# Patient Record
Sex: Male | Born: 1980
Health system: Southern US, Community
[De-identification: ages and names within clinical notes are randomized; demographics above are authoritative.]

## PROBLEM LIST (undated history)

## (undated) DIAGNOSIS — J45909 Unspecified asthma, uncomplicated: Secondary | ICD-10-CM

## (undated) DIAGNOSIS — I10 Essential (primary) hypertension: Secondary | ICD-10-CM

## (undated) DIAGNOSIS — M3390 Dermatopolymyositis, unspecified, organ involvement unspecified: Secondary | ICD-10-CM

## (undated) DIAGNOSIS — M3313 Other dermatomyositis without myopathy: Secondary | ICD-10-CM

## (undated) HISTORY — PX: INCISION AND DRAINAGE OF WOUND: SHX1803

## (undated) HISTORY — PX: TYMPANOSTOMY TUBE PLACEMENT: SHX32

## (undated) HISTORY — PX: HAND SURGERY: SHX662

---
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
## 2004-06-08 ENCOUNTER — Inpatient Hospital Stay (HOSPITAL_COMMUNITY): Admission: AD | Admit: 2004-06-08 | Discharge: 2004-06-11 | Payer: Self-pay | Admitting: Orthopedic Surgery

## 2004-07-07 ENCOUNTER — Encounter: Admission: RE | Admit: 2004-07-07 | Discharge: 2004-08-04 | Payer: Self-pay | Admitting: Orthopedic Surgery

## 2005-01-15 ENCOUNTER — Ambulatory Visit: Payer: Self-pay | Admitting: Internal Medicine

## 2005-04-08 ENCOUNTER — Ambulatory Visit (HOSPITAL_COMMUNITY): Admission: RE | Admit: 2005-04-08 | Discharge: 2005-04-11 | Payer: Self-pay | Admitting: Orthopedic Surgery

## 2005-08-05 ENCOUNTER — Ambulatory Visit: Payer: Self-pay | Admitting: Internal Medicine

## 2006-09-20 ENCOUNTER — Ambulatory Visit: Payer: Self-pay | Admitting: Internal Medicine

## 2007-02-22 ENCOUNTER — Ambulatory Visit: Payer: Self-pay | Admitting: Internal Medicine

## 2007-02-22 ENCOUNTER — Encounter: Payer: Self-pay | Admitting: Internal Medicine

## 2007-02-22 DIAGNOSIS — J45909 Unspecified asthma, uncomplicated: Secondary | ICD-10-CM | POA: Insufficient documentation

## 2007-08-08 ENCOUNTER — Ambulatory Visit: Payer: Self-pay | Admitting: Internal Medicine

## 2007-10-17 ENCOUNTER — Emergency Department (HOSPITAL_COMMUNITY): Admission: EM | Admit: 2007-10-17 | Discharge: 2007-10-17 | Payer: Self-pay | Admitting: Emergency Medicine

## 2008-06-17 ENCOUNTER — Ambulatory Visit: Payer: Self-pay | Admitting: Internal Medicine

## 2008-06-17 DIAGNOSIS — M3390 Dermatopolymyositis, unspecified, organ involvement unspecified: Secondary | ICD-10-CM | POA: Insufficient documentation

## 2008-06-17 DIAGNOSIS — L97909 Non-pressure chronic ulcer of unspecified part of unspecified lower leg with unspecified severity: Secondary | ICD-10-CM | POA: Insufficient documentation

## 2008-06-18 LAB — CONVERTED CEMR LAB
Basophils Absolute: 0.1 10*3/uL (ref 0.0–0.1)
Eosinophils Relative: 2.7 % (ref 0.0–5.0)
HCT: 44 % (ref 39.0–52.0)
Hgb A1c MFr Bld: 5.6 % (ref 4.6–6.0)
Lymphocytes Relative: 16.6 % (ref 12.0–46.0)
Monocytes Relative: 6.8 % (ref 3.0–12.0)
Neutrophils Relative %: 72.6 % (ref 43.0–77.0)
RDW: 11.7 % (ref 11.5–14.6)
WBC: 7.5 10*3/uL (ref 4.5–10.5)

## 2008-08-14 ENCOUNTER — Ambulatory Visit: Payer: Self-pay | Admitting: Internal Medicine

## 2008-08-22 ENCOUNTER — Ambulatory Visit: Payer: Self-pay | Admitting: Internal Medicine

## 2008-08-22 LAB — CONVERTED CEMR LAB: Inflenza A Ag: NEGATIVE

## 2008-09-03 ENCOUNTER — Telehealth (INDEPENDENT_AMBULATORY_CARE_PROVIDER_SITE_OTHER): Payer: Self-pay | Admitting: *Deleted

## 2008-09-04 ENCOUNTER — Telehealth (INDEPENDENT_AMBULATORY_CARE_PROVIDER_SITE_OTHER): Payer: Self-pay | Admitting: *Deleted

## 2009-07-23 ENCOUNTER — Ambulatory Visit: Payer: Self-pay | Admitting: Internal Medicine

## 2010-02-01 ENCOUNTER — Emergency Department (HOSPITAL_COMMUNITY): Admission: EM | Admit: 2010-02-01 | Discharge: 2010-02-01 | Payer: Self-pay | Admitting: Family Medicine

## 2010-02-02 ENCOUNTER — Inpatient Hospital Stay (HOSPITAL_COMMUNITY): Admission: EM | Admit: 2010-02-02 | Discharge: 2010-02-02 | Payer: Self-pay | Admitting: Emergency Medicine

## 2010-03-05 ENCOUNTER — Encounter: Payer: Self-pay | Admitting: Internal Medicine

## 2010-03-24 ENCOUNTER — Encounter: Payer: Self-pay | Admitting: Internal Medicine

## 2010-07-08 ENCOUNTER — Emergency Department (HOSPITAL_COMMUNITY): Admission: EM | Admit: 2010-07-08 | Discharge: 2010-07-08 | Payer: Self-pay | Admitting: Family Medicine

## 2010-07-21 ENCOUNTER — Ambulatory Visit: Payer: Self-pay | Admitting: Internal Medicine

## 2010-10-12 ENCOUNTER — Ambulatory Visit: Payer: Self-pay | Admitting: Family Medicine

## 2010-10-12 ENCOUNTER — Encounter: Payer: Self-pay | Admitting: Family Medicine

## 2010-10-12 DIAGNOSIS — I10 Essential (primary) hypertension: Secondary | ICD-10-CM | POA: Insufficient documentation

## 2010-10-15 ENCOUNTER — Telehealth: Payer: Self-pay | Admitting: Family Medicine

## 2010-10-20 LAB — CONVERTED CEMR LAB
Bilirubin, Direct: 0.1 mg/dL (ref 0.0–0.3)
CO2: 28 meq/L (ref 19–32)
Chloride: 106 meq/L (ref 96–112)
Cholesterol: 131 mg/dL (ref 0–200)
Creatinine, Ser: 1.1 mg/dL (ref 0.4–1.5)
Glucose, Bld: 97 mg/dL (ref 70–99)
HDL: 31.2 mg/dL — ABNORMAL LOW (ref >39.00)
LDL Cholesterol: 90 mg/dL (ref 0–99)
Total Bilirubin: 0.8 mg/dL (ref 0.3–1.2)
Total CHOL/HDL Ratio: 4
Total Protein: 6.8 g/dL (ref 6.0–8.3)
VLDL: 9.4 mg/dL (ref 0.0–40.0)

## 2010-10-29 ENCOUNTER — Ambulatory Visit
Admission: RE | Admit: 2010-10-29 | Discharge: 2010-10-29 | Payer: Self-pay | Source: Home / Self Care | Attending: Internal Medicine | Admitting: Internal Medicine

## 2010-10-29 DIAGNOSIS — K219 Gastro-esophageal reflux disease without esophagitis: Secondary | ICD-10-CM | POA: Insufficient documentation

## 2010-11-03 ENCOUNTER — Encounter: Payer: Self-pay | Admitting: Internal Medicine

## 2010-11-04 ENCOUNTER — Ambulatory Visit
Admission: RE | Admit: 2010-11-04 | Discharge: 2010-11-04 | Payer: Self-pay | Source: Home / Self Care | Attending: Internal Medicine | Admitting: Internal Medicine

## 2010-11-04 ENCOUNTER — Telehealth: Payer: Self-pay | Admitting: Internal Medicine

## 2010-11-24 NOTE — Letter (Signed)
Summary: [facility]  [facility]    Imported By: Lanelle Bal 04/01/2010 12:45:06  _____________________________________________________________________  External Attachment:    Type:   Image     Comment:   External Document

## 2010-11-24 NOTE — Assessment & Plan Note (Signed)
Summary: flu shot/kn  Nurse Visit  CC: Flu shot./kb   Allergies: 1)  ! Sulfa  Orders Added: 1)  Admin 1st Vaccine [90471] 2)  Flu Vaccine 62yrs + [16109]                Flu Vaccine Consent Questions     Do you have a history of severe allergic reactions to this vaccine? no    Any prior history of allergic reactions to egg and/or gelatin? no    Do you have a sensitivity to the preservative Thimersol? no    Do you have a past history of Guillan-Barre Syndrome? no    Do you currently have an acute febrile illness? no    Have you ever had a severe reaction to latex? no    Vaccine information given and explained to patient? yes    Are you currently pregnant? no    Lot Number:[id number]   Exp Date:04/24/2011   Site Given  Left Deltoid IMu Lucious Groves CMA  July 21, 2010 10:45 AM

## 2010-11-26 NOTE — Letter (Signed)
Summary: [facility] Read[facility]  BP Readings/[facility]   Imported By: Lanelle Bal 10/22/2010 09:14:49  _____________________________________________________________________  External Attachment:    Type:   Image     Comment:   External Document

## 2010-11-26 NOTE — Assessment & Plan Note (Signed)
Summary: 2 week followup///sph   Vital Signs:  Patient profile:   30 year old male Weight:      249.6 pounds BMI:     41.69 Temp:     98.3 degrees F oral Pulse rate:   84 / minute Resp:     14 per minute BP sitting:   124 / 76  (right arm) Cuff size:   large  Vitals Entered By: Shonna Chock CMA (October 29, 2010 9:24 AM) CC: Follow-up visit: discuss labs ( copy given)  and cold , Cough Comments Patient compared cuffs: 134/93, p83  (left arm)   Primary Care Provider:  Alfonse Flavors  CC:  Follow-up visit: discuss labs ( copy given)  and cold  and Cough.  History of Present Illness: Cough      This is a 30 year old man who presents with Cough.  The patient reportsparoxysmal  non-productive cough, intermittent wheezing, and exertional dyspnea, but denies shortness of breath, fever, and hemoptysis.  Associated symtpoms include acid reflux symptoms.  The patient denies the following symptoms: cold/URI symptoms and nasal congestion.  The cough is worse with exercise.   Partially effective prior treatments have included PPI, albuterol inhaler, and other asthma medication ( Symbicort ) Cough improved off ACE-I.  Risk factors include history of asthma and history of reflux.   Hypertension Follow-Up      The patient also presents for Hypertension follow-up post change to Amlodipine.  The patient denies lightheadedness, urinary frequency, headaches, edema, and fatigue.  The patient denies the following associated symptoms: chest pain, chest pressure, palpitations, and syncope.  Compliance with medications (by patient report) has been near 100%.  Adjunctive measures currently used by the patient include salt restriction.  BP 130s/90-110.  Current Medications (verified): 1)  Albuterol Mdi Q 4 Hours Prn 2)  Symbicort 160-4.5 Mcg/act Aero (Budesonide-Formoterol Fumarate) .... 2 Puffs Two Times A Day 3)  Prednisone (Pak) 10 Mg Tabs (Prednisone) .... As Directed 4)  Norvasc 5 Mg Tabs (Amlodipine Besylate) ....  Take 1 By Mouth Once Daily  Allergies: 1)  ! Sulfa 2)  ! * Lisinopril  Physical Exam  General:  well-nourished,in no acute distress; alert,appropriate and cooperative throughout examination Nose:  External nasal examination shows no deformity or inflammation. Nasal mucosa are  dry  without lesions or exudates. Mouth:  Oral mucosa and oropharynx without lesions or exudates.  Teeth in good repair. Minimal pharyngeal erythema.  Hoarse Lungs:  Normal respiratory effort, chest expands symmetrically. Lungs are clear to auscultation, no crackles or wheezes but intermittent dry cough Heart:  Normal rate and regular rhythm. S1 and S2 normal without gallop, murmur, click, rub.S4 Abdomen:  Bowel sounds positive,abdomen soft and non-tender without masses, organomegaly or hernias noted. No AAA or bruits Pulses:  R and L carotid,radial,dorsalis pedis and posterior tibial pulses are full and equal bilaterally Extremities:  No clubbing, cyanosis. R ankle edema post strain Neurologic:  alert & oriented X3.     Impression & Recommendations:  Problem # 1:  COUGH (ICD-786.2) probably  due to # 3 & 4   Problem # 2:  HYPERTENSION (ICD-401.9)  His updated medication list for this problem includes:    Norvasc 5 Mg Tabs (Amlodipine besylate) .Marland Kitchen... Take 1 by mouth once daily    Losartan Potassium 100 Mg Tabs (Losartan potassium) .Marland Kitchen... 1/2 once daily if bop averages > 135/85  Problem # 3:  ASTHMA (ICD-493.90)  His updated medication list for this problem includes:  Symbicort 160-4.5 Mcg/act Aero (Budesonide-formoterol fumarate) .Marland Kitchen... 2 puffs two times a day    Prednisone (pak) 10 Mg Tabs (Prednisone) .Marland Kitchen... As directed    Singulair 10 Mg Tabs (Montelukast sodium) .Marland Kitchen... 1 once daily  Problem # 4:  GERD (ICD-530.81)  Complete Medication List: 1)  Albuterol Mdi Q 4 Hours Prn  2)  Symbicort 160-4.5 Mcg/act Aero (Budesonide-formoterol fumarate) .... 2 puffs two times a day 3)  Prednisone (pak) 10 Mg Tabs  (Prednisone) .... As directed 4)  Norvasc 5 Mg Tabs (Amlodipine besylate) .... Take 1 by mouth once daily 5)  Losartan Potassium 100 Mg Tabs (Losartan potassium) .... 1/2 once daily if bop averages > 135/85 6)  Singulair 10 Mg Tabs (Montelukast sodium) .Marland Kitchen.. 1 once daily  Patient Instructions: 1)  Avoid foods high in acid (tomatoes, citrus juices, spicy foods). Avoid eating within two hours of lying down or before exercising. Do not over eat; try smaller more frequent meals. Elevate head of bed twelve inches when sleeping. 2)  Check your Blood Pressure regularly. Your GOAL = AVERAGE of < 135/85. Add Losartan if BP > than goal Prescriptions: SINGULAIR 10 MG TABS (MONTELUKAST SODIUM) 1 once daily  #30 x 11   Entered and Authorized by:   Marga Melnick MD   Signed by:   Marga Melnick MD on 10/29/2010   Method used:   Print then Give to Patient   RxID:   [id number] LOSARTAN POTASSIUM 100 MG TABS (LOSARTAN POTASSIUM) 1/2 once daily IF BOP AVERAGES > 135/85  #30 x 2   Entered and Authorized by:   Marga Melnick MD   Signed by:   Marga Melnick MD on 10/29/2010   Method used:   Print then Give to Patient   RxID:   [id number]    Orders Added: 1)  Est. Patient Level IV [53664]

## 2010-11-26 NOTE — Progress Notes (Signed)
Summary: Reaction to med  Phone Note Refill Request Call back at [phone] Message from:  Patient  Refills Requested: Medication #1:  LISINOPRIL 10 MG TABS 1 by mouth once daily. Pt c/o cough since starting med on Monday. Pt uses [facility] [address]. Pls advise.........Marland KitchenFelecia Deloach CMA  October 15, 2010 9:08 AM    Follow-up for Phone Call        change to norvasc 5 mg #30  1 by mouth once daily 2 refills Follow-up by: Loreen Freud DO,  October 15, 2010 9:29 AM  Additional Follow-up for Phone Call Additional follow up Details #1::        Discuss with patient, Rx sent to pharmacy..........Marland KitchenFelecia Deloach CMA  October 15, 2010 10:05 AM     New Allergies: ! * LISINOPRIL New/Updated Medications: NORVASC 5 MG TABS (AMLODIPINE BESYLATE) Take 1 by mouth once daily New Allergies: ! * LISINOPRILPrescriptions: NORVASC 5 MG TABS (AMLODIPINE BESYLATE) Take 1 by mouth once daily  #30 x 2   Entered by:   Jeremy Johann CMA   Authorized by:   Loreen Freud DO   Signed by:   Jeremy Johann CMA on 10/15/2010   Method used:   Faxed to ...       [facility]  [address] [phone]* (retail)       [address]       [place], [place]  [address]       Ph: [phone]       Fax: [phone]   RxID:   [id number]

## 2010-11-26 NOTE — Assessment & Plan Note (Signed)
Summary: [facility] PT,ELEVATED BP,REFERRED BY DR. R HICKS/RH......   Vital Signs:  Patient profile:   30 year old male Height:      65 inches Weight:      252.8 pounds BMI:     42.22 Pulse rate:   100 / minute Pulse rhythm:   regular BP sitting:   140 / 110  (left arm) Cuff size:   large  Vitals Entered By: Almeta Monas CMA Duncan Dull) (October 12, 2010 1:50 PM) CC: c/o elevated BP   History of Present Illness: Pt here c/o elevated BP when he was at his allergy dr.  Rock Nephew states he has had a slight headache but no CP orSob.     Current Medications (verified): 1)  Albuterol Mdi Q 4 Hours Prn 2)  Symbicort 160-4.5 Mcg/act Aero (Budesonide-Formoterol Fumarate) .... 2 Puffs Two Times A Day 3)  Prednisone (Pak) 10 Mg Tabs (Prednisone) .... As Directed 4)  Lisinopril 10 Mg Tabs (Lisinopril) .Marland Kitchen.. 1 By Mouth Once Daily  Allergies (verified): 1)  ! Sulfa  Past History:  Risk Factors: Exercise: yes (06/17/2008)  Risk Factors: Smoking Status: never (06/17/2008)  Past Medical History: Asthma Hypertension  Family History: Family History Diabetes 1st degree relative Family History Hypertension  Social History: pt lives at home with parents  Review of Systems      See HPI  Physical Exam  General:  Well-developed,well-nourished,in no acute distress; alert,appropriate and cooperative throughout examination Neck:  No deformities, masses, or tenderness noted. Lungs:  Normal respiratory effort, chest expands symmetrically. Lungs are clear to auscultation, no crackles or wheezes. Heart:  normal rate and no murmur.   Extremities:  No clubbing, cyanosis, edema, or deformity noted with normal full range of motion of all joints.   Psych:  Oriented X3 and normally interactive.     Impression & Recommendations:  Problem # 1:  HYPERTENSION (ICD-401.9)  His updated medication list for this problem includes:    Lisinopril 10 Mg Tabs (Lisinopril) .Marland Kitchen... 1 by mouth once  daily  Orders: Venipuncture (16109) TLB-Lipid Panel (80061-LIPID) TLB-BMP (Basic Metabolic Panel-BMET) (80048-METABOL) TLB-Hepatic/Liver Function Pnl (80076-HEPATIC) TLB-A1C / Hgb A1C (Glycohemoglobin) (83036-A1C) EKG w/ Interpretation (93000) Specimen Handling (60454)  BP today: 140/110 Prior BP: 120/90 (08/22/2008)  Problem # 2:  FAMILY HISTORY OF DIABETES MELLITUS (ICD-V18.0)  Orders: Venipuncture (09811) TLB-Lipid Panel (80061-LIPID) TLB-BMP (Basic Metabolic Panel-BMET) (80048-METABOL) TLB-Hepatic/Liver Function Pnl (80076-HEPATIC) TLB-A1C / Hgb A1C (Glycohemoglobin) (83036-A1C) EKG w/ Interpretation (93000) Specimen Handling (91478)  Complete Medication List: 1)  Albuterol Mdi Q 4 Hours Prn  2)  Symbicort 160-4.5 Mcg/act Aero (Budesonide-formoterol fumarate) .... 2 puffs two times a day 3)  Prednisone (pak) 10 Mg Tabs (Prednisone) .... As directed 4)  Lisinopril 10 Mg Tabs (Lisinopril) .Marland Kitchen.. 1 by mouth once daily  Patient Instructions: 1)  Please schedule a follow-up appointment in 2 weeks.  Prescriptions: LISINOPRIL 10 MG TABS (LISINOPRIL) 1 by mouth once daily  #30 x 0   Entered and Authorized by:   Loreen Freud DO   Signed by:   Loreen Freud DO on 10/12/2010   Method used:   Electronically to        [facility] [phone]* (retail)       [address]       [place], [place]  [address]       Ph: [phone]       Fax: [phone]   RxID:   [id number]    Orders Added: 1)  Venipuncture [59563] 2)  TLB-Lipid Panel [80061-LIPID] 3)  TLB-BMP (Basic Metabolic Panel-BMET) [80048-METABOL] 4)  TLB-Hepatic/Liver Function Pnl [80076-HEPATIC] 5)  TLB-A1C / Hgb A1C (Glycohemoglobin) [83036-A1C] 6)  Est. Patient Level IV [16109] 7)  EKG w/ Interpretation [93000] 8)  Specimen Handling [99000]

## 2010-11-26 NOTE — Progress Notes (Signed)
Summary: ABX and cough med alternative  Phone Note Call from Patient   Summary of Call: Patient mom called the office to notify that the patient is allergic to Cephalexin and Tessalon Pearles does not do anything for his cough. They need alternative ABX and cough med sent to the patient pharmacy. Please advise. Initial call taken by: Lucious Groves CMA,  November 04, 2010 3:19 PM  Follow-up for Phone Call        see Rx for Biaxin generic; all the codeine cough syrups  can not be taken if allergic to Sulfa ; Robitussin DM is only option  Follow-up by: Marga Melnick MD,  November 04, 2010 4:11 PM  Additional Follow-up for Phone Call Additional follow up Details #1::        left message on machine to call back to office. Lucious Groves CMA  November 04, 2010 4:47 PM   Patient mother notified. Lucious Groves CMA  November 04, 2010 4:52 PM    New Allergies: ! CEPHALEXIN New/Updated Medications: CLARITHROMYCIN 500 MG XR24H-TAB (CLARITHROMYCIN) 2 once daily with a meal HYDROCOD POLST-CHLORPHEN POLST 10-8 MG/5ML LQCR (HYDROCOD POLST-CHLORPHEN POLST)  New Allergies: ! CEPHALEXINPrescriptions: CLARITHROMYCIN 500 MG XR24H-TAB (CLARITHROMYCIN) 2 once daily with a meal  #20 x 0   Entered by:   Lucious Groves CMA   Authorized by:   Marga Melnick MD   Signed by:   Lucious Groves CMA on 11/04/2010   Method used:   Electronically to        [facility] [phone]* (retail)       [address]       [place], [place]  [address]       Ph: [phone]       Fax: [phone]   RxID:   [id number]

## 2010-11-26 NOTE — Assessment & Plan Note (Signed)
Summary: still coughing and sore throat, hoarse///sph   Vital Signs:  Patient profile:   30 year old male Height:      65 inches (165.10 cm) Weight:      244.13 pounds (110.97 kg) BMI:     40.77 Temp:     98.3 degrees F (36.83 degrees C) oral Resp:     15 per minute BP sitting:   130 / 90  (left arm) Cuff size:   regular  Vitals Entered By: Lucious Groves CMA (November 04, 2010 11:59 AM) CC: C/O still coughing, sore throat, and hoarse./kb, Cough, URI symptoms Is Patient Diabetic? No Pain Assessment Patient in pain? no      Comments Patient notes that he has been somewhat out of breath, but denies fever, HA, and chest pain.   Primary Care Provider:  Alfonse Flavors  CC:  C/O still coughing, sore throat, and hoarse./kb, Cough, and URI symptoms.  History of Present Illness:      This is a 30 year old man who presents with persistent cough & ST.  The patient reports non-productive cough and exertional dyspnea, but denies wheezing and fever. GERD symptoms have improved  significantly.  Associated symtpoms include sore throat as of 01/07.The patient denies nasal congestion, purulent nasal discharge, productive cough, and earache.  The patient denies fever.  The patient denies headache.  The patient denies the following risk factors for Strep sinusitis: unilateral facial pain, tooth pain, and tender adenopathy.    Current Medications (verified): 1)  Albuterol Mdi Q 4 Hours Prn 2)  Symbicort 160-4.5 Mcg/act Aero (Budesonide-Formoterol Fumarate) .... 2 Puffs Two Times A Day 3)  Norvasc 5 Mg Tabs (Amlodipine Besylate) .... Take 1 By Mouth Once Daily 4)  Losartan Potassium 100 Mg Tabs (Losartan Potassium) .... 1/2 Once Daily If Bop Averages > 135/85 5)  Singulair 10 Mg Tabs (Montelukast Sodium) .Marland Kitchen.. 1 Once Daily  Allergies (verified): 1)  ! Sulfa 2)  ! * Lisinopril  Physical Exam  General:  in no acute distress; alert,appropriate and cooperative throughout examination Ears:  External ear exam  shows no significant lesions or deformities.  Otoscopic examination reveals clear canals, tympanic membranes are intact bilaterally without bulging, retraction, inflammation or discharge. Hearing is grossly normal bilaterally. Nose:  External nasal examination shows no deformity or inflammation. Nasal mucosa are pink and moist without lesions or exudates. Mouth:  Oral mucosa and oropharynx without lesions or exudates.  Hoarse.Marked  pharyngeal erythema, tonsil hypertropied, and pharyngeal exudate; L > R.   Lungs:  Normal respiratory effort, chest expands symmetrically. Lungs are clear to auscultation, no crackles or wheezes. Cervical Nodes:  Shotty on L Axillary Nodes:  No palpable lymphadenopathy   Impression & Recommendations:  Problem # 1:  PHARYNGITIS-ACUTE (ICD-462)  Orders: Rapid Strep ([address]): negative Monospot (47829): positive   His updated medication list for this problem includes:    Cephalexin 500 Mg Caps (Cephalexin) .Marland Kitchen... 1 two times a day  Problem # 2:  COUGH (ICD-786.2) multifactorial  Complete Medication List: 1)  Albuterol Mdi Q 4 Hours Prn  2)  Symbicort 160-4.5 Mcg/act Aero (Budesonide-formoterol fumarate) .... 2 puffs two times a day 3)  Norvasc 5 Mg Tabs (Amlodipine besylate) .... Take 1 by mouth once daily 4)  Losartan Potassium 100 Mg Tabs (Losartan potassium) .... 1/2 once daily if bop averages > 135/85 5)  Singulair 10 Mg Tabs (Montelukast sodium) .Marland Kitchen.. 1 once daily 6)  Benzonatate 200 Mg Caps (Benzonatate) .Marland Kitchen.. 1 every 6-8 hrs as needed  for cough 7)  Cephalexin 500 Mg Caps (Cephalexin) .Marland Kitchen.. 1 two times a day  Patient Instructions: 1)  Zicam Lozenges as needed for the throat. 2)  Drink as much fluid as you can tolerate for the next few days. Prescriptions: CEPHALEXIN 500 MG CAPS (CEPHALEXIN) 1 two times a day  #20 x 0   Entered and Authorized by:   Marga Melnick MD   Signed by:   Marga Melnick MD on 11/04/2010   Method used:   Electronically to         [facility] [address] [phone]* (retail)       [address]       [place], [place]  [address]       Ph: [phone]       Fax: [phone]   RxID:   [id number]    Orders Added: 1)  Est. Patient Level III [66440] 2)  Rapid Strep [34742] 3)  Monospot [59563]

## 2010-12-02 NOTE — Letter (Signed)
Summary: [facility]  [facility]   Imported By: Lanelle Bal 11/23/2010 09:47:20  _____________________________________________________________________  External Attachment:    Type:   Image     Comment:   External Document

## 2011-01-13 LAB — BASIC METABOLIC PANEL
Chloride: 109 mEq/L (ref 96–112)
Sodium: 140 mEq/L (ref 135–145)

## 2011-01-13 LAB — CBC
Hemoglobin: 14.7 g/dL (ref 13.0–17.0)
MCHC: 32.7 g/dL (ref 30.0–36.0)
RDW: 13.5 % (ref 11.5–15.5)
WBC: 8.9 10*3/uL (ref 4.0–10.5)

## 2011-01-13 LAB — POCT I-STAT, CHEM 8
Glucose, Bld: 114 mg/dL — ABNORMAL HIGH (ref 70–99)
Hemoglobin: 16.7 g/dL (ref 13.0–17.0)
Sodium: 143 mEq/L (ref 135–145)
TCO2: 25 mmol/L (ref 0–100)

## 2011-01-13 LAB — DIFFERENTIAL
Basophils Absolute: 0.1 10*3/uL (ref 0.0–0.1)
Eosinophils Absolute: 0.4 10*3/uL (ref 0.0–0.7)
Lymphocytes Relative: 17 % (ref 12–46)
Lymphs Abs: 1.5 10*3/uL (ref 0.7–4.0)

## 2011-01-21 ENCOUNTER — Encounter: Payer: Self-pay | Admitting: Internal Medicine

## 2011-01-22 ENCOUNTER — Encounter: Payer: Self-pay | Admitting: Internal Medicine

## 2011-01-22 ENCOUNTER — Ambulatory Visit (INDEPENDENT_AMBULATORY_CARE_PROVIDER_SITE_OTHER): Payer: PRIVATE HEALTH INSURANCE | Admitting: Internal Medicine

## 2011-01-22 DIAGNOSIS — R079 Chest pain, unspecified: Secondary | ICD-10-CM

## 2011-01-22 DIAGNOSIS — K219 Gastro-esophageal reflux disease without esophagitis: Secondary | ICD-10-CM

## 2011-01-22 DIAGNOSIS — J45909 Unspecified asthma, uncomplicated: Secondary | ICD-10-CM

## 2011-01-22 NOTE — Patient Instructions (Signed)
Please use the mild shampoo to cleanse  your nares while showering each day. Use the Eucerin twice a day as needed for drying. Do not use  Eucerin in nose  at bedtime.  Please go to [facility] for mitral valve prolapse. This condition can be associated with atypical chest pain. It's not associated with any significant heart disease and has good long-term prognosis.

## 2011-01-22 NOTE — Progress Notes (Signed)
  Subjective:    Patient ID: Evan Burns, male    DOB: 01-27-1981, 30 y.o.   MRN: [id number]  Epistaxis  The bleeding has been from the left nare. This is a recurrent problem. Episode onset: 08/2010. The problem has been unchanged. The bleeding is associated with nothing. He has tried nothing for the symptoms. His past medical history is significant for allergies. There is no history of a bleeding disorder, colds or sinus problems. asthma ; GERD  Chest Pain  This is a recurrent problem. The current episode started more than 1 month ago (2nd week in 11/2010). The onset quality is sudden. The problem occurs intermittently (10-15X in past 6 weeks). The problem has been unchanged. The pain is present in the lateral region. Pain scale: 4.5 / 10. The pain is mild. The quality of the pain is described as pressure. The pain does not radiate. Associated symptoms include lower extremity edema. Pertinent negatives include no abdominal pain, back pain, claudication, cough, diaphoresis, dizziness, exertional chest pressure, hemoptysis, irregular heartbeat, leg pain, nausea, near-syncope, palpitations, PND or shortness of breath. Associated symptoms comments: He has right ankle edema following a basketball injury in September 2011. Past medical history comments: asthma ; GERD Prior workup: no evaluation to date.      Review of Systems  Constitutional: Negative for diaphoresis.  HENT: Positive for nosebleeds.   Respiratory: Negative for cough, hemoptysis and shortness of breath.   Cardiovascular: Positive for chest pain. Negative for palpitations, claudication, leg swelling, PND and near-syncope.  Gastrointestinal: Negative for nausea and abdominal pain.  Musculoskeletal: Negative for back pain.  Neurological: Negative for dizziness.       Objective:   Physical Exam  Constitutional: He appears well-nourished.  HENT:  Nose: Nose normal.  Mouth/Throat: Oropharynx is clear and moist. No oropharyngeal  exudate.       No erythema of oropharynx  Cardiovascular: Normal rate and regular rhythm.  Exam reveals no gallop and no friction rub.   No murmur heard.      Mitral click @ apex.  Pulses are intact; no bruits are noted. No edema is noted at the right ankle. Homans sign is negative bilaterally.    Abdominal: Soft. Bowel sounds are normal. He exhibits no distension and no mass. There is no tenderness. There is no guarding.  Lymphadenopathy:    He has no cervical adenopathy.  Neurological: He is alert.  Skin: Skin is warm and dry. He is not diaphoretic.  Psychiatric: He has a normal mood and affect.          Assessment & Plan:  #1 epistaxes recurrent. No obvious nasal mucosal  lesions are present at this time. This is most likely related to bleeding from drying of the nasal mucosa particularly based on the time component. Specifically it started in the winter.  #2 chest pain, atypical clinically and mitral valve prolapse is suggested. EKG was within normal limits.  #3 asthma, controlled  #4 GERD, controlled.  Plan: #1 chest x-ray  If this is negative then I stress echo would be considered to assess the possibility of mitral valve prolapse which can cause atypical chest pain.  Eucerin is recommended topically twice a day as needed for nasal drying. The should not be instilled into the nares at night.

## 2011-01-28 ENCOUNTER — Ambulatory Visit (INDEPENDENT_AMBULATORY_CARE_PROVIDER_SITE_OTHER)
Admission: RE | Admit: 2011-01-28 | Discharge: 2011-01-28 | Disposition: A | Discharge status: Home / Self Care | Payer: PRIVATE HEALTH INSURANCE | Source: Ambulatory Visit | Attending: Internal Medicine | Admitting: Internal Medicine

## 2011-01-28 DIAGNOSIS — J45909 Unspecified asthma, uncomplicated: Secondary | ICD-10-CM

## 2011-01-28 DIAGNOSIS — R079 Chest pain, unspecified: Secondary | ICD-10-CM

## 2011-01-31 ENCOUNTER — Other Ambulatory Visit: Payer: Self-pay | Admitting: Family Medicine

## 2011-02-01 NOTE — Telephone Encounter (Signed)
Renew x 6 months

## 2011-02-01 NOTE — Telephone Encounter (Signed)
[facility] patient please advise if this is ok to fill     KP

## 2011-02-02 NOTE — Telephone Encounter (Signed)
This is a duplicate.

## 2011-03-12 NOTE — Op Note (Signed)
Evan Burns, Evan Burns              ACCOUNT NO.:  [id number]   MEDICAL RECORD NO.:  [id number]          PATIENT TYPE:  OIB   LOCATION:  2899                         FACILITY:  [facility]   PHYSICIAN:  Nadara Mustard, MD     DATE OF BIRTH:  1981-02-14   DATE OF PROCEDURE:  04/08/2005  DATE OF DISCHARGE:                                 OPERATIVE REPORT   PREOP DIAGNOSIS:  Abscess of left long finger, dorsal PIP joint.   POSTOP DIAGNOSIS:  Abscess of left long finger, dorsal PIP joint.   PROCEDURE:  Excisional irrigation and debridement, left long finger.   SURGEON.:  Aldean Baker, M.D.   ANESTHESIA:  LMA.   ESTIMATED BLOOD LOSS:  Minimal.   DRAINS:  One Penrose drain.   ANTIBIOTICS:  Kefzol 1 gram after cultures were obtained.   TOURNIQUET TIME:  None.   DISPOSITION:  To PACU in stable condition.   INDICATION FOR PROCEDURE:  The patient is a 30 year old gentleman with  dermatomyositis with multiple subcutaneous calcific masses. The patient has  had recurrent episodes of infections in his fingers and presents at this  time with recurrent infection in his left long finger. The patient has  failed conservative care with p.o. antibiotics with Omnicef 300 mg p.o.  b.i.d. He also has undergone a sterile aspiration of the ulcer. After  failure of conservative care, the patient wished to proceed with surgical  debridement at this time. The risks and benefits were discussed including  infection, neurovascular injury, nonhealing of the wound, and need for  additional surgery. The patient states he understands and wishes to proceed  at this time.   DESCRIPTION OF PROCEDURE:  The patient was brought to OR room five and  underwent general LMA anesthetic. After adequate level of anesthesia was  obtained, the patient's left upper extremity was prepped using DuraPrep and  draped into a sterile field. A Z-incision was made and centered over the PIP  dorsally of the left long finger. The abscess  was drained. Cultures were  obtained. The wound was irrigated with normal saline. This did not extend to  the joint. The extensor tendons were intact. After cleansing and  debridement, the wound was closed over a Penrose drain with 4-0 nylon. The  wound was covered with Adaptic orthopedic sponges, Kerlix and Coban. The  patient was extubated and taken to the PACU in stable condition. We will  place him on vancomycin pending culture results.   PLAN:  Follow up in the office in 2 weeks.       MVD/MEDQ  D:  04/08/2005  T:  04/08/2005  Job:  [id number]

## 2011-03-12 NOTE — Op Note (Signed)
NAMECARR, Evan Burns                        ACCOUNT NO.:  [id number]   MEDICAL RECORD NO.:  [id number]                   PATIENT TYPE:  OIB   LOCATION:  2899                                 FACILITY:  [facility]   PHYSICIAN:  Nadara Mustard, M.D.                DATE OF BIRTH:  1981-02-19   DATE OF PROCEDURE:  06/08/2004  DATE OF DISCHARGE:                                 OPERATIVE REPORT   PREOPERATIVE DIAGNOSIS:  Abscess right index finger.   POSTOPERATIVE DIAGNOSIS:  Abscess right index finger.   PROCEDURE:  Irrigation and debridement right index finger.   SURGEON:  Nadara Mustard, M.D.   ANESTHESIA:  LMA.   ESTIMATED BLOOD LOSS:  Minimal.   ANTIBIOTICS:  1 gram Kefzol.   TOURNIQUET TIME:  Esmarch at the wrist for approximately 20 minutes.   DISPOSITION:  To the PACU in stable condition, cultures obtained x 2.   INDICATIONS FOR PROCEDURE:  The patient is a 30 year old gentleman with  dermatomyositis.  The patient developed swelling and cellulitis over the  index finger this weekend.  He was started on p.o. antibiotics on Sunday,  was seen in the office 24 hours after starting his p.o. antibiotics with  worsening cellulitis and increasing swelling and was brought to the  operating room for irrigation and debridement.  The risks and benefits of  surgery were discussed including infection, neurovascular injury, stiffness,  loss of the finger, and need for additional surgery.  The patient and his  mother state they understands and wish to proceed at this time.   DESCRIPTION OF PROCEDURE:  The patient was brought to OR room 1 and  underwent a general LMA anesthetic.  After an adequate level of anesthesia  was obtained, the patient's right upper extremity was prepped using alcohol  and Hibiclens and draped into a sterile field.  A Z-type incision was made  on the dorsum of the hand after an Esmarch was wrapped around the wrist for  tourniquet control.  This was made over the PIP  joint with the apex ulnarly.  After incising the skin, a large purulent abscess was identified.  This was  debrided, fibrinous calcific tissue was also debrided.  The wound was  irrigated with normal saline.  After debridement and cleansing of the wound,  the Esmarch was released and hemostasis was obtained.  The wound was then  closed loosely over a TLS drain with 4-0 nylon.  The wound was covered with  Adaptic, orthopedic sponges, sterile Kerlix and a Coban dressing.  The  patient was extubated and taken to the PACU in stable condition.  The plan  is for IV antibiotics, discharge once sensitivities are obtained.  Plan for  repeat irrigation and debridement as needed.  Nadara Mustard, M.D.   MVD/MEDQ  D:  06/08/2004  T:  06/08/2004  Job:  [id number]

## 2011-06-13 ENCOUNTER — Inpatient Hospital Stay (INDEPENDENT_AMBULATORY_CARE_PROVIDER_SITE_OTHER)
Admission: RE | Admit: 2011-06-13 | Discharge: 2011-06-13 | Disposition: A | Discharge status: Home / Self Care | Payer: PRIVATE HEALTH INSURANCE | Source: Ambulatory Visit | Attending: Family Medicine | Admitting: Family Medicine

## 2011-06-13 ENCOUNTER — Ambulatory Visit (INDEPENDENT_AMBULATORY_CARE_PROVIDER_SITE_OTHER): Payer: PRIVATE HEALTH INSURANCE

## 2011-06-13 DIAGNOSIS — M79609 Pain in unspecified limb: Secondary | ICD-10-CM

## 2011-08-25 ENCOUNTER — Ambulatory Visit (INDEPENDENT_AMBULATORY_CARE_PROVIDER_SITE_OTHER): Payer: Self-pay | Admitting: *Deleted

## 2011-08-25 DIAGNOSIS — Z23 Encounter for immunization: Secondary | ICD-10-CM

## 2011-12-06 ENCOUNTER — Encounter: Payer: Self-pay | Admitting: Internal Medicine

## 2011-12-06 ENCOUNTER — Ambulatory Visit (INDEPENDENT_AMBULATORY_CARE_PROVIDER_SITE_OTHER): Payer: Managed Care, Other (non HMO) | Admitting: Internal Medicine

## 2011-12-06 ENCOUNTER — Telehealth: Payer: Self-pay | Admitting: Internal Medicine

## 2011-12-06 DIAGNOSIS — J209 Acute bronchitis, unspecified: Secondary | ICD-10-CM

## 2011-12-06 DIAGNOSIS — J019 Acute sinusitis, unspecified: Secondary | ICD-10-CM

## 2011-12-06 MED ORDER — CLARITHROMYCIN ER 500 MG PO TB24: 1000.0000 mg | ORAL_TABLET | Freq: Every day | ORAL | Status: AC

## 2011-12-06 NOTE — Patient Instructions (Signed)
Nasal cleansing in the shower as discussed. Make sure that all residual soap is removed to prevent irritation.Plain Mucinex for thick secretions ;force NON dairy fluids . Use a Neti pot daily as needed for sinus congestion

## 2011-12-06 NOTE — Telephone Encounter (Signed)
To: [facility] [place] [place] (Daytime Triage) Fax: [phone] From: [facility] Date/ Time: 12/06/2011 10:03 AM Taken By: Thomasena Edis, CSR Caller: Karren Burly Facility: not collected Patient: Evan Burns DOB: October 07, 1981 Phone: [phone] Reason for Call: [name] says the medication that was sent in is too expensive and needs the doctor to call in something else. Regarding Appointment: Appt Date: Appt Time: Unknown Provider: Reason: Details: Outcome

## 2011-12-06 NOTE — Telephone Encounter (Signed)
Dr.Hopper please advise 

## 2011-12-06 NOTE — Telephone Encounter (Signed)
Doxycycline 100 mg twice a day dispense 20

## 2011-12-06 NOTE — Progress Notes (Signed)
  Subjective:    Patient ID: Evan Burns, male    DOB: [birth year], 31 y.o.   MRN: [id number]  HPI Respiratory tract infection Onset/symptoms:last week as ST Exposures (illness/environmental/extrinsic):no Progression of symptoms:to head congestion & dry cough Treatments/response:fluids/minimal benefit Present symptoms: Fever/chills/sweats:2/8 Frontal headache:no Facial pain:no Nasal purulence:some but improved Sore throat:not now Dental pain:no Lymphadenopathyno Wheezing/shortness of breath:no Cough/sputum/hemoptysissome yellow; > vol from head Pleuritic pain:no Past medical history: Seasonal allergies:occasionally/asthma:yes , no exacerbation. PMH cough with ACE-I Smoking history:never           Review of Systems his reflux is quiescent; he specifically denies significant dysphagia,melena , & rectal bleeding     Objective:   Physical Exam General appearance:good health ;well nourished; no acute distress or increased work of breathing is present.  No  lymphadenopathy about the head, neck, or axilla noted.   Eyes: No conjunctival inflammation or lid edema is present.   Ears:  External ear exam shows mild scarringdeformities.  Otoscopic examination reveals clear canals, tympanic membranes are intact bilaterally without bulging, retraction, inflammation or discharge. Nose:  External nasal examination shows no deformity or inflammation. Nasal mucosa are dry without lesions or exudates. No septal dislocation or deviation.No obstruction to airflow.   Oral exam: Dental hygiene is good; lips and gums are healthy appearing.There is no oropharyngeal erythema or exudate noted.     Heart:  Normal rate and regular rhythm. S1 and S2 normal without gallop, murmur, click, rub or other extra sounds.   Lungs:Chest clear to auscultation; no wheezes, rhonchi,rales ,or rubs present.No increased work of breathing.    Extremities:  No cyanosis, edema, or clubbing  noted    Skin: Warm &  dry ; Dermatomyositis scarring           Assessment & Plan:    #1 cough due to bronchitis  #2 residual rhinosinusitis  Plan: See orders and recommendations

## 2011-12-07 MED ORDER — DOXYCYCLINE HYCLATE 100 MG PO TABS: 100.0000 mg | ORAL_TABLET | Freq: Two times a day (BID) | ORAL | Status: AC

## 2011-12-07 NOTE — Telephone Encounter (Signed)
Discuss with patient mom and dad Rx sent.

## 2012-02-09 ENCOUNTER — Other Ambulatory Visit: Payer: Self-pay | Admitting: Internal Medicine

## 2012-02-22 ENCOUNTER — Ambulatory Visit: Payer: Managed Care, Other (non HMO) | Admitting: Internal Medicine

## 2012-07-25 ENCOUNTER — Ambulatory Visit (INDEPENDENT_AMBULATORY_CARE_PROVIDER_SITE_OTHER): Payer: Managed Care, Other (non HMO)

## 2012-07-25 DIAGNOSIS — Z23 Encounter for immunization: Secondary | ICD-10-CM

## 2012-08-24 ENCOUNTER — Encounter (HOSPITAL_COMMUNITY): Payer: Self-pay | Admitting: Emergency Medicine

## 2012-08-24 ENCOUNTER — Emergency Department (INDEPENDENT_AMBULATORY_CARE_PROVIDER_SITE_OTHER)
Admission: EM | Admit: 2012-08-24 | Discharge: 2012-08-24 | Disposition: A | Discharge status: Home / Self Care | Payer: Self-pay | Source: Home / Self Care | Attending: Emergency Medicine | Admitting: Emergency Medicine

## 2012-08-24 ENCOUNTER — Emergency Department (INDEPENDENT_AMBULATORY_CARE_PROVIDER_SITE_OTHER): Payer: Self-pay

## 2012-08-24 DIAGNOSIS — S9030XA Contusion of unspecified foot, initial encounter: Secondary | ICD-10-CM

## 2012-08-24 DIAGNOSIS — IMO0002 Reserved for concepts with insufficient information to code with codable children: Secondary | ICD-10-CM

## 2012-08-24 HISTORY — DX: Other dermatomyositis without myopathy: M33.13

## 2012-08-24 HISTORY — DX: Unspecified asthma, uncomplicated: J45.909

## 2012-08-24 HISTORY — DX: Dermatopolymyositis, unspecified, organ involvement unspecified: M33.90

## 2012-08-24 HISTORY — DX: Essential (primary) hypertension: I10

## 2012-08-24 MED ORDER — ACETAMINOPHEN-CODEINE #3 300-30 MG PO TABS: 1.0000 | ORAL_TABLET | Freq: Four times a day (QID) | ORAL | Status: DC | PRN

## 2012-08-24 NOTE — ED Provider Notes (Signed)
History     CSN: [id number]  Arrival date & time 08/24/12  1652   First MD Initiated Contact with Patient 08/24/12 1658      Chief Complaint  Patient presents with  . Ankle Pain    (Consider location/radiation/quality/duration/timing/severity/associated sxs/prior treatment) HPI Comments: Left ankle pain, onset this am.  Reports hitting left lateral ankle on refrigerator this am.  Visible swelling and pain.  Patient using crutches he has from home, previous incident. No numbeness, no weakness.   "Have problems my left ankle before and a half strained his tear some of the ligaments of my right ankle as well", it hurts when I walk on my ankle in a swollen"       Patient is a 31 y.o. male presenting with ankle pain. The history is provided by the patient.  Ankle Pain  The incident occurred 12 to 24 hours ago. The incident occurred at home. The injury mechanism was a direct blow. The pain is present in the left ankle. The quality of the pain is described as sharp. The pain is at a severity of 5/10. The pain is moderate. Associated symptoms include inability to bear weight. Pertinent negatives include no numbness, no loss of motion, no muscle weakness, no loss of sensation and no tingling.    Past Medical History  Diagnosis Date  . Asthma   . Dermatomyositis   . Hypertension     Past Surgical History  Procedure Date  . Hand surgery     Family History  Problem Relation Age of Onset  . Diabetes    . Hypertension      History  Substance Use Topics  . Smoking status: Never Smoker   . Smokeless tobacco: Not on file  . Alcohol Use: No      Review of Systems  Constitutional: Positive for activity change.  Musculoskeletal: Positive for joint swelling and gait problem. Negative for myalgias.  Skin: Negative for pallor and rash.  Neurological: Negative for tingling, weakness and numbness.    Allergies  Sulfonamide derivatives; Cephalexin; and Lisinopril  Home Medications     Current Outpatient Rx  Name Route Sig Dispense Refill  . ACETAMINOPHEN-CODEINE #3 300-30 MG PO TABS Oral Take 1-2 tablets by mouth every 6 (six) hours as needed for pain. 20 tablet 0  . ALBUTEROL SULFATE IN Inhalation Inhale into the lungs every 4 (four) hours as needed.      Marland Kitchen AMLODIPINE BESYLATE 5 MG PO TABS  take 1 tablet by mouth once daily 30 tablet 5  . BUDESONIDE-FORMOTEROL FUMARATE 160-4.5 MCG/ACT IN AERO Inhalation Inhale 2 puffs into the lungs 2 (two) times daily.      Marland Kitchen LOSARTAN POTASSIUM 100 MG PO TABS  take 1 tablet by mouth once daily 30 tablet 5  . MONTELUKAST SODIUM 10 MG PO TABS Oral Take 10 mg by mouth daily.      Marland Kitchen FISH OIL 1000 MG PO CAPS Oral Take 1,000 mg by mouth. 1-2 by mouth daily       BP 166/96  Pulse 67  Temp 98.3 F (36.8 C) (Oral)  Resp 20  SpO2 98%  Physical Exam  Nursing note and vitals reviewed. Constitutional: Vital signs are normal. He appears well-developed and well-nourished. He does not have a sickly appearance. He does not appear ill. No distress.  Musculoskeletal: He exhibits tenderness.       Left foot: He exhibits decreased range of motion, tenderness, bony tenderness, swelling and deformity. He exhibits no crepitus  and no laceration.       Feet:  Neurological: He is alert.  Skin: No rash noted. No erythema.    ED Course  Procedures (including critical care time)  Labs Reviewed - No data to display Dg Ankle Complete Left  08/24/2012  *RADIOLOGY REPORT*  Clinical Data: Trauma.  Pain.  LEFT ANKLE COMPLETE - 3+ VIEW  Comparison: 06/13/2011.  Findings: Degenerative changes with prominent well corticated bony structure inferior to the lateral malleolus unchanged from prior exam.  No acute fracture or dislocation.  IMPRESSION: No acute fracture  or dislocation.  Please see above.   Original Report Authenticated By: Lacy Duverney, M.D.      1. Contusion of ankle or foot, left       MDM  Contusion to lateral aspect of left ankle. Ankle  joint feels stable no increased laxity. Have encouraged patient to keep affected ankle elevated, mild compression with a Ace wrap given Tylenol No. 3 for pain and discomfort to use crutches for 3 days and to followup with his primary care Dr. to consider physical therapy if pain was to persist beyond 7 days.        Jimmie Molly, MD 08/24/12 Zollie Pee

## 2012-08-24 NOTE — ED Notes (Signed)
4  Inch  Ace  To  l  ankle

## 2012-08-24 NOTE — ED Notes (Signed)
Left ankle pain, onset this am.  Reports hitting left lateral ankle on refrigerator this am.  Visible swelling and pain.  Patient using crutches he has from home, previous incident

## 2012-09-08 ENCOUNTER — Ambulatory Visit (INDEPENDENT_AMBULATORY_CARE_PROVIDER_SITE_OTHER): Payer: 59 | Admitting: Family Medicine

## 2012-09-08 ENCOUNTER — Encounter: Payer: Self-pay | Admitting: Family Medicine

## 2012-09-08 VITALS — BP 130/82 | HR 95 | Temp 98.6°F | Wt 205.0 lb

## 2012-09-08 DIAGNOSIS — J209 Acute bronchitis, unspecified: Secondary | ICD-10-CM

## 2012-09-08 DIAGNOSIS — J45901 Unspecified asthma with (acute) exacerbation: Secondary | ICD-10-CM

## 2012-09-08 DIAGNOSIS — J45909 Unspecified asthma, uncomplicated: Secondary | ICD-10-CM

## 2012-09-08 MED ORDER — AZITHROMYCIN 250 MG PO TABS: ORAL_TABLET | ORAL | Status: DC

## 2012-09-08 MED ORDER — GUAIFENESIN-CODEINE 100-10 MG/5ML PO SYRP: 10.0000 mL | ORAL_SOLUTION | Freq: Three times a day (TID) | ORAL | Status: DC | PRN

## 2012-09-08 NOTE — Assessment & Plan Note (Signed)
New.  Given pt's underlying lung dz will start abx.  Cough syrup prn.  Instructed on need for increased albuterol use.  Reviewed supportive care and red flags that should prompt return.  Pt expressed understanding and is in agreement w/ plan.

## 2012-09-08 NOTE — Progress Notes (Signed)
  Subjective:    Patient ID: Evan Burns, male    DOB: 07-Jan-1981, 31 y.o.   MRN: [id number]  HPI URI- sxs started Tuesday w/ sore throat.  Now w/ cough, nasal congestion, chest congestion.  Cough is dry.  Nasal drainage is yellow, thick.  No ear pain.  + HA last night.  No fevers.  + sick contacts.  Hx of asthma.  Using albuterol PRN but has not had to increase use.   Review of Systems For ROS see HPI     Objective:   Physical Exam  Vitals reviewed. Constitutional: He appears well-developed and well-nourished. No distress.  HENT:  Head: Normocephalic and atraumatic.  Right Ear: Tympanic membrane normal.  Left Ear: Tympanic membrane normal.  Nose: No mucosal edema or rhinorrhea. Right sinus exhibits no maxillary sinus tenderness and no frontal sinus tenderness. Left sinus exhibits no maxillary sinus tenderness and no frontal sinus tenderness.  Mouth/Throat: Mucous membranes are normal. No oropharyngeal exudate, posterior oropharyngeal edema or posterior oropharyngeal erythema.  Eyes: Conjunctivae normal and EOM are normal. Pupils are equal, round, and reactive to light.  Neck: Normal range of motion. Neck supple.  Cardiovascular: Normal rate, regular rhythm and normal heart sounds.   Pulmonary/Chest: Effort normal and breath sounds normal. No respiratory distress. He has no wheezes.       + hacking cough  Lymphadenopathy:    He has no cervical adenopathy.  Skin: Skin is warm and dry.          Assessment & Plan:

## 2012-09-08 NOTE — Patient Instructions (Addendum)
This is a bronchitis Start the Zpack as directed Codeine cough syrup as needed for cough Add Mucinex to thin your congestion Drink plenty of fluids REST! Hang in there!!!

## 2012-09-15 ENCOUNTER — Ambulatory Visit (INDEPENDENT_AMBULATORY_CARE_PROVIDER_SITE_OTHER): Payer: 59 | Admitting: Internal Medicine

## 2012-09-15 ENCOUNTER — Encounter: Payer: Self-pay | Admitting: Internal Medicine

## 2012-09-15 VITALS — BP 140/82 | HR 81 | Temp 98.2°F | Wt 208.4 lb

## 2012-09-15 DIAGNOSIS — J45909 Unspecified asthma, uncomplicated: Secondary | ICD-10-CM

## 2012-09-15 MED ORDER — PREDNISONE 20 MG PO TABS: 20.0000 mg | ORAL_TABLET | Freq: Two times a day (BID) | ORAL | Status: DC

## 2012-09-15 MED ORDER — BUDESONIDE-FORMOTEROL FUMARATE 160-4.5 MCG/ACT IN AERO: 2.0000 | INHALATION_SPRAY | Freq: Two times a day (BID) | RESPIRATORY_TRACT | Status: DC

## 2012-09-15 NOTE — Progress Notes (Signed)
  Subjective:    Patient ID: Evan Burns, male    DOB: May 20, 1981, 31 y.o.   MRN: [id number]  HPI  Symptoms began 09/05/12 for dry cough which progressed to include head congestion with yellow discharge. This was followed by a sore throat. He took the Zithromax and the codeine-containing cough syrup with some benefit.  He continues to have a cough without significant sputum production. The cough was aggravated by exercise last night. He has some scant nasal drainage without frank purulence      Review of Systems He is not having fever, chills, or sweats.     Objective:   Physical Exam General appearance:good health ;well nourished; no acute distress or increased work of breathing is present.  No  lymphadenopathy about the head, neck, or axilla noted.   Eyes: No conjunctival inflammation or lid edema is present. There is no scleral icterus.  Ears:  External ear exam shows no significant lesions or deformities.  Otoscopic examination reveals clear canals, tympanic membranes are intact bilaterally without bulging, retraction, inflammation or discharge.  Nose:  External nasal examination shows no deformity or inflammation. Nasal mucosa are pink and moist without lesions or exudates. No septal dislocation or deviation.No obstruction to airflow.   Oral exam: Dental hygiene is good; lips and gums are healthy appearing.There is no oropharyngeal erythema or exudate noted.   Neck:  No deformities,  masses, or tenderness noted.     Heart:  Normal rate and regular rhythm. S1 and S2 normal without gallop, murmur, click, rub or other extra sounds.   Lungs:Chest clear to auscultation; no wheezes, rhonchi,rales ,or rubs present.No increased work of breathing.   Dry cough  Extremities:  No cyanosis, edema, or clubbing  noted    Skin: Warm & dry           Assessment & Plan:  #1asthmaexacerbation Plan: See orders and recommendations

## 2012-09-15 NOTE — Patient Instructions (Addendum)
Symbicort one-2  inhalations every 12 hours; gargle and spit after use  

## 2012-09-19 ENCOUNTER — Encounter: Payer: Self-pay | Admitting: Family Medicine

## 2012-09-19 ENCOUNTER — Ambulatory Visit (INDEPENDENT_AMBULATORY_CARE_PROVIDER_SITE_OTHER): Payer: Self-pay | Admitting: Family Medicine

## 2012-09-19 VITALS — BP 132/80 | HR 75 | Temp 98.2°F | Wt 208.8 lb

## 2012-09-19 DIAGNOSIS — R059 Cough, unspecified: Secondary | ICD-10-CM

## 2012-09-19 DIAGNOSIS — J45909 Unspecified asthma, uncomplicated: Secondary | ICD-10-CM

## 2012-09-19 DIAGNOSIS — J329 Chronic sinusitis, unspecified: Secondary | ICD-10-CM

## 2012-09-19 DIAGNOSIS — R05 Cough: Secondary | ICD-10-CM

## 2012-09-19 MED ORDER — MOXIFLOXACIN HCL 400 MG PO TABS: 400.0000 mg | ORAL_TABLET | Freq: Every day | ORAL | Status: DC

## 2012-09-19 MED ORDER — ALBUTEROL SULFATE (2.5 MG/3ML) 0.083% IN NEBU: 2.5000 mg | INHALATION_SOLUTION | Freq: Four times a day (QID) | RESPIRATORY_TRACT | Status: DC | PRN

## 2012-09-19 NOTE — Progress Notes (Signed)
  Subjective:     Evan Burns is a 31 y.o. male here for evaluation of a cough. Onset of symptoms was 2 weeks ago. Symptoms have been gradually worsening since that time. The cough is dry and is aggravated by nothing. Associated symptoms include: postnasal drip. Patient does not have a history of asthma. Patient does not have a hist    Subjective:     Evan Burns is a 31 y.o. male who presents for evaluation of possible sinusitis. Symptoms include congestion, nasal congestion, productive cough with  green colored sputum, purulent nasal discharge, shortness of breath, sinus pressure, sore throat and wheezing. Onset of symptoms was 2 weeks ago, and has been gradually worsening since that time. Treatment to date: antibiotics and steroids.  The following portions of the patient's history were reviewed and updated as appropriate: allergies, current medications, past family history, past medical history, past social history, past surgical history and problem list.  Review of Systems Pertinent items are noted in HPI.   Objective:    BP 132/80  Pulse 75  Temp 98.2 F (36.8 C) (Oral)  Wt 208 lb 12.8 oz (94.711 kg)  SpO2 97% General appearance: alert, cooperative, appears stated age and no distress Ears: normal TM's and external ear canals both ears Nose: green discharge, mild congestion, turbinates red, swollen, sinus tenderness bilateral Throat: abnormal findings: mild oropharyngeal erythema Neck: mild anterior cervical adenopathy, supple, symmetrical, trachea midline and thyroid not enlarged, symmetric, no tenderness/mass/nodules Lungs: diminished breath sounds bibasilar Heart: S1, S2 normal Lymph nodes: Cervical adenopathy: b/l, milds   Assessment:    asthma and sinusitis   Plan:    Discussed the diagnosis and treatment of sinusitis. Suggested symptomatic OTC remedies. Nasal saline spray for congestion. avelox per orders. Nasal steroids per orders. Follow up as  needed. Follow up with PCP in 2 weeks or as needed.  Finish steroids depomedrol cxr

## 2012-09-19 NOTE — Patient Instructions (Signed)
Asthma Attack Prevention  HOW CAN ASTHMA BE PREVENTED?  Currently, there is no way to prevent asthma from starting. However, you can take steps to control the disease and prevent its symptoms after you have been diagnosed. Learn about your asthma and how to control it. Take an active role to control your asthma by working with your caregiver to create and follow an asthma action plan. An asthma action plan guides you in taking your medicines properly, avoiding factors that make your asthma worse, tracking your level of asthma control, responding to worsening asthma, and seeking emergency care when needed. To track your asthma, keep records of your symptoms, check your peak flow number using a peak flow meter (handheld device that shows how well air moves out of your lungs), and get regular asthma checkups.   Other ways to prevent asthma attacks include:   Use medicines as your caregiver directs.   Identify and avoid things that make your asthma worse (as much as you can).   Keep track of your asthma symptoms and level of control.   Get regular checkups for your asthma.   With your caregiver, write a detailed plan for taking medicines and managing an asthma attack. Then be sure to follow your action plan. Asthma is an ongoing condition that needs regular monitoring and treatment.   Identify and avoid asthma triggers. A number of outdoor allergens and irritants (pollen, mold, cold air, air pollution) can trigger asthma attacks. Find out what causes or makes your asthma worse, and take steps to avoid those triggers (see below).   Monitor your breathing. Learn to recognize warning signs of an attack, such as slight coughing, wheezing or shortness of breath. However, your lung function may already decrease before you notice any signs or symptoms, so regularly measure and record your peak airflow with a home peak flow meter.   Identify and treat attacks early. If you act quickly, you're less likely to have a  severe attack. You will also need less medicine to control your symptoms. When your peak flow measurements decrease and alert you to an upcoming attack, take your medicine as instructed, and immediately stop any activity that may have triggered the attack. If your symptoms do not improve, get medical help.   Pay attention to increasing quick-relief inhaler use. If you find yourself relying on your quick-relief inhaler (such as albuterol), your asthma is not under control. See your caregiver about adjusting your treatment.  IDENTIFY AND CONTROL FACTORS THAT MAKE YOUR ASTHMA WORSE  A number of common things can set off or make your asthma symptoms worse (asthma triggers). Keep track of your asthma symptoms for several weeks, detailing all the environmental and emotional factors that are linked with your asthma. When you have an asthma attack, go back to your asthma diary to see which factor, or combination of factors, might have contributed to it. Once you know what these factors are, you can take steps to control many of them.   Allergies: If you have allergies and asthma, it is important to take asthma prevention steps at home. Asthma attacks (worsening of asthma symptoms) can be triggered by allergies, which can cause temporary increased inflammation of your airways. Minimizing contact with the substance to which you are allergic will help prevent an asthma attack.  Animal Dander:    Some people are allergic to the flakes of skin or dried saliva from animals with fur or feathers. Keep these pets out of your home.   If   you can't keep a pet outdoors, keep the pet out of your bedroom and other sleeping areas at all times, and keep the door closed.   Remove carpets and furniture covered with cloth from your home. If that is not possible, keep the pet away from fabric-covered furniture and carpets.  Dust Mites:   Many people with asthma are allergic to dust mites. Dust mites are tiny bugs that are found in every  home, in mattresses, pillows, carpets, fabric-covered furniture, bedcovers, clothes, stuffed toys, fabric, and other fabric-covered items.   Cover your mattress in a special dust-proof cover.   Cover your pillow in a special dust-proof cover, or wash the pillow each week in hot water. Water must be hotter than 130 F to kill dust mites. Cold or warm water used with detergent and bleach can also be effective.   Wash the sheets and blankets on your bed each week in hot water.   Try not to sleep or lie on cloth-covered cushions.   Call ahead when traveling and ask for a smoke-free hotel room. Bring your own bedding and pillows, in case the hotel only supplies feather pillows and down comforters, which may contain dust mites and cause asthma symptoms.   Remove carpets from your bedroom and those laid on concrete, if you can.   Keep stuffed toys out of the bed, or wash the toys weekly in hot water or cooler water with detergent and bleach.  Cockroaches:   Many people with asthma are allergic to the droppings and remains of cockroaches.   Keep food and garbage in closed containers. Never leave food out.   Use poison baits, traps, powders, gels, or paste (for example, boric acid).   If a spray is used to kill cockroaches, stay out of the room until the odor goes away.  Indoor Mold:   Fix leaky faucets, pipes, or other sources of water that have mold around them.   Clean moldy surfaces with a cleaner that has bleach in it.  Pollen and Outdoor Mold:   When pollen or mold spore counts are high, try to keep your windows closed.   Stay indoors with windows closed from late morning to afternoon, if you can. Pollen and some mold spore counts are highest at that time.   Ask your caregiver whether you need to take or increase anti-inflammatory medicine before your allergy season starts.  Irritants:    Tobacco smoke is an irritant. If you smoke, ask your caregiver how you can quit. Ask family members to quit  smoking, too. Do not allow smoking in your home or car.   If possible, do not use a wood-burning stove, kerosene heater, or fireplace. Minimize exposure to all sources of smoke, including incense, candles, fires, and fireworks.   Try to stay away from strong odors and sprays, such as perfume, talcum powder, hair spray, and paints.   Decrease humidity in your home and use an indoor air cleaning device. Reduce indoor humidity to below 60 percent. Dehumidifiers or central air conditioners can do this.   Try to have someone else vacuum for you once or twice a week, if you can. Stay out of rooms while they are being vacuumed and for a short while afterward.   If you vacuum, use a dust mask from a hardware store, a double-layered or microfilter vacuum cleaner bag, or a vacuum cleaner with a HEPA filter.   Sulfites in foods and beverages can be irritants. Do not drink beer or   wine, or eat dried fruit, processed potatoes, or shrimp if they cause asthma symptoms.   Cold air can trigger an asthma attack. Cover your nose and mouth with a scarf on cold or windy days.   Several health conditions can make asthma more difficult to manage, including runny nose, sinus infections, reflux disease, psychological stress, and sleep apnea. Your caregiver will treat these conditions, as well.   Avoid close contact with people who have a cold or the flu, since your asthma symptoms may get worse if you catch the infection from them. Wash your hands thoroughly after touching items that may have been handled by people with a respiratory infection.   Get a flu shot every year to protect against the flu virus, which often makes asthma worse for days or weeks. Also get a pneumonia shot once every five to 10 years.  Drugs:   Aspirin and other painkillers can cause asthma attacks. 10% to 20% of people with asthma have sensitivity to aspirin or a group of painkillers called non-steroidal anti-inflammatory drugs (NSAIDS), such as ibuprofen  and naproxen. These drugs are used to treat pain and reduce fevers. Asthma attacks caused by any of these medicines can be severe and even fatal. These drugs must be avoided in people who have known aspirin sensitive asthma. Products with acetaminophen are considered safe for people who have asthma. It is important that people with aspirin sensitivity read labels of all over-the-counter drugs used to treat pain, colds, coughs, and fever.   Beta blockers and ACE inhibitors are other drugs which you should discuss with your caregiver, in relation to your asthma.  ALLERGY SKIN TESTING   Ask your asthma caregiver about allergy skin testing or blood testing (RAST test) to identify the allergens to which you are sensitive. If you are found to have allergies, allergy shots (immunotherapy) for asthma may help prevent future allergies and asthma. With allergy shots, small doses of allergens (substances to which you are allergic) are injected under your skin on a regular schedule. Over a period of time, your body may become used to the allergen and less responsive with asthma symptoms. You can also take measures to minimize your exposure to those allergens.  EXERCISE   If you have exercise-induced asthma, or are planning vigorous exercise, or exercise in cold, humid, or dry environments, prevent exercise-induced asthma by following your caregiver's advice regarding asthma treatment before exercising.  Document Released: 09/29/2009 Document Revised: 01/03/2012 Document Reviewed: 09/29/2009  [employer] Patient Information 2013 [employer].

## 2012-09-20 MED ORDER — METHYLPREDNISOLONE ACETATE 80 MG/ML IJ SUSP
80.0000 mg | Freq: Once | INTRAMUSCULAR | Status: AC
  Administered 2012-09-19: 80 mg via INTRAMUSCULAR

## 2012-09-20 MED ORDER — ALBUTEROL SULFATE (2.5 MG/3ML) 0.083% IN NEBU
2.5000 mg | INHALATION_SOLUTION | Freq: Once | RESPIRATORY_TRACT | Status: AC
  Administered 2012-09-20: 2.5 mg via RESPIRATORY_TRACT

## 2012-09-20 NOTE — Addendum Note (Signed)
Addended by: Arnette Norris on: 09/20/2012 08:13 AM   Modules accepted: Orders

## 2013-02-13 ENCOUNTER — Encounter: Payer: Self-pay | Admitting: Internal Medicine

## 2013-02-13 ENCOUNTER — Ambulatory Visit (INDEPENDENT_AMBULATORY_CARE_PROVIDER_SITE_OTHER): Payer: 59 | Admitting: Internal Medicine

## 2013-02-13 VITALS — BP 118/76 | HR 75 | Temp 98.3°F | Wt 219.0 lb

## 2013-02-13 DIAGNOSIS — J45901 Unspecified asthma with (acute) exacerbation: Secondary | ICD-10-CM

## 2013-02-13 DIAGNOSIS — J45909 Unspecified asthma, uncomplicated: Secondary | ICD-10-CM

## 2013-02-13 MED ORDER — PREDNISONE 20 MG PO TABS: 20.0000 mg | ORAL_TABLET | Freq: Two times a day (BID) | ORAL | Status: DC

## 2013-02-13 MED ORDER — BENZONATATE 200 MG PO CAPS: 200.0000 mg | ORAL_CAPSULE | Freq: Three times a day (TID) | ORAL | Status: DC | PRN

## 2013-02-13 NOTE — Patient Instructions (Addendum)
Symbicort one - two inhalations every nnnn12 hours; gargle and spit after use

## 2013-02-13 NOTE — Progress Notes (Signed)
  Subjective:    Patient ID: Evan Burns, male    DOB: 06-30-1981, 32 y.o.   MRN: [id number]  HPI  02/06/13 he developed a dry cough which has persisted. This is been associated with some shortness of breath and wheezing. Albuterol as nebulizer every 4-6 hours initially was helpful but has been less so recently. He's been using the Symbicort 1 spray in repeating it 4 hours later with some benefit  He had some sweats; these had scant yellow nasal discharge.    Review of Systems  He denies significant frontal headache, facial pain, dental pain, sore throat, otic pain, or otic discharge.  He has not had significant extrinsic symptoms of itchy, watery eyes or sneezing.  There's been no associated chills or fever.  There's been no significant reflux as a trigger.    Objective:   Physical Exam General appearance:well nourished; no acute distress or increased work of breathing is present.  No  lymphadenopathy about the head, neck, or axilla noted.   Eyes: No conjunctival inflammation or lid edema is present.   Ears:  External ear exam shows no significant lesions or deformities.  Otoscopic examination reveals clear canals, tympanic membranes are intact bilaterally without bulging, retraction, inflammation or discharge.  Nose:  External nasal examination shows no deformity or inflammation. Nasal mucosa are pink and moist without lesions or exudates. No septal dislocation or deviation.No obstruction to airflow.   Oral exam: Dental hygiene is good; lips and gums are healthy appearing.There is no oropharyngeal erythema or exudate noted.   Neck:  No deformities,  masses, or tenderness noted.     Heart:  Normal rate and regular rhythm. S1 and S2 normal without gallop, murmur, click, rub or other extra sounds.   Lungs:Chest clear to auscultation; no wheezes, rhonchi,rales ,or rubs present.No increased work of breathing.  Decreased BS. Brassy cough  Extremities:  No cyanosis or clubbing   Noted. Trace sock line edema   Skin: Warm & dry . Chronic dermatomyositis skin lesions         Assessment & Plan:  #1 asthma flare without definitive trigger as he has no significant fever or purulent secretions and extrinsic symptoms are essentially absent. Concern is the albuterol use. Emphasis will be placed on employed the Symbicort 2 puffs every 12 hours until cough is resolved.  Plan: See orders and recommendations

## 2013-05-14 ENCOUNTER — Encounter (HOSPITAL_COMMUNITY): Payer: Self-pay | Admitting: Emergency Medicine

## 2013-05-14 ENCOUNTER — Emergency Department (HOSPITAL_COMMUNITY)
Admission: EM | Admit: 2013-05-14 | Discharge: 2013-05-14 | Disposition: A | Discharge status: Home / Self Care | Payer: 59 | Attending: Emergency Medicine | Admitting: Emergency Medicine

## 2013-05-14 ENCOUNTER — Emergency Department (HOSPITAL_COMMUNITY): Payer: 59

## 2013-05-14 DIAGNOSIS — I10 Essential (primary) hypertension: Secondary | ICD-10-CM | POA: Insufficient documentation

## 2013-05-14 DIAGNOSIS — Y929 Unspecified place or not applicable: Secondary | ICD-10-CM | POA: Insufficient documentation

## 2013-05-14 DIAGNOSIS — Z888 Allergy status to other drugs, medicaments and biological substances status: Secondary | ICD-10-CM | POA: Insufficient documentation

## 2013-05-14 DIAGNOSIS — Z882 Allergy status to sulfonamides status: Secondary | ICD-10-CM | POA: Insufficient documentation

## 2013-05-14 DIAGNOSIS — Y939 Activity, unspecified: Secondary | ICD-10-CM | POA: Insufficient documentation

## 2013-05-14 DIAGNOSIS — W010XXA Fall on same level from slipping, tripping and stumbling without subsequent striking against object, initial encounter: Secondary | ICD-10-CM | POA: Insufficient documentation

## 2013-05-14 DIAGNOSIS — Z79899 Other long term (current) drug therapy: Secondary | ICD-10-CM | POA: Insufficient documentation

## 2013-05-14 DIAGNOSIS — M25531 Pain in right wrist: Secondary | ICD-10-CM

## 2013-05-14 DIAGNOSIS — J45909 Unspecified asthma, uncomplicated: Secondary | ICD-10-CM | POA: Insufficient documentation

## 2013-05-14 DIAGNOSIS — M25539 Pain in unspecified wrist: Secondary | ICD-10-CM | POA: Insufficient documentation

## 2013-05-14 MED ORDER — TRAMADOL HCL 50 MG PO TABS: 50.0000 mg | ORAL_TABLET | Freq: Four times a day (QID) | ORAL | Status: DC | PRN

## 2013-05-14 NOTE — ED Notes (Signed)
Patient states he fell backwards and broke his fall with his right hand/wrist.   Patient states pain in his right thumb and right wrist when trying to move.

## 2013-05-14 NOTE — ED Provider Notes (Signed)
History    CSN: [id number] Arrival date & time 05/14/13  1004  First MD Initiated Contact with Patient 05/14/13 1038     Chief Complaint  Patient presents with  . Wrist Pain   (Consider location/radiation/quality/duration/timing/severity/associated sxs/prior Treatment) HPI Comments: Patient reports he tripped and fell backwards onto his outstretched right hand.  Reports pain in his radial wrist and thumb.  Pain is constant, worse with movement and palpation, better with rest.  Has taken nothing for pain. Denies other injury.  Denies hitting head or LOC.  Denies pain in his elbow or anywhere else.  Denies weakness or numbness of the hand.    Patient is a 32 y.o. male presenting with wrist pain. The history is provided by the patient.  Wrist Pain Pertinent negatives include no headaches, joint swelling, numbness or weakness.   Past Medical History  Diagnosis Date  . Asthma   . Dermatomyositis   . Hypertension    Past Surgical History  Procedure Laterality Date  . Hand surgery     Family History  Problem Relation Age of Onset  . Diabetes    . Hypertension     History  Substance Use Topics  . Smoking status: Never Smoker   . Smokeless tobacco: Not on file  . Alcohol Use: No    Review of Systems  Musculoskeletal: Negative for joint swelling.  Skin: Negative for color change and wound.  Neurological: Negative for syncope, weakness, numbness and headaches.    Allergies  Sulfonamide derivatives; Cephalexin; and Lisinopril  Home Medications   Current Outpatient Rx  Name  Route  Sig  Dispense  Refill  . albuterol (PROVENTIL HFA;VENTOLIN HFA) 108 (90 BASE) MCG/ACT inhaler   Inhalation   Inhale 2 puffs into the lungs every 6 (six) hours as needed for wheezing or shortness of breath.         Marland Kitchen amLODipine (NORVASC) 5 MG tablet      take 1 tablet by mouth once daily   30 tablet   5   . budesonide-formoterol (SYMBICORT) 160-4.5 MCG/ACT inhaler   Inhalation  Inhale 2 puffs into the lungs 2 (two) times daily.   1 Inhaler   11   . COD LIVER OIL PO   Oral   Take 1 capsule by mouth daily.         Marland Kitchen losartan (COZAAR) 100 MG tablet      take 1 tablet by mouth once daily   30 tablet   5   . Omega-3 Fatty Acids (FISH OIL) 1000 MG CAPS   Oral   Take 1,000 mg by mouth daily.          Marland Kitchen albuterol (PROVENTIL) (2.5 MG/3ML) 0.083% nebulizer solution   Nebulization   Take 3 mLs (2.5 mg total) by nebulization every 6 (six) hours as needed for wheezing.   150 mL   11   . traMADol (ULTRAM) 50 MG tablet   Oral   Take 1 tablet (50 mg total) by mouth every 6 (six) hours as needed for pain.   10 tablet   0    BP 135/77  Pulse 80  Temp(Src) 97.7 F (36.5 C) (Oral)  Resp 20  Ht 5\' 7"  (1.702 m)  Wt 210 lb (95.255 kg)  BMI 32.88 kg/m2  SpO2 96% Physical Exam  Nursing note and vitals reviewed. Constitutional: He appears well-developed and well-nourished. No distress.  HENT:  Head: Normocephalic and atraumatic.  Neck: Neck supple.  Pulmonary/Chest: Effort normal.  Musculoskeletal:       Right wrist: He exhibits decreased range of motion and tenderness. He exhibits no swelling, no effusion, no crepitus, no deformity and no laceration.       Right hand: He exhibits tenderness. He exhibits normal range of motion, no bony tenderness, normal capillary refill, no deformity, no laceration and no swelling. Normal sensation noted. Normal strength noted.       Hands: Neurological: He is alert.  Skin: He is not diaphoretic.    ED Course  Procedures (including critical care time) Labs Reviewed - No data to display Dg Wrist Complete Right  05/14/2013   *RADIOLOGY REPORT*  Clinical Data: Injured right wrist.  RIGHT WRIST - COMPLETE 3+ VIEW  Comparison: None  Findings: The joint spaces are maintained.  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  Minimal degenerative changes.  No acute fracture.  Soft tissue calcifications  are noted around the thumb.  IMPRESSION: No acute bony findings.   Original Report Authenticated By: Rudie Meyer, M.D.   Dg Finger Thumb Right  05/14/2013   *RADIOLOGY REPORT*  Clinical Data: Injury.  Fell.  Pain, swelling.  RIGHT THUMB 2+V  Comparison: None  Findings: There is no evidence for acute fracture or subluxation. Calcific densities overlie the soft tissues of the hand, at the second metacarpal phalangeal joint and adjacent to the first metacarpal.  Findings could be related to soft tissue calcification or less likely, foreign bodies.  IMPRESSION:  1. No evidence for acute  abnormality. 2. Soft tissue calcification.   Original Report Authenticated By: Norva Pavlov, M.D.   1. Right wrist pain     MDM  Pt with FOOSH, backwards onto right hand.  Pain in radial wrist and base of thumb.  Neurovascularly intact.  Likely sprained.  Placed in velcro splint.  RICE and ultram for home.  PCP follow up.  Discussed all results with patient.  Pt given return precautions.  Pt verbalizes understanding and agrees with plan.       Trixie Dredge, PA-C 05/14/13 1353

## 2013-05-14 NOTE — ED Provider Notes (Signed)
Medical screening examination/treatment/procedure(s) were performed by non-physician practitioner and as supervising physician I was immediately available for consultation/collaboration.  Analeah Brame, MD 05/14/13 1445 

## 2013-07-24 ENCOUNTER — Ambulatory Visit (INDEPENDENT_AMBULATORY_CARE_PROVIDER_SITE_OTHER): Payer: 59

## 2013-07-24 DIAGNOSIS — Z23 Encounter for immunization: Secondary | ICD-10-CM

## 2013-07-26 ENCOUNTER — Ambulatory Visit (INDEPENDENT_AMBULATORY_CARE_PROVIDER_SITE_OTHER): Payer: 59 | Admitting: Family Medicine

## 2013-07-26 ENCOUNTER — Encounter: Payer: Self-pay | Admitting: Family Medicine

## 2013-07-26 VITALS — BP 140/88 | HR 89 | Temp 98.5°F | Wt 220.0 lb

## 2013-07-26 DIAGNOSIS — R05 Cough: Secondary | ICD-10-CM

## 2013-07-26 DIAGNOSIS — R059 Cough, unspecified: Secondary | ICD-10-CM

## 2013-07-26 DIAGNOSIS — J019 Acute sinusitis, unspecified: Secondary | ICD-10-CM

## 2013-07-26 MED ORDER — GUAIFENESIN-CODEINE 100-10 MG/5ML PO SYRP: ORAL_SOLUTION | ORAL | Status: DC

## 2013-07-26 MED ORDER — AZITHROMYCIN 250 MG PO TABS: ORAL_TABLET | ORAL | Status: DC

## 2013-07-26 NOTE — Progress Notes (Signed)
  Subjective:     Evan Burns is a 32 y.o. male who presents for evaluation of symptoms of a URI. Symptoms include congestion, nasal congestion, post nasal drip, productive cough with  yellow colored sputum, purulent nasal discharge and sinus pressure. Onset of symptoms was 6 days ago, and has been gradually worsening since that time. Treatment to date: none.  The following portions of the patient's history were reviewed and updated as appropriate: allergies, current medications, past family history, past medical history, past social history, past surgical history and problem list.  Review of Systems Pertinent items are noted in HPI.   Objective:    BP 140/88  Pulse 89  Temp(Src) 98.5 F (36.9 C) (Oral)  Wt 220 lb (99.791 kg)  BMI 34.45 kg/m2  SpO2 98% General appearance: alert, cooperative, appears stated age and no distress Ears: normal TM's and external ear canals both ears Nose: yellow discharge, moderate congestion, turbinates red, swollen, sinus tenderness bilateral Throat: abnormal findings: moderate oropharyngeal erythema and pnd Neck: mild anterior cervical adenopathy, no JVD, supple, symmetrical, trachea midline and thyroid not enlarged, symmetric, no tenderness/mass/nodules Lungs: clear to auscultation bilaterally Heart: S1, S2 normal   Assessment:    sinusitis   Plan:    Discussed the diagnosis and treatment of sinusitis. Suggested symptomatic OTC remedies. Nasal saline spray for congestion. Zithromax per orders. Nasal steroids per orders. Follow up as needed.

## 2013-07-26 NOTE — Patient Instructions (Signed)

## 2013-09-13 ENCOUNTER — Ambulatory Visit (INDEPENDENT_AMBULATORY_CARE_PROVIDER_SITE_OTHER): Payer: 59 | Admitting: Internal Medicine

## 2013-09-13 ENCOUNTER — Encounter: Payer: Self-pay | Admitting: Internal Medicine

## 2013-09-13 VITALS — BP 127/78 | HR 76 | Temp 98.0°F | Ht 67.5 in | Wt 221.6 lb

## 2013-09-13 DIAGNOSIS — T148XXA Other injury of unspecified body region, initial encounter: Secondary | ICD-10-CM

## 2013-09-13 DIAGNOSIS — Z23 Encounter for immunization: Secondary | ICD-10-CM

## 2013-09-13 NOTE — Patient Instructions (Signed)
Please report warning signs as we discussed. Worrisome would be change in color or size, increased pain, fever, or pus production. 

## 2013-09-13 NOTE — Progress Notes (Signed)
  Subjective:    Patient ID: Evan Burns, male    DOB: 25-May-1981, 33 y.o.   MRN: [id number]  HPI   He slammed his right palm in a car door in mid  September. Initially he had pain, swelling, and black pigment of the fingernail. He treated with cleansing and hydrogen peroxide.  He has been stable until Monday 11/17 when he noticed some green or yellow discoloration of the fingernail.    Review of Systems  He denies fever, chills, sweats, or purulence from the nail. There's been no red streaks going up the finger.     Objective:   Physical Exam  He appears healthy and well-nourished in no distress  There is a 15 x 9 mm ecchymotic lesion under the right thumbnail approximately. At the nailbed aches there is dehiscence or nail deficit.  There is no tenderness to palpation over the nail itself or the DIP joint. There is full range of motion of the DIP joint.  There is no evidence of cellulitis over the dorsal thumb.  He has no epitrochlear or axillary lymphadenopathy.        Assessment & Plan:  #1  post traumatic hematoma right thumbnail; no evidence of cellulitis or abscess.  Plan: Tetanus shot updated.  Most likely scenario discussed with him. Nail removal would simply be cosmetic and is not clinically indicated at this time.

## 2013-09-13 NOTE — Progress Notes (Signed)
Pre visit review using our clinic review tool, if applicable. No additional management support is needed unless otherwise documented below in the visit note. 

## 2013-09-14 ENCOUNTER — Ambulatory Visit: Payer: 59 | Admitting: Internal Medicine

## 2013-11-23 ENCOUNTER — Telehealth: Payer: Self-pay

## 2013-11-23 NOTE — Telephone Encounter (Signed)
Left message for call back Non identifiable  Flu--06/2013 Tdap--08/2013

## 2013-11-27 ENCOUNTER — Encounter: Payer: 59 | Admitting: Internal Medicine

## 2013-11-28 ENCOUNTER — Ambulatory Visit (INDEPENDENT_AMBULATORY_CARE_PROVIDER_SITE_OTHER): Payer: 59 | Admitting: Internal Medicine

## 2013-11-28 ENCOUNTER — Encounter: Payer: Self-pay | Admitting: Internal Medicine

## 2013-11-28 VITALS — BP 140/60 | HR 89 | Temp 98.3°F | Resp 12 | Wt 225.2 lb

## 2013-11-28 DIAGNOSIS — J0110 Acute frontal sinusitis, unspecified: Secondary | ICD-10-CM

## 2013-11-28 DIAGNOSIS — I10 Essential (primary) hypertension: Secondary | ICD-10-CM

## 2013-11-28 MED ORDER — AMLODIPINE BESYLATE 5 MG PO TABS: ORAL_TABLET | ORAL | Status: DC

## 2013-11-28 MED ORDER — LOSARTAN POTASSIUM 100 MG PO TABS: ORAL_TABLET | ORAL | Status: DC

## 2013-11-28 MED ORDER — AMOXICILLIN 500 MG PO CAPS: 500.0000 mg | ORAL_CAPSULE | Freq: Three times a day (TID) | ORAL | Status: DC

## 2013-11-28 NOTE — Telephone Encounter (Signed)
Cancelled.  

## 2013-11-28 NOTE — Patient Instructions (Signed)
Plain Mucinex (NOT D) for thick secretions ;force NON dairy fluids .   Nasal cleansing in the shower as discussed with lather of mild shampoo.After 10 seconds wash off lather while  exhaling through nostrils. Make sure that all residual soap is removed to prevent irritation.  Flonase OR Nasacort AQ 1 spray in each nostril twice a day as needed. Use the "crossover" technique into opposite nostril spraying toward opposite ear @ 45 degree angle, not straight up into nostril.  Use a Neti pot daily only  as needed for significant sinus congestion; going from open side to congested side . Plain Allegra (NOT D )  160 daily , Loratidine 10 mg , OR Zyrtec 10 mg @ bedtime  as needed for itchy eyes & sneezing.  My retirement date is 10/25/2015. Whatever you need from me over the  next six - 18 months; I will strive my utmost to provide. I am sorry if the letter you received led you to believe I would not pursue  an orderly plan to transition your care to another primary care physician over that eighteen month period;but the letter you received was not my original letter.Our focus is your needs, not mine.  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 I believe these students will become primary care practitioners of the future. I feel I can make an impact by helping them develop  diagnostic and treatment skills to treat you and me!. I would certainly recommend considering transferring to the [facility] facility closest to your home. You could research the physicians at that site on the [facility] Website.  If you can,I would also recommend reviewing your data in My Chart to make sure that information is as  correct as possible. These two recommendations will help guarantee optimal continuity of care. I cannot recommend a specific MD for you as there will be new physicians joining the practice and I have not had a chance to work with  many physicians at the [facility] site to which I am transferring. [facility] will be moving to a modern facility  with  full service Xray & lab services on [address]. My fellow physicians at [facility] are skilled practitioners and compassionate individuals.I shall miss working directly with them. I recommend them to you with pride if that site is an option for you.I have not elected to move to that site as it would add 20 - 30 minute  to my commute on [address] or [address] each day. Again, please let me know what I and [facility] can do for you. [employer]

## 2013-11-28 NOTE — Progress Notes (Signed)
   Subjective:    Patient ID: Evan Burns, male    DOB: 12/23/1980, 33 y.o.   MRN: [id number]  HPI  Symptoms began 11/25/13 as a nonproductive cough and nasal congestion. He's also had chills and fever up to 100.5.  This morning he was diaphoretic and felt presyncopal  He describes a frontal sinus pain as well as bitemporal discomfort. He is a yellow-green nasal discharge  Cough has been productive of scant sputum. Has had mild shortness of breath.  Other less severe symptoms including watery eyes and sneezing.   He has used Mucinex and Afrin with temporary benefit.   Review of Systems He is not having facial pain, dental pain, otic pain, or otic discharge.  The shortness of breath is not associated with wheezing      Objective:   Physical Exam General appearance:well nourished; no acute distress or increased work of breathing is present.  No  lymphadenopathy about the head, neck, or axilla noted.   Eyes: No conjunctival inflammation or lid edema is present.  Ears:  External ear exam shows no significant lesions or deformities.  Otoscopic examination reveals clear canals, tympanic membranes are intact bilaterally without bulging, retraction, inflammation or discharge.  Nose:  External nasal examination shows no deformity or inflammation. Nasal mucosa are dry without lesions or exudates. No septal dislocation or deviation.No obstruction to airflow.   Oral exam: Dental hygiene is good; lips and gums are healthy appearing.There is no oropharyngeal erythema or exudate noted.   Neck:  No deformities,  masses, or tenderness noted.   Supple with full range of motion without pain.   Heart:  Normal rate and regular rhythm. S1 and S2 normal without gallop, murmur, click, rub or other extra sounds.   Lungs:Chest clear to auscultation; no wheezes, rhonchi,rales ,or rubs present.No increased work of breathing.  Decreased BS  Extremities:  No cyanosis, edema, or clubbing  noted     Skin: Warm & damp         Assessment & Plan:  #1 rhinosinusitis without significant bronchitis  Plan: Nasal hygiene interventions discussed. See prescription medications

## 2013-11-28 NOTE — Progress Notes (Signed)
Pre visit review using our clinic review tool, if applicable. No additional management support is needed unless otherwise documented below in the visit note. 

## 2013-11-30 ENCOUNTER — Telehealth: Payer: Self-pay | Admitting: Internal Medicine

## 2013-11-30 NOTE — Telephone Encounter (Signed)
Relevant patient education mailed to patient.  

## 2013-12-04 ENCOUNTER — Telehealth: Payer: Self-pay | Admitting: Internal Medicine

## 2013-12-04 NOTE — Telephone Encounter (Signed)
Pt request sample for Symbicort. Please call pt if there is any sample

## 2013-12-04 NOTE — Telephone Encounter (Signed)
Patient notified

## 2013-12-07 ENCOUNTER — Encounter: Payer: Self-pay | Admitting: Internal Medicine

## 2013-12-07 ENCOUNTER — Ambulatory Visit (INDEPENDENT_AMBULATORY_CARE_PROVIDER_SITE_OTHER): Payer: 59 | Admitting: Internal Medicine

## 2013-12-07 VITALS — BP 120/80 | HR 74 | Temp 98.1°F | Resp 13 | Wt 226.0 lb

## 2013-12-07 DIAGNOSIS — R059 Cough, unspecified: Secondary | ICD-10-CM

## 2013-12-07 DIAGNOSIS — J45909 Unspecified asthma, uncomplicated: Secondary | ICD-10-CM

## 2013-12-07 DIAGNOSIS — R05 Cough: Secondary | ICD-10-CM

## 2013-12-07 DIAGNOSIS — R058 Other specified cough: Secondary | ICD-10-CM

## 2013-12-07 MED ORDER — BUDESONIDE-FORMOTEROL FUMARATE 160-4.5 MCG/ACT IN AERO: 2.0000 | INHALATION_SPRAY | Freq: Two times a day (BID) | RESPIRATORY_TRACT | Status: DC

## 2013-12-07 NOTE — Patient Instructions (Signed)
Albuterol is a rescue inhaler which should be used as infrequently as possible; it should never be used more than 1-2 puffs every 4 hours. It may  be used 15-30 minutes before exercise if that is also  a trigger for your asthma.   If it is required more than 2-3 times per week; the asthma is not well controlled. Symbicort , Dulera , Advair or other maintenance agent should be considered to control smooth muscle spasm and airway inflammation  and to prevent adverse effects from excess albuterol use.Those adverse effects can include health or life threatening heart rhythm irregularities. .Marland Kitchen

## 2013-12-07 NOTE — Progress Notes (Signed)
Pre visit review using our clinic review tool, if applicable. No additional management support is needed unless otherwise documented below in the visit note. 

## 2013-12-07 NOTE — Progress Notes (Signed)
   Subjective:    Patient ID: Evan Burns, male    DOB: 12/29/1980, 33 y.o.   MRN: [id number]  HPI  He completes his amoxicillin in the morning. All symptoms have improved except for paroxysmal coughing. He is unsure of any specific trigger for the cough; it is nonproductive. It is not associated with wheezing or shortness of breath.  In the past triggers for cough include pollen, dust, and smoke  He has been using his Symbicort 2 puffs every 12 hours. He's been implementing the aggressive nasal hygiene program.  He is not on ACE inhibitor    Review of Systems  He specifically denies frontal headache, facial pain, nasal purulence, dental pain, sore throat, otic pain, otic discharge  He also denies fever, chills, or sweats  No GERD     Objective:   Physical Exam General appearance:good health ;well nourished; no acute distress or increased work of breathing is present.  No  lymphadenopathy about the head, neck, or axilla noted.   Eyes: No conjunctival inflammation or lid edema is present. There is no scleral icterus.  Ears:  External ear exam shows no significant lesions or deformities.  Otoscopic examination reveals clear canals, tympanic membranes are intact bilaterally without bulging, retraction, inflammation or discharge.  Nose:  External nasal examination shows no deformity or inflammation. Nasal mucosa are pink and moist without lesions or exudates. No septal dislocation or deviation.No obstruction to airflow.   Oral exam: Dental hygiene is good; lips and gums are healthy appearing.There is no oropharyngeal erythema or exudate noted.   Neck:  No deformities,  masses, or tenderness noted.     Heart:  Normal rate and regular rhythm. S1 and S2 normal without gallop, murmur, click, rub or other extra sounds.   Lungs:Chest clear to auscultation; no wheezes, rhonchi,rales ,or rubs present.No increased work of breathing. Dry cough  Extremities:  No cyanosis, edema, or  clubbing  noted    Skin: Warm & dry        Assessment & Plan:  #1 cough, paroxysmal See AVS

## 2013-12-21 ENCOUNTER — Telehealth: Payer: Self-pay | Admitting: Internal Medicine

## 2013-12-21 DIAGNOSIS — J0110 Acute frontal sinusitis, unspecified: Secondary | ICD-10-CM

## 2013-12-21 MED ORDER — AMOXICILLIN 500 MG PO CAPS: 500.0000 mg | ORAL_CAPSULE | Freq: Three times a day (TID) | ORAL | Status: DC

## 2013-12-21 NOTE — Telephone Encounter (Signed)
Rx sent to the pharmacy by e-script per Dr. Alwyn RenHopper.  Pt's mother aware.//AB/CMA

## 2013-12-21 NOTE — Telephone Encounter (Signed)
Patient is calling to request a refill on his Amoxicillin. Please advise.

## 2014-04-21 IMAGING — CR DG ANKLE COMPLETE 3+V*L*
3 series · 3 of 3 positions shown · non-contrast
Comparison: 06/13/2011.

CLINICAL DATA: Trauma.  Pain.

LEFT ANKLE COMPLETE - 3+ VIEW

[view not recorded (1 of 3)]
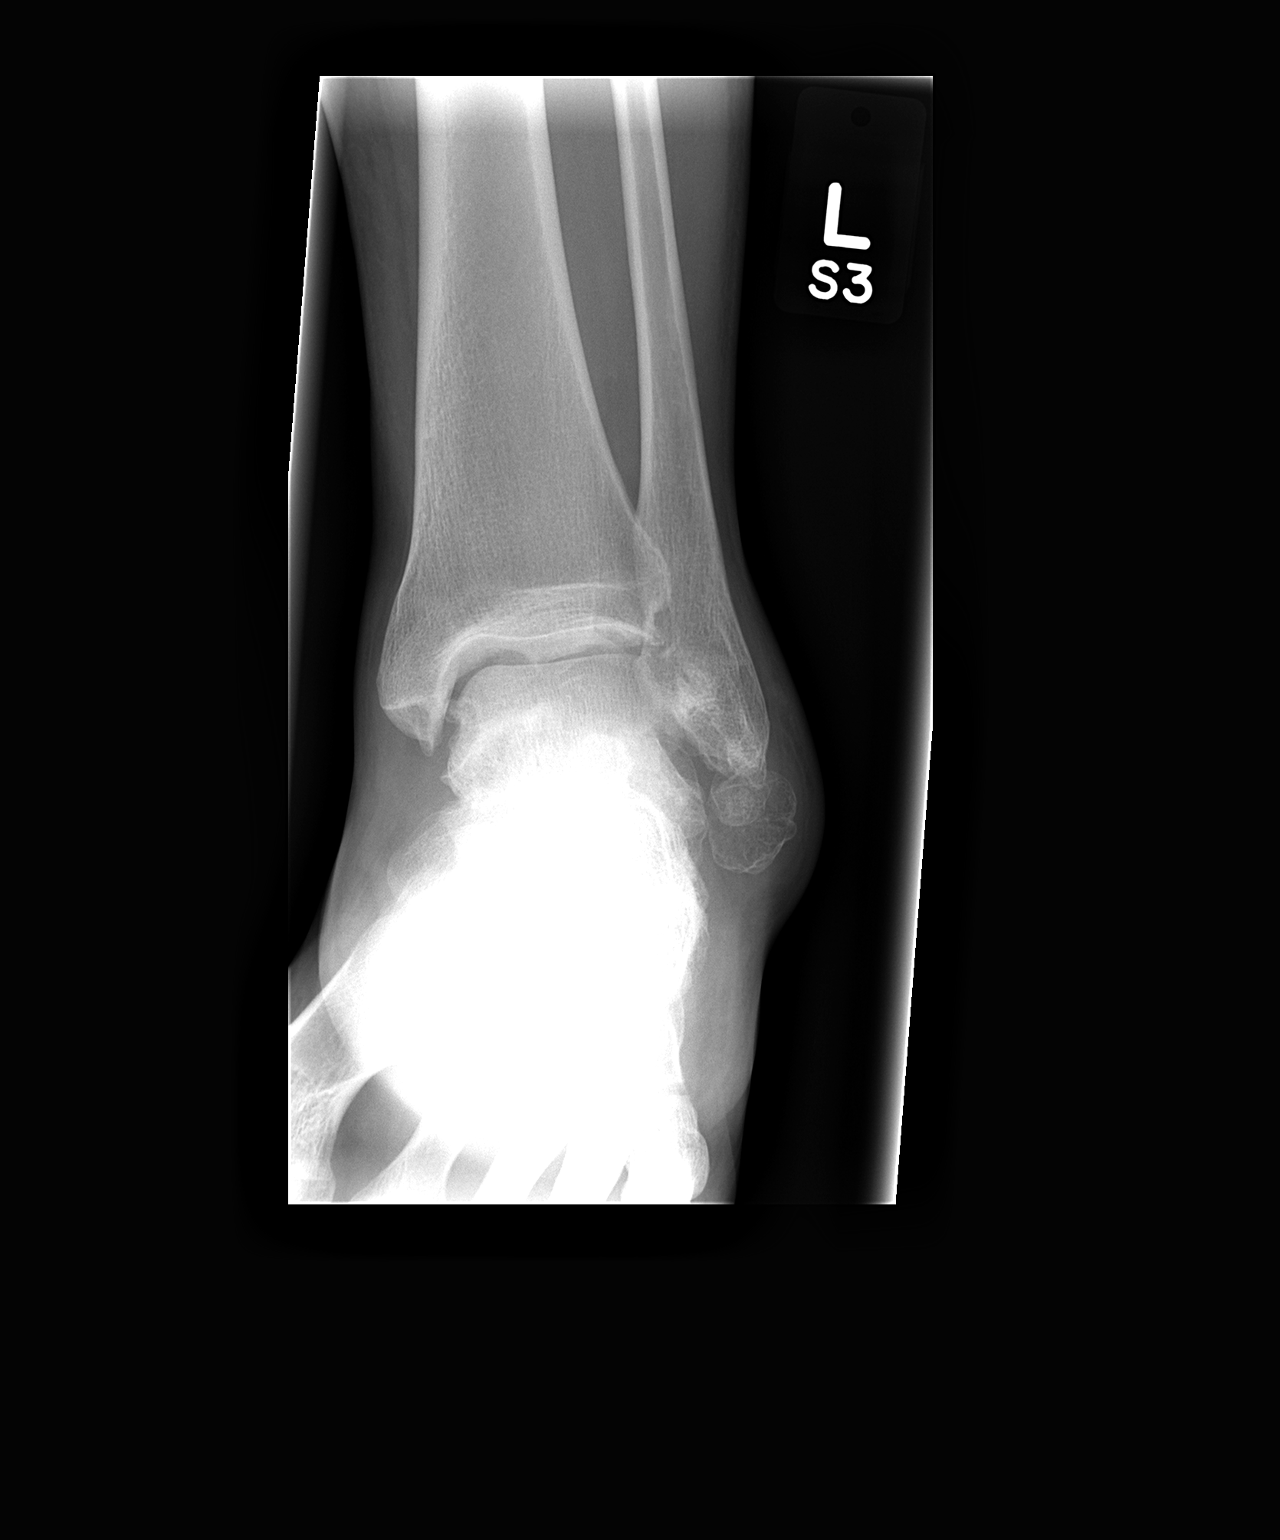

[view not recorded (2 of 3)]
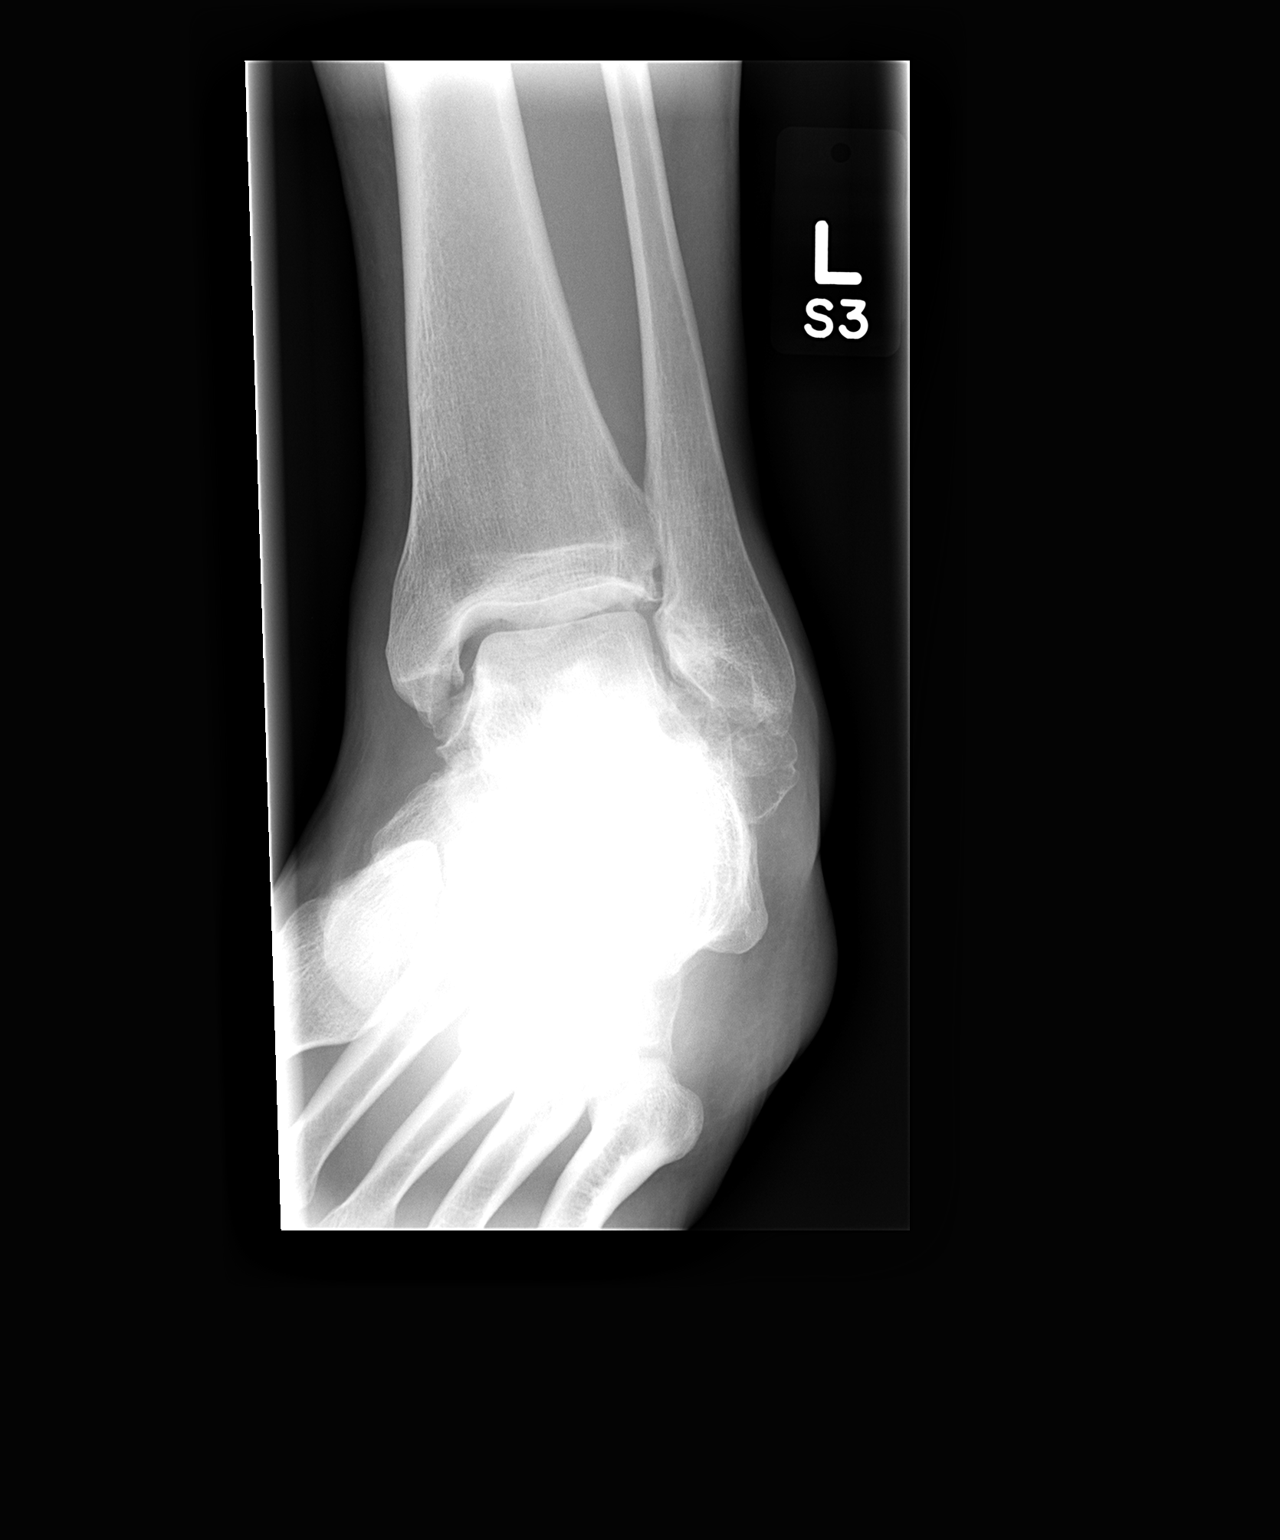

[view not recorded (3 of 3)]
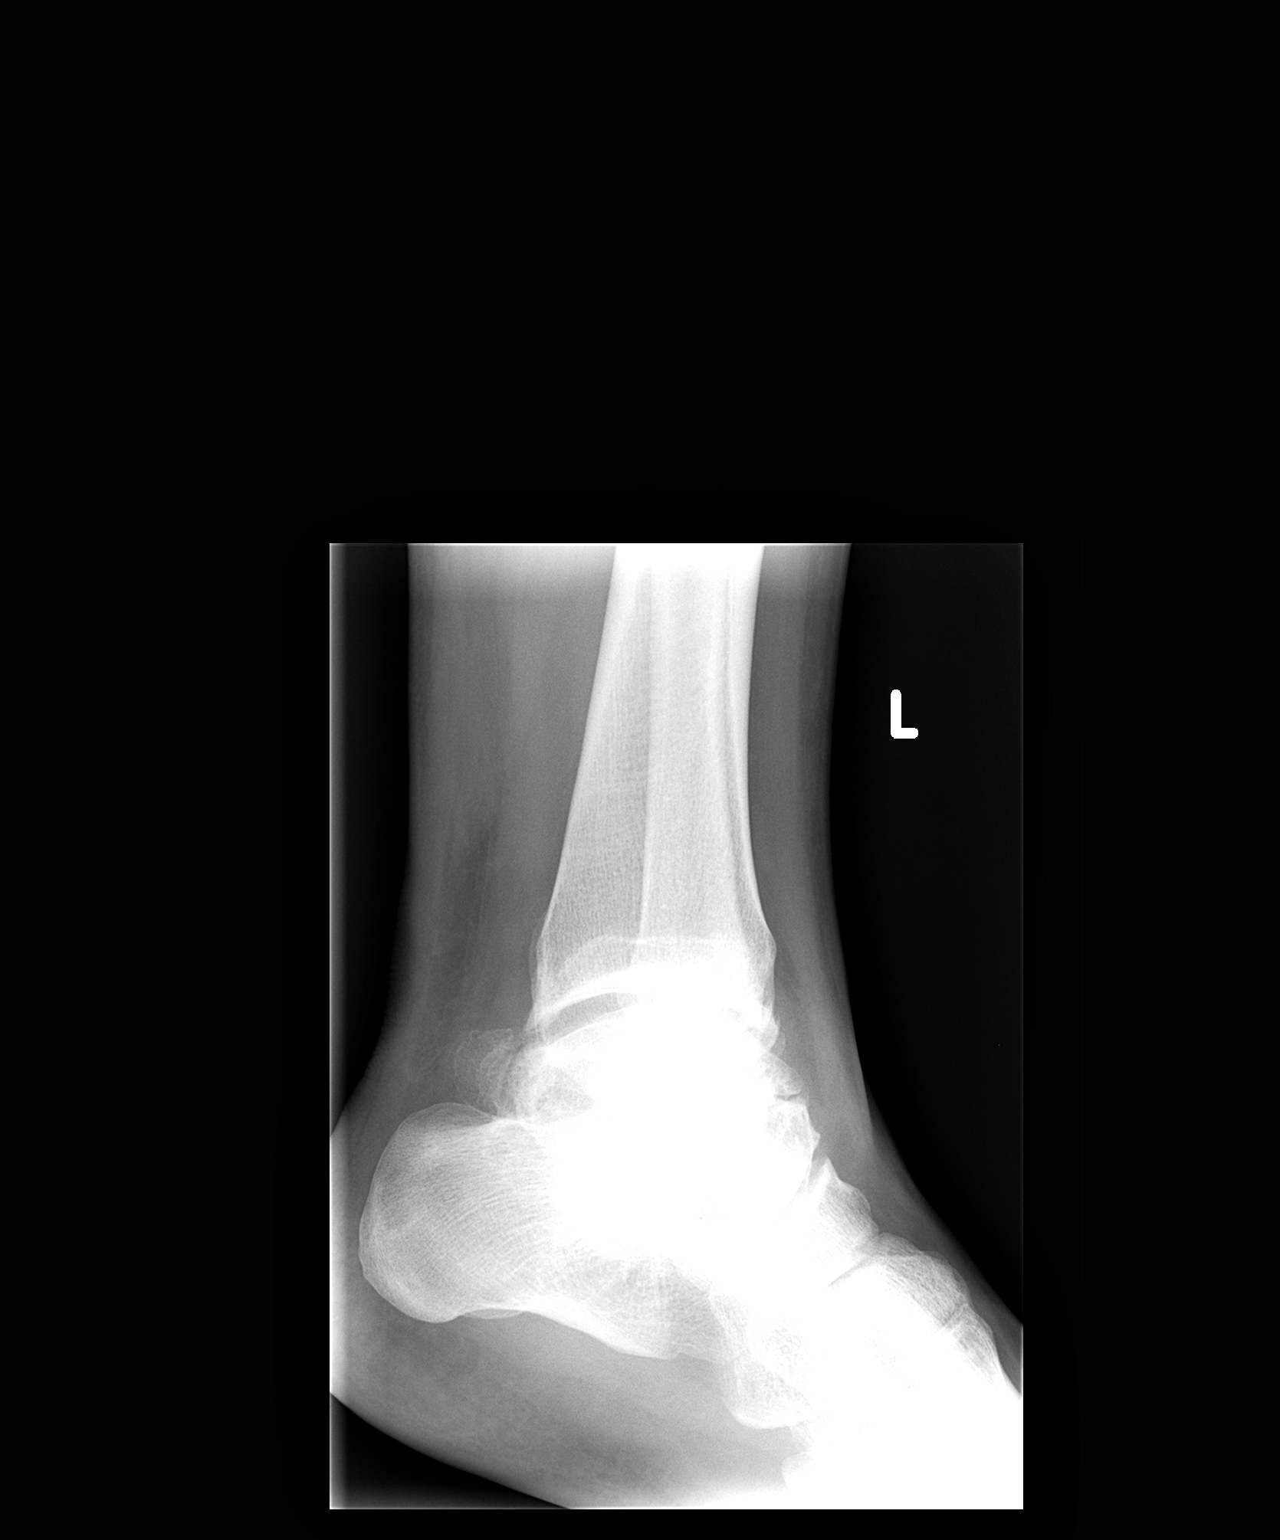

[3 of 3 positions shown; findings below may reference images not displayed]

FINDINGS: Degenerative changes with prominent well corticated bony
structure inferior to the lateral malleolus unchanged from prior
exam.  No acute fracture or dislocation.
IMPRESSION: No acute fracture  or dislocation.  Please see above.

## 2014-07-23 ENCOUNTER — Ambulatory Visit (INDEPENDENT_AMBULATORY_CARE_PROVIDER_SITE_OTHER): Payer: 59 | Admitting: *Deleted

## 2014-07-23 DIAGNOSIS — Z23 Encounter for immunization: Secondary | ICD-10-CM

## 2014-09-25 ENCOUNTER — Telehealth: Payer: Self-pay | Admitting: Internal Medicine

## 2014-09-25 NOTE — Telephone Encounter (Signed)
OK as long as no active rash present

## 2014-09-25 NOTE — Telephone Encounter (Signed)
Patient is requesting a pneumonia vac.  Please advise.  °

## 2014-09-26 NOTE — Telephone Encounter (Signed)
Patient can be scheduled for a nurse visit  

## 2014-10-01 ENCOUNTER — Ambulatory Visit (INDEPENDENT_AMBULATORY_CARE_PROVIDER_SITE_OTHER): Payer: 59 | Admitting: *Deleted

## 2014-10-01 DIAGNOSIS — Z23 Encounter for immunization: Secondary | ICD-10-CM

## 2015-01-23 ENCOUNTER — Other Ambulatory Visit (INDEPENDENT_AMBULATORY_CARE_PROVIDER_SITE_OTHER): Payer: 59

## 2015-01-23 ENCOUNTER — Encounter: Payer: Self-pay | Admitting: Internal Medicine

## 2015-01-23 ENCOUNTER — Ambulatory Visit (INDEPENDENT_AMBULATORY_CARE_PROVIDER_SITE_OTHER): Payer: 59 | Admitting: Internal Medicine

## 2015-01-23 VITALS — BP 118/84 | HR 68 | Temp 98.3°F | Resp 15 | Ht 67.0 in | Wt 226.0 lb

## 2015-01-23 DIAGNOSIS — I10 Essential (primary) hypertension: Secondary | ICD-10-CM

## 2015-01-23 DIAGNOSIS — K219 Gastro-esophageal reflux disease without esophagitis: Secondary | ICD-10-CM | POA: Diagnosis not present

## 2015-01-23 DIAGNOSIS — M25551 Pain in right hip: Secondary | ICD-10-CM

## 2015-01-23 DIAGNOSIS — J4520 Mild intermittent asthma, uncomplicated: Secondary | ICD-10-CM

## 2015-01-23 LAB — BASIC METABOLIC PANEL
BUN: 25 mg/dL — ABNORMAL HIGH (ref 6–23)
CHLORIDE: 104 meq/L (ref 96–112)
CO2: 27 meq/L (ref 19–32)
Calcium: 9.4 mg/dL (ref 8.4–10.5)
Creatinine, Ser: 0.99 mg/dL (ref 0.40–1.50)
GFR: 111.97 mL/min (ref >60.00)
Glucose, Bld: 91 mg/dL (ref 70–99)
Potassium: 4.3 mEq/L (ref 3.5–5.1)
SODIUM: 136 meq/L (ref 135–145)

## 2015-01-23 LAB — LIPID PANEL
CHOL/HDL RATIO: 3
Cholesterol: 131 mg/dL (ref 0–200)
HDL: 50.3 mg/dL (ref >39.00)
LDL CALC: 63 mg/dL (ref 0–99)
NonHDL: 80.7
TRIGLYCERIDES: 87 mg/dL (ref 0.0–149.0)
VLDL: 17.4 mg/dL (ref 0.0–40.0)

## 2015-01-23 MED ORDER — LOSARTAN POTASSIUM 100 MG PO TABS: ORAL_TABLET | ORAL | Status: DC

## 2015-01-23 MED ORDER — ALBUTEROL SULFATE HFA 108 (90 BASE) MCG/ACT IN AERS: 2.0000 | INHALATION_SPRAY | Freq: Four times a day (QID) | RESPIRATORY_TRACT | Status: DC | PRN

## 2015-01-23 MED ORDER — AMLODIPINE BESYLATE 5 MG PO TABS: ORAL_TABLET | ORAL | Status: DC

## 2015-01-23 MED ORDER — BUDESONIDE-FORMOTEROL FUMARATE 160-4.5 MCG/ACT IN AERO: 2.0000 | INHALATION_SPRAY | Freq: Two times a day (BID) | RESPIRATORY_TRACT | Status: DC

## 2015-01-23 NOTE — Progress Notes (Signed)
Pre visit review using our clinic review tool, if applicable. No additional management support is needed unless otherwise documented below in the visit note. 

## 2015-01-23 NOTE — Progress Notes (Signed)
   Subjective:    Patient ID: Evan Burns, male    DOB: 01/04/1981, 34 y.o.   MRN: [id number]  HPI  He is here form completion as part of a wellness evaluation for his job. Labs are needed for lipids and glucose. He states that today is the deadline for this form; unfortunately he drank a "smoothie" approximately 4 hours ago.  He is not on a heart healthy diet as he ingests burgers regularly. He does restrict sodium. He has started to reduce carbs.  He does exercise 3 times a week at the gym as weights and cardio without cardio pulmonary symptoms.  Last lipids on record were 2011; lipids were excellent at that time. A1c was 5.6.  He does not monitor his blood pressure at home.  His asthma has been controlled for the most part unless he is exposed to dust at work. He'll use albuterol as needed.  He is a history of reflux; he denies any active GI symptoms.  Review of Systems Intermittently he has pain in his right hip when walking; this is not a constant phenomena. He was concerned this might represent an exacerbation of his dermatomyositis.  Chest pain, palpitations, tachycardia, exertional dyspnea, paroxysmal nocturnal dyspnea, claudication or edema are absent.  Unexplained weight loss, abdominal pain, significant dyspepsia, dysphagia, melena, rectal bleeding, or persistently small caliber stools are denied.       Objective:   Physical Exam  Pertinent positive findings include: He has a mustache and goatee. He has diffuse subcutaneous granulomatous changes over the knuckles and elbows bilaterally.  There is a flexion contraction of the DIP joint of the fourth left finger.  He has scattered irregular hyperpigmentation of the skin. He has full range of motion of extremities. There is no pain in the right hip with ROM.  Straight leg raising is negative.  Strength, tone, deep tendon reflexes are normal.  General appearance :adequately nourished; in no distress. Eyes: No  conjunctival inflammation or scleral icterus is present. Oral exam:  Lips and gums are healthy appearing.There is no oropharyngeal erythema or exudate noted. Dental hygiene is good. Heart:  Normal rate and regular rhythm. S1 and S2 normal without gallop, murmur, click, rub or other extra sounds   Lungs:Chest clear to auscultation; no wheezes, rhonchi,rales ,or rubs present.No increased work of breathing.  Abdomen: bowel sounds normal, soft and non-tender without masses, organomegaly or hernias noted.  No guarding or rebound. Vascular : all pulses equal ; no bruits present. Skin:Warm & dry.  Intact without suspicious lesions or rashes ; no tenting  Lymphatic: No lymphadenopathy is noted about the head, neck, axilla      Assessment & Plan:  #1 wellness exam for work  #2 hip pain; bursitis is suggested. If symptoms persist sports medicine consultation recommended  #3 hypertension,well controlled  #4 asthma, well controlled  #5 reflux,, well controlled  Plan: Labs will be checked even though he has only fasted for 4 hours.

## 2015-01-23 NOTE — Patient Instructions (Signed)
If your symptoms persist or progress; I would recommend referral to Dr Ayesha MohairZack Smith, Sports Medicine.   Minimal Blood Pressure Goal= AVERAGE < 140/90;  Ideal is an AVERAGE < 135/85. This AVERAGE should be calculated from @ least 5-7 BP readings taken @ different times of day on different days of week. You should not respond to isolated BP readings , but rather the AVERAGE for that week .Please bring your  blood pressure cuff to office visits to verify that it is reliable.It  can also be checked against the blood pressure device at the pharmacy. Finger or wrist cuffs are not dependable; an arm cuff is.   Your next office appointment will be determined based upon review of your pending labs 7/or xrays  Those instructions will be transmitted to you by  mail for your records.  Critical results will be called.

## 2015-02-03 ENCOUNTER — Encounter: Payer: Self-pay | Admitting: Internal Medicine

## 2015-02-03 NOTE — Progress Notes (Signed)
Patient's [employer] biometric form has been faxed to [phone]. Original copy has been sent to medical records for scan

## 2015-05-15 ENCOUNTER — Telehealth: Payer: Self-pay | Admitting: Internal Medicine

## 2015-05-15 NOTE — Telephone Encounter (Signed)
A statement is needed from the specialist who is treating this condition as to status, complications & limitations ; then he would need to be seen

## 2015-05-15 NOTE — Telephone Encounter (Signed)
Patient states he is going on FMLA for dermatomyositis and is needing forms completed.  Patient would like to know if Dr. Alwyn Ren needs him to come in for further evaluation or if he could drop the forms off?

## 2015-05-15 NOTE — Telephone Encounter (Signed)
Spoke with pt he is going to call the specialist in [place] and have the notes faxed over to our office with the condition

## 2015-05-15 NOTE — Telephone Encounter (Signed)
Please advise 

## 2015-05-26 NOTE — Telephone Encounter (Signed)
Patient states [facility] wants a medical release form to be sent to them.  Patient to come in today to complete.

## 2015-05-28 NOTE — Telephone Encounter (Signed)
Advised patient that we have not received notes/records from [facility] yet, we will call patient when we get them to schedule appt with hopper

## 2015-05-28 NOTE — Telephone Encounter (Signed)
Patient would like to know if Dr. Alwyn Ren has received these notes yet.

## 2015-06-19 ENCOUNTER — Telehealth: Payer: Self-pay | Admitting: Internal Medicine

## 2015-06-19 NOTE — Telephone Encounter (Signed)
Received records from [facility] forwarded 44 pages to Dr. Marga Melnick 06/19/15 fbg.

## 2015-06-23 ENCOUNTER — Encounter: Payer: Self-pay | Admitting: Internal Medicine

## 2015-07-01 ENCOUNTER — Ambulatory Visit (INDEPENDENT_AMBULATORY_CARE_PROVIDER_SITE_OTHER): Payer: 59

## 2015-07-01 DIAGNOSIS — Z23 Encounter for immunization: Secondary | ICD-10-CM | POA: Diagnosis not present

## 2015-07-16 ENCOUNTER — Encounter: Payer: Self-pay | Admitting: Internal Medicine

## 2015-07-16 ENCOUNTER — Ambulatory Visit (INDEPENDENT_AMBULATORY_CARE_PROVIDER_SITE_OTHER): Payer: 59 | Admitting: Internal Medicine

## 2015-07-16 VITALS — BP 132/84 | HR 74 | Temp 98.2°F | Resp 16 | Wt 222.0 lb

## 2015-07-16 DIAGNOSIS — M3390 Dermatopolymyositis, unspecified, organ involvement unspecified: Secondary | ICD-10-CM

## 2015-07-16 DIAGNOSIS — J019 Acute sinusitis, unspecified: Secondary | ICD-10-CM

## 2015-07-16 MED ORDER — AMOXICILLIN 500 MG PO CAPS: 500.0000 mg | ORAL_CAPSULE | Freq: Three times a day (TID) | ORAL | Status: DC

## 2015-07-16 NOTE — Patient Instructions (Signed)

## 2015-07-16 NOTE — Progress Notes (Signed)
   Subjective:    Patient ID: Evan Burns, male    DOB: [birth year], 34 y.o.   MRN: [id number]  HPI  His symptoms began 07/06/15 as sore throat. As of 9/14 he developed nonproductive cough which was intermittent and worse at night. He's had associated nasal congestion with yellow nasal discharge. Also as of 9/20 he's had itchy, watery eyes. He's had some self-limited temporal headache.  He's taking Claritin & DayQuil with some response . He does have asthma but has not used his rescue inhaler  Review of Systems  He has not had postnasal drainage, otic pain, otic discharge, frontal headache, facial pain, shortness of breath, or wheezing.     Objective:   Physical Exam  Extremely well muscled . [name] is present. He has marked erythema of the nasal mucosa. Breath sounds are decreased. He has large irregular tophi over the elbows and over the dorsum of the hands.  General appearance:Adequately nourished; no acute distress or increased work of breathing is present.    Lymphatic: No  lymphadenopathy about the head, neck, or axilla .  Eyes: No conjunctival inflammation or lid edema is present. There is no scleral icterus.  Ears:  External ear exam shows no significant lesions or deformities.  Otoscopic examination reveals clear canals, tympanic membranes are intact bilaterally without bulging, retraction, inflammation or discharge.  Nose:  External nasal examination shows no deformity or inflammation. Some obstruction to airflow.   Oral exam: Dental hygiene is good; lips and gums are healthy appearing.There is no oropharyngeal erythema or exudate .  Neck:  No deformities, thyromegaly, masses, or tenderness noted.   Supple with full range of motion without pain.   Heart:  Normal rate and regular rhythm. S1 and S2 normal without gallop, murmur, click, rub or other extra sounds.   Lungs:Chest clear to auscultation; no wheezes, rhonchi,rales ,or rubs present.  Extremities:  No cyanosis,  edema, or clubbing  noted    Skin: Warm & dry w/o tenting.No significant lesions or rash.     Assessment & Plan:  #1 rhinosinusitis #2 dermatomyositis; he is requesting an updated rheumatologic evaluation Plan: See orders and recommendations

## 2015-07-16 NOTE — Progress Notes (Signed)
Pre visit review using our clinic review tool, if applicable. No additional management support is needed unless otherwise documented below in the visit note. 

## 2015-08-19 ENCOUNTER — Encounter: Payer: Self-pay | Admitting: Internal Medicine

## 2015-10-13 ENCOUNTER — Encounter: Payer: Self-pay | Admitting: Internal Medicine

## 2015-10-13 ENCOUNTER — Ambulatory Visit (INDEPENDENT_AMBULATORY_CARE_PROVIDER_SITE_OTHER): Payer: 59 | Admitting: Internal Medicine

## 2015-10-13 VITALS — BP 118/72 | HR 72 | Temp 97.9°F | Resp 20 | Ht 67.0 in | Wt 225.2 lb

## 2015-10-13 DIAGNOSIS — J209 Acute bronchitis, unspecified: Secondary | ICD-10-CM | POA: Diagnosis not present

## 2015-10-13 MED ORDER — AZITHROMYCIN 250 MG PO TABS
ORAL_TABLET | ORAL | Status: DC
Start: 2015-10-13 — End: 2015-11-06

## 2015-10-13 NOTE — Progress Notes (Signed)
Pre visit review using our clinic review tool, if applicable. No additional management support is needed unless otherwise documented below in the visit note. 

## 2015-10-13 NOTE — Progress Notes (Signed)
   Subjective:    Patient ID: Evan Burns, male    DOB: 02/19/1981, 34 y.o.   MRN: [id number]  HPI He has had a cough since 10/05/15. In the last 3 days he has had a sore throat. He's had minor sneezing morning. The cough remains nonproductive. It is not disturbing sleep. He has no associated upper respiratory tract or lower respiratory tract infection symptoms.  He has treated this with increased fluids.   Review of Systems Frontal headache, facial pain , nasal purulence, dental pain, otic pain or otic discharge denied. No fever , chills or sweats. Extrinsic symptoms of itchy, watery eyes, or angioedema are denied. There is no sputum production, wheezing,or  paroxysmal nocturnal dyspnea.    Objective:   Physical Exam General appearance:Adequately nourished; no acute distress or increased work of breathing is present.    Lymphatic: No  lymphadenopathy about the head, neck, or axilla .  Eyes: No conjunctival inflammation or lid edema is present. There is no scleral icterus.  Ears:  External ear exam shows no significant lesions or deformities.  Otoscopic examination reveals clear canals, tympanic membranes are intact bilaterally without bulging, retraction, inflammation or discharge.  Nose:  External nasal examination shows no deformity or inflammation. Nasal mucosa are pink and moist without lesions or exudates No septal dislocation or deviation.No obstruction to airflow.   Oral exam: Dental hygiene is good; lips and gums are healthy appearing.There is no oropharyngeal erythema or exudate .  Neck:  No deformities, thyromegaly, masses, or tenderness noted.   Supple with full range of motion without pain.   Heart:  Normal rate and regular rhythm. S1 and S2 normal without gallop, murmur, click, rub or other extra sounds.   Lungs:Chest clear to auscultation; no wheezes, rhonchi,rales ,or rubs present.  Extremities:  No cyanosis, edema, or clubbing  noted    Skin: Warm & dry w/o  tenting or jaundice. No significant lesions or rash.        Assessment & Plan:  #1 acute bronchitis w/o bronchospasm #2 sore throat Plan: See orders and recommendations

## 2015-10-13 NOTE — Patient Instructions (Signed)
Carry room temperature water and sip liberally after coughing. Plain Mucinex (NOT Mucinex D) for thick secretions.Report persistent wheezing or shortness of breath. Zicam Melts or Zinc lozenges as per package label for sore throat .  Complementary options to boost immunity include  vitamin C 2000 mg daily; & Echinacea for 4-7 days.

## 2015-11-04 ENCOUNTER — Ambulatory Visit (INDEPENDENT_AMBULATORY_CARE_PROVIDER_SITE_OTHER): Payer: 59 | Admitting: Internal Medicine

## 2015-11-04 ENCOUNTER — Encounter: Payer: Self-pay | Admitting: Internal Medicine

## 2015-11-04 VITALS — BP 130/88 | HR 91 | Temp 98.1°F | Ht 67.0 in | Wt 225.0 lb

## 2015-11-04 DIAGNOSIS — J019 Acute sinusitis, unspecified: Secondary | ICD-10-CM

## 2015-11-04 DIAGNOSIS — I10 Essential (primary) hypertension: Secondary | ICD-10-CM

## 2015-11-04 DIAGNOSIS — J4520 Mild intermittent asthma, uncomplicated: Secondary | ICD-10-CM

## 2015-11-04 MED ORDER — LEVOFLOXACIN 500 MG PO TABS: 500.0000 mg | ORAL_TABLET | Freq: Every day | ORAL | Status: DC

## 2015-11-04 MED ORDER — HYDROCODONE-HOMATROPINE 5-1.5 MG/5ML PO SYRP: 5.0000 mL | ORAL_SOLUTION | Freq: Four times a day (QID) | ORAL | Status: DC | PRN

## 2015-11-04 NOTE — Progress Notes (Signed)
Subjective:    Patient ID: Evan Burns, male    DOB: 05/03/1981, 35 y.o.   MRN: [id number]  HPI   Here with 2-3 days acute onset fever, facial pain, pressure, headache, general weakness and malaise, and greenish d/c, with mild ST and cough, but pt denies chest pain, wheezing, increased sob or doe, orthopnea, PND, increased LE swelling, palpitations, dizziness or syncope.   Pt denies polydipsia, polyuria Past Medical History  Diagnosis Date  . Asthma   . Dermatomyositis (HCC)   . Hypertension    Past Surgical History  Procedure Laterality Date  . Hand surgery      reports that he has never smoked. He does not have any smokeless tobacco history on file. He reports that he does not drink alcohol or use illicit drugs. family history is not on file. Allergies  Allergen Reactions  . Sulfonamide Derivatives     REACTION: SWELLING (in childhood)  . Cephalexin     REACTION: vomiting  No rash or fever  . Lisinopril     REACTION: cough   Current Outpatient Prescriptions on File Prior to Visit  Medication Sig Dispense Refill  . albuterol (PROVENTIL HFA;VENTOLIN HFA) 108 (90 BASE) MCG/ACT inhaler Inhale 2 puffs into the lungs every 6 (six) hours as needed for wheezing or shortness of breath. 18 g 11  . albuterol (PROVENTIL) (2.5 MG/3ML) 0.083% nebulizer solution Take 3 mLs (2.5 mg total) by nebulization every 6 (six) hours as needed for wheezing. 150 mL 11  . amLODipine (NORVASC) 5 MG tablet take 1 tablet by mouth once daily 90 tablet 1  . budesonide-formoterol (SYMBICORT) 160-4.5 MCG/ACT inhaler Inhale 2 puffs into the lungs 2 (two) times daily. 1 Inhaler 5  . COD LIVER OIL PO Take 1 capsule by mouth daily.    Marland Kitchen. losartan (COZAAR) 100 MG tablet take 1 tablet by mouth once daily 90 tablet 1  . Omega-3 Fatty Acids (FISH OIL) 1000 MG CAPS Take 1,000 mg by mouth daily.     Marland Kitchen. azithromycin (ZITHROMAX Z-PAK) 250 MG tablet 2 day 1, then 1 qd (Patient not taking: Reported on 11/04/2015) 6 tablet  0   No current facility-administered medications on file prior to visit.   Review of Systems  Constitutional: Negative for unusual diaphoresis or night sweats HENT: Negative for ringing in ear or discharge Eyes: Negative for double vision or worsening visual disturbance.  Respiratory: Negative for choking and stridor.   Gastrointestinal: Negative for vomiting or other signifcant bowel change Genitourinary: Negative for hematuria or change in urine volume.  Musculoskeletal: Negative for other MSK pain or swelling Skin: Negative for color change and worsening wound.  Neurological: Negative for tremors and numbness other than noted  Psychiatric/Behavioral: Negative for decreased concentration or agitation other than above       Objective:   Physical Exam BP 130/88 mmHg  Pulse 91  Temp(Src) 98.1 F (36.7 C) (Oral)  Ht 5\' 7"  (1.702 m)  Wt 225 lb (102.059 kg)  BMI 35.23 kg/m2  SpO2 97% VS noted, mild ill Constitutional: Pt appears in no significant distress HENT: Head: NCAT.  Right Ear: External ear normal.  Left Ear: External ear normal.  Bilat tm's with mild erythema.  Max sinus areas mild tender.  Pharynx with mild erythema, no exudate Eyes: . Pupils are equal, round, and reactive to light. Conjunctivae and EOM are normal Neck: Normal range of motion. Neck supple.  Cardiovascular: Normal rate and regular rhythm.   Pulmonary/Chest: Effort normal and  breath sounds without rales or wheezing.  Neurological: Pt is alert. Not confused , motor grossly intact Skin: Skin is warm. No rash, no LE edema Psychiatric: Pt behavior is normal. No agitation.         Assessment & Plan:

## 2015-11-04 NOTE — Patient Instructions (Signed)
Please take all new medication as prescribed - the antibiotic, and cough med if needed ° °Please continue all other medications as before, and refills have been done if requested. ° °Please have the pharmacy call with any other refills you may need. ° °Please keep your appointments with your specialists as you may have planned ° ° ° ° °

## 2015-11-04 NOTE — Assessment & Plan Note (Signed)
Mild to mod, for antibx course,  to f/u any worsening symptoms or concerns, also cough med prn 

## 2015-11-04 NOTE — Assessment & Plan Note (Signed)
stable overall by history and exam, recent data reviewed with pt, and pt to continue medical treatment as before,  to f/u any worsening symptoms or concerns SpO2 Readings from Last 3 Encounters:  11/04/15 97%  10/13/15 97%  07/16/15 98%

## 2015-11-04 NOTE — Progress Notes (Signed)
Pre visit review using our clinic review tool, if applicable. No additional management support is needed unless otherwise documented below in the visit note. 

## 2015-11-04 NOTE — Assessment & Plan Note (Signed)
stable overall by history and exam, recent data reviewed with pt, and pt to continue medical treatment as before,  to f/u any worsening symptoms or concerns BP Readings from Last 3 Encounters:  11/04/15 130/88  10/13/15 118/72  07/16/15 132/84

## 2015-11-06 ENCOUNTER — Ambulatory Visit (INDEPENDENT_AMBULATORY_CARE_PROVIDER_SITE_OTHER): Payer: 59 | Admitting: Internal Medicine

## 2015-11-06 ENCOUNTER — Encounter: Payer: Self-pay | Admitting: Internal Medicine

## 2015-11-06 ENCOUNTER — Ambulatory Visit (INDEPENDENT_AMBULATORY_CARE_PROVIDER_SITE_OTHER)
Admission: RE | Admit: 2015-11-06 | Discharge: 2015-11-06 | Disposition: A | Discharge status: Home / Self Care | Payer: 59 | Source: Ambulatory Visit | Attending: Internal Medicine | Admitting: Internal Medicine

## 2015-11-06 VITALS — BP 144/90 | HR 102 | Temp 98.4°F | Ht 67.0 in | Wt 225.0 lb

## 2015-11-06 DIAGNOSIS — J4520 Mild intermittent asthma, uncomplicated: Secondary | ICD-10-CM | POA: Diagnosis not present

## 2015-11-06 DIAGNOSIS — I10 Essential (primary) hypertension: Secondary | ICD-10-CM | POA: Diagnosis not present

## 2015-11-06 DIAGNOSIS — R059 Cough, unspecified: Secondary | ICD-10-CM

## 2015-11-06 DIAGNOSIS — R05 Cough: Secondary | ICD-10-CM

## 2015-11-06 MED ORDER — AMOXICILLIN-POT CLAVULANATE 875-125 MG PO TABS: 1.0000 | ORAL_TABLET | Freq: Two times a day (BID) | ORAL | Status: DC

## 2015-11-06 NOTE — Patient Instructions (Signed)
OK to stop the levaquin  Please take all new medication as prescribed - the generic for augmentin   Please continue all other medications as before, and refills have been done if requested.  Please have the pharmacy call with any other refills you may need.  Please keep your appointments with your specialists as you may have planned  Please go to the XRAY Department in the Basement (go straight as you get off the elevator) for the x-ray testing  You will be contacted by phone if any changes need to be made immediately.  Otherwise, you will receive a letter about your results with an explanation, but please check with MyChart first.  Please remember to sign up for MyChart if you have not done so, as this will be important to you in the future with finding out test results, communicating by private email, and scheduling acute appointments online when needed.

## 2015-11-07 ENCOUNTER — Encounter: Payer: Self-pay | Admitting: Internal Medicine

## 2015-11-07 ENCOUNTER — Telehealth: Payer: Self-pay | Admitting: Internal Medicine

## 2015-11-07 NOTE — Telephone Encounter (Signed)
Mother advised via personal VM

## 2015-11-07 NOTE — Telephone Encounter (Signed)
Would like to know what her son was diagnosised with this week when he came in.  Mother is on [employer] release.

## 2015-11-08 DIAGNOSIS — R05 Cough: Secondary | ICD-10-CM | POA: Insufficient documentation

## 2015-11-08 DIAGNOSIS — R059 Cough, unspecified: Secondary | ICD-10-CM | POA: Insufficient documentation

## 2015-11-08 NOTE — Progress Notes (Signed)
Subjective:    Patient ID: Evan Burns, male    DOB: 04/20/1981, 35 y.o.   MRN: [id number]  HPI   Here with acute onset mild to mod 2-3 days ST, HA, general weakness and malaise, with prod cough greenish sputum, but Pt denies chest pain, increased sob or doe, wheezing, orthopnea, PND, increased LE swelling, palpitations, dizziness or syncope.  Pt denies new neurological symptoms such as new headache, or facial or extremity weakness or numbness except for the above.   Pt denies polydipsia, polyuria, Past Medical History  Diagnosis Date  . Asthma   . Dermatomyositis (HCC)   . Hypertension    Past Surgical History  Procedure Laterality Date  . Hand surgery      reports that he has never smoked. He does not have any smokeless tobacco history on file. He reports that he does not drink alcohol or use illicit drugs. family history is not on file. Allergies  Allergen Reactions  . Sulfonamide Derivatives     REACTION: SWELLING (in childhood)  . Cephalexin     REACTION: vomiting  No rash or fever  . Lisinopril     REACTION: cough   Current Outpatient Prescriptions on File Prior to Visit  Medication Sig Dispense Refill  . albuterol (PROVENTIL HFA;VENTOLIN HFA) 108 (90 BASE) MCG/ACT inhaler Inhale 2 puffs into the lungs every 6 (six) hours as needed for wheezing or shortness of breath. 18 g 11  . albuterol (PROVENTIL) (2.5 MG/3ML) 0.083% nebulizer solution Take 3 mLs (2.5 mg total) by nebulization every 6 (six) hours as needed for wheezing. 150 mL 11  . amLODipine (NORVASC) 5 MG tablet take 1 tablet by mouth once daily 90 tablet 1  . budesonide-formoterol (SYMBICORT) 160-4.5 MCG/ACT inhaler Inhale 2 puffs into the lungs 2 (two) times daily. 1 Inhaler 5  . COD LIVER OIL PO Take 1 capsule by mouth daily.    Marland Kitchen. HYDROcodone-homatropine (HYCODAN) 5-1.5 MG/5ML syrup Take 5 mLs by mouth every 6 (six) hours as needed for cough. 180 mL 0  . losartan (COZAAR) 100 MG tablet take 1 tablet by mouth  once daily 90 tablet 1  . Omega-3 Fatty Acids (FISH OIL) 1000 MG CAPS Take 1,000 mg by mouth daily.      No current facility-administered medications on file prior to visit.   Review of Systems  Constitutional: Negative for unusual diaphoresis or night sweats HENT: Negative for ringing in ear or discharge Eyes: Negative for double vision or worsening visual disturbance.  Respiratory: Negative for choking and stridor.   Gastrointestinal: Negative for vomiting or other signifcant bowel change Genitourinary: Negative for hematuria or change in urine volume.  Musculoskeletal: Negative for other MSK pain or swelling Skin: Negative for color change and worsening wound.  Neurological: Negative for tremors and numbness other than noted  Psychiatric/Behavioral: Negative for decreased concentration or agitation other than above       Objective:   Physical Exam BP 144/90 mmHg  Pulse 102  Temp(Src) 98.4 F (36.9 C) (Oral)  Ht 5\' 7"  (1.702 m)  Wt 225 lb (102.059 kg)  BMI 35.23 kg/m2  SpO2 96% VS noted, mild ill Constitutional: Pt appears in no significant distress HENT: Head: NCAT.  Right Ear: External ear normal.  Left Ear: External ear normal.  Bilat tm's with mild erythema.  Max sinus areas mild tender.  Pharynx with mild erythema, no exudate Eyes: . Pupils are equal, round, and reactive to light. Conjunctivae and EOM are normal Neck: Normal  range of motion. Neck supple.  Cardiovascular: Normal rate and regular rhythm.   Pulmonary/Chest: Effort normal and breath sounds without rales or wheezing.  Neurological: Pt is alert. Not confused , motor grossly intact Skin: Skin is warm. No rash, no LE edema Psychiatric: Pt behavior is normal. No agitation.     Assessment & Plan:

## 2015-11-08 NOTE — Assessment & Plan Note (Signed)
Mild elev today , likely situational, o/w stable overall by history and exam, recent data reviewed with pt, and pt to continue medical treatment as before,  to f/u any worsening symptoms or concerns BP Readings from Last 3 Encounters:  11/06/15 144/90  11/04/15 130/88  10/13/15 118/72

## 2015-11-08 NOTE — Assessment & Plan Note (Signed)
stable overall by history and exam, recent data reviewed with pt, and pt to continue medical treatment as before,  to f/u any worsening symptoms or concerns SpO2 Readings from Last 3 Encounters:  11/06/15 96%  11/04/15 97%  10/13/15 97%

## 2015-11-08 NOTE — Assessment & Plan Note (Signed)
Mild to mod, c/w bronchitis vs pna, for cxr, also for antibx course,  Cough med prn, to f/u any worsening symptoms or concerns 

## 2015-12-30 ENCOUNTER — Ambulatory Visit (INDEPENDENT_AMBULATORY_CARE_PROVIDER_SITE_OTHER): Payer: Managed Care, Other (non HMO) | Admitting: Internal Medicine

## 2015-12-30 ENCOUNTER — Encounter: Payer: Self-pay | Admitting: Internal Medicine

## 2015-12-30 VITALS — BP 118/80 | HR 75 | Temp 98.5°F | Resp 16 | Wt 222.0 lb

## 2015-12-30 DIAGNOSIS — Z0000 Encounter for general adult medical examination without abnormal findings: Secondary | ICD-10-CM

## 2015-12-30 DIAGNOSIS — J4530 Mild persistent asthma, uncomplicated: Secondary | ICD-10-CM | POA: Diagnosis not present

## 2015-12-30 DIAGNOSIS — I10 Essential (primary) hypertension: Secondary | ICD-10-CM

## 2015-12-30 DIAGNOSIS — K219 Gastro-esophageal reflux disease without esophagitis: Secondary | ICD-10-CM

## 2015-12-30 DIAGNOSIS — M3390 Dermatopolymyositis, unspecified, organ involvement unspecified: Secondary | ICD-10-CM

## 2015-12-30 MED ORDER — ALBUTEROL SULFATE (2.5 MG/3ML) 0.083% IN NEBU: 2.5000 mg | INHALATION_SOLUTION | Freq: Four times a day (QID) | RESPIRATORY_TRACT | Status: DC | PRN

## 2015-12-30 MED ORDER — ALBUTEROL SULFATE HFA 108 (90 BASE) MCG/ACT IN AERS: 2.0000 | INHALATION_SPRAY | Freq: Four times a day (QID) | RESPIRATORY_TRACT | Status: DC | PRN

## 2015-12-30 NOTE — Assessment & Plan Note (Signed)
Only occasional symptoms Daily medication not needed Use otc med as needed

## 2015-12-30 NOTE — Assessment & Plan Note (Signed)
Mild persistent Uses symbicort daily and rarely uses albuterol No current symptoms Continue current inhalers

## 2015-12-30 NOTE — Assessment & Plan Note (Signed)
Not seeing anyone at this time Symptoms stable

## 2015-12-30 NOTE — Progress Notes (Signed)
Subjective:    Patient ID: Evan Burns, male    DOB: [birth year], 35 y.o.   MRN: [id number]  HPI He is here to establish with a new pcp.   He is here for a phsyical.   He denies any changes in his health or family health.  He has no concerns.     Medications and allergies reviewed with patient and updated if appropriate.  Patient Active Problem List   Diagnosis Date Noted  . Cough 11/08/2015  . Acute sinus infection 11/04/2015  . GERD 10/29/2010  . Essential hypertension 10/12/2010  . Dermatomyositis (HCC) 06/17/2008  . Asthma 02/22/2007    Current Outpatient Prescriptions on File Prior to Visit  Medication Sig Dispense Refill  . albuterol (PROVENTIL HFA;VENTOLIN HFA) 108 (90 BASE) MCG/ACT inhaler Inhale 2 puffs into the lungs every 6 (six) hours as needed for wheezing or shortness of breath. 18 g 11  . albuterol (PROVENTIL) (2.5 MG/3ML) 0.083% nebulizer solution Take 3 mLs (2.5 mg total) by nebulization every 6 (six) hours as needed for wheezing. 150 mL 11  . amLODipine (NORVASC) 5 MG tablet take 1 tablet by mouth once daily 90 tablet 1  . budesonide-formoterol (SYMBICORT) 160-4.5 MCG/ACT inhaler Inhale 2 puffs into the lungs 2 (two) times daily. 1 Inhaler 5  . COD LIVER OIL PO Take 1 capsule by mouth daily.    Marland Kitchen losartan (COZAAR) 100 MG tablet take 1 tablet by mouth once daily 90 tablet 1  . Omega-3 Fatty Acids (FISH OIL) 1000 MG CAPS Take 1,000 mg by mouth daily.      No current facility-administered medications on file prior to visit.    Past Medical History  Diagnosis Date  . Asthma   . Dermatomyositis (HCC)   . Hypertension     Past Surgical History  Procedure Laterality Date  . Hand surgery      Social History   Social History  . Marital Status: Married    Spouse Name: N/A  . Number of Children: N/A  . Years of Education: N/A   Social History Main Topics  . Smoking status: Never Smoker   . Smokeless tobacco: Not on file  . Alcohol Use: No  .  Drug Use: No  . Sexual Activity: Not on file   Other Topics Concern  . Not on file   Social History Narrative   Pt lives at home w/ parents     Family History  Problem Relation Age of Onset  . Diabetes    . Hypertension      Review of Systems  Constitutional: Negative for fever, chills, appetite change, fatigue and unexpected weight change.  HENT: Negative for hearing loss.   Eyes: Negative for visual disturbance.  Respiratory: Negative for cough, shortness of breath and wheezing.   Cardiovascular: Negative for chest pain, palpitations and leg swelling.  Gastrointestinal: Negative for nausea, abdominal pain, diarrhea, constipation and blood in stool.       GERD occasionally  Endocrine: Negative for polydipsia and polyuria.  Genitourinary: Negative for dysuria, hematuria and difficulty urinating.  Musculoskeletal: Positive for back pain and arthralgias.  Neurological: Negative for dizziness, weakness, light-headedness, numbness and headaches.  Psychiatric/Behavioral: Negative for dysphoric mood. The patient is not nervous/anxious.        Objective:   Filed Vitals:   12/30/15 1551  BP: 118/80  Pulse: 75  Temp: 98.5 F (36.9 C)  Resp: 16   Filed Weights   12/30/15 1551  Weight: 222 lb (  100.699 kg)   Body mass index is 34.76 kg/(m^2).   Physical Exam Constitutional: He appears well-developed and well-nourished. No distress.  HENT:  Head: Normocephalic and atraumatic.  Right Ear: External ear normal.  Left Ear: External ear normal.  Mouth/Throat: Oropharynx is clear and moist.  Normal ear canals and TM b/l  Eyes: Conjunctivae and EOM are normal.  Neck: Neck supple. No tracheal deviation present. No thyromegaly present.  No carotid bruit  Cardiovascular: Normal rate, regular rhythm, normal heart sounds and intact distal pulses.   No murmur heard. Pulmonary/Chest: Effort normal and breath sounds normal. No respiratory distress. He has no wheezes. He has no rales.   Abdominal: Soft. Bowel sounds are normal. He exhibits no distension. There is no tenderness.  Genitourinary: deferred  Musculoskeletal: He exhibits no edema.  Lymphadenopathy:    He has no cervical adenopathy.  Skin: chronic changes from dermatomyositis, Skin is warm and dry. He is not diaphoretic.  Psychiatric: He has a normal mood and affect. His behavior is normal.       Assessment & Plan:   Physical exam: Screening blood work ordered Immunizations up to date Very active with work - working two jobs No evidence of substance abuse Chronic medical problems reviewed  Skin concerns - has dermatomyositis and is not seeing a specialist.  He denies changes and feels he is stable  See Problem List for Assessment and Plan of chronic medical problems.  Follow up annually, sooner if needed

## 2015-12-30 NOTE — Assessment & Plan Note (Signed)
BP controlled Continue current medication cmp

## 2015-12-30 NOTE — Progress Notes (Signed)
Pre visit review using our clinic review tool, if applicable. No additional management support is needed unless otherwise documented below in the visit note. 

## 2015-12-30 NOTE — Patient Instructions (Signed)
Test(s) ordered today. Your results will be released to MyChart (or called to you) after review, usually within 72hours after test completion. If any changes need to be made, you will be notified at that same time.  All other Health Maintenance issues reviewed.   All recommended immunizations and age-appropriate screenings are up-to-date.  No immunizations administered today.   Medications reviewed and updated.  No changes recommended at this time.  Your prescription(s) have been submitted to your pharmacy. Please take as directed and contact our office if you believe you are having problem(s) with the medication(s).   Please followu p annually for a physical    Health Maintenance, Male A healthy lifestyle and preventative care can promote health and wellness.  Maintain regular health, dental, and eye exams.  Eat a healthy diet. Foods like vegetables, fruits, whole grains, low-fat dairy products, and lean protein foods contain the nutrients you need and are low in calories. Decrease your intake of foods high in solid fats, added sugars, and salt. Get information about a proper diet from your health care provider, if necessary.  Regular physical exercise is one of the most important things you can do for your health. Most adults should get at least 150 minutes of moderate-intensity exercise (any activity that increases your heart rate and causes you to sweat) each week. In addition, most adults need muscle-strengthening exercises on 2 or more days a week.   Maintain a healthy weight. The body mass index (BMI) is a screening tool to identify possible weight problems. It provides an estimate of body fat based on height and weight. Your health care provider can find your BMI and can help you achieve or maintain a healthy weight. For males 20 years and older:  A BMI below 18.5 is considered underweight.  A BMI of 18.5 to 24.9 is normal.  A BMI of 25 to 29.9 is considered overweight.  A  BMI of 30 and above is considered obese.  Maintain normal blood lipids and cholesterol by exercising and minimizing your intake of saturated fat. Eat a balanced diet with plenty of fruits and vegetables. Blood tests for lipids and cholesterol should begin at age 4 and be repeated every 5 years. If your lipid or cholesterol levels are high, you are over age 59, or you are at high risk for heart disease, you may need your cholesterol levels checked more frequently.Ongoing high lipid and cholesterol levels should be treated with medicines if diet and exercise are not working.  If you smoke, find out from your health care provider how to quit. If you do not use tobacco, do not start.  Lung cancer screening is recommended for adults aged 55-80 years who are at high risk for developing lung cancer because of a history of smoking. A yearly low-dose CT scan of the lungs is recommended for people who have at least a 30-pack-year history of smoking and are current smokers or have quit within the past 15 years. A pack year of smoking is smoking an average of 1 pack of cigarettes a day for 1 year (for example, a 30-pack-year history of smoking could mean smoking 1 pack a day for 30 years or 2 packs a day for 15 years). Yearly screening should continue until the smoker has stopped smoking for at least 15 years. Yearly screening should be stopped for people who develop a health problem that would prevent them from having lung cancer treatment.  If you choose to drink alcohol, do not  have more than 2 drinks per day. One drink is considered to be 12 oz (360 mL) of beer, 5 oz (150 mL) of wine, or 1.5 oz (45 mL) of liquor.  Avoid the use of street drugs. Do not share needles with anyone. Ask for help if you need support or instructions about stopping the use of drugs.  High blood pressure causes heart disease and increases the risk of stroke. High blood pressure is more likely to develop in:  People who have blood  pressure in the end of the normal range (100-139/85-89 mm Hg).  People who are overweight or obese.  People who are African American.  If you are 71-59 years of age, have your blood pressure checked every 3-5 years. If you are 52 years of age or older, have your blood pressure checked every year. You should have your blood pressure measured twice--once when you are at a hospital or clinic, and once when you are not at a hospital or clinic. Record the average of the two measurements. To check your blood pressure when you are not at a hospital or clinic, you can use:  An automated blood pressure machine at a pharmacy.  A home blood pressure monitor.  If you are 90-72 years old, ask your health care provider if you should take aspirin to prevent heart disease.  Diabetes screening involves taking a blood sample to check your fasting blood sugar level. This should be done once every 3 years after age 26 if you are at a normal weight and without risk factors for diabetes. Testing should be considered at a younger age or be carried out more frequently if you are overweight and have at least 1 risk factor for diabetes.  Colorectal cancer can be detected and often prevented. Most routine colorectal cancer screening begins at the age of 29 and continues through age 72. However, your health care provider may recommend screening at an earlier age if you have risk factors for colon cancer. On a yearly basis, your health care provider may provide home test kits to check for hidden blood in the stool. A small camera at the end of a tube may be used to directly examine the colon (sigmoidoscopy or colonoscopy) to detect the earliest forms of colorectal cancer. Talk to your health care provider about this at age 69 when routine screening begins. A direct exam of the colon should be repeated every 5-10 years through age 22, unless early forms of precancerous polyps or small growths are found.  People who are at an  increased risk for hepatitis B should be screened for this virus. You are considered at high risk for hepatitis B if:  You were born in a country where hepatitis B occurs often. Talk with your health care provider about which countries are considered high risk.  Your parents were born in a high-risk country and you have not received a shot to protect against hepatitis B (hepatitis B vaccine).  You have HIV or AIDS.  You use needles to inject street drugs.  You live with, or have sex with, someone who has hepatitis B.  You are a man who has sex with other men (MSM).  You get hemodialysis treatment.  You take certain medicines for conditions like cancer, organ transplantation, and autoimmune conditions.  Hepatitis C blood testing is recommended for all people born from [birth year] through 1964 and any individual with known risk factors for hepatitis C.  Healthy men should no longer receive prostate-specific  antigen (PSA) blood tests as part of routine cancer screening. Talk to your health care provider about prostate cancer screening.  Testicular cancer screening is not recommended for adolescents or adult males who have no symptoms. Screening includes self-exam, a health care provider exam, and other screening tests. Consult with your health care provider about any symptoms you have or any concerns you have about testicular cancer.  Practice safe sex. Use condoms and avoid high-risk sexual practices to reduce the spread of sexually transmitted infections (STIs).  You should be screened for STIs, including gonorrhea and chlamydia if:  You are sexually active and are younger than 24 years.  You are older than 24 years, and your health care provider tells you that you are at risk for this type of infection.  Your sexual activity has changed since you were last screened, and you are at an increased risk for chlamydia or gonorrhea. Ask your health care provider if you are at risk.  If you are at  risk of being infected with HIV, it is recommended that you take a prescription medicine daily to prevent HIV infection. This is called pre-exposure prophylaxis (PrEP). You are considered at risk if:  You are a man who has sex with other men (MSM).  You are a heterosexual man who is sexually active with multiple partners.  You take drugs by injection.  You are sexually active with a partner who has HIV.  Talk with your health care provider about whether you are at high risk of being infected with HIV. If you choose to begin PrEP, you should first be tested for HIV. You should then be tested every 3 months for as long as you are taking PrEP.  Use sunscreen. Apply sunscreen liberally and repeatedly throughout the day. You should seek shade when your shadow is shorter than you. Protect yourself by wearing long sleeves, pants, a wide-brimmed hat, and sunglasses year round whenever you are outdoors.  Tell your health care provider of new moles or changes in moles, especially if there is a change in shape or color. Also, tell your health care provider if a mole is larger than the size of a pencil eraser.  A one-time screening for abdominal aortic aneurysm (AAA) and surgical repair of large AAAs by ultrasound is recommended for men aged 65-75 years who are current or former smokers.  Stay current with your vaccines (immunizations).   This information is not intended to replace advice given to you by your health care provider. Make sure you discuss any questions you have with your health care provider.   Document Released: 04/08/2008 Document Revised: 11/01/2014 Document Reviewed: 03/08/2011 [employer] Interactive Patient Education [employer]2016 [employer]

## 2016-01-01 ENCOUNTER — Other Ambulatory Visit (INDEPENDENT_AMBULATORY_CARE_PROVIDER_SITE_OTHER): Payer: Managed Care, Other (non HMO)

## 2016-01-01 DIAGNOSIS — Z0000 Encounter for general adult medical examination without abnormal findings: Secondary | ICD-10-CM

## 2016-01-01 LAB — CBC WITH DIFFERENTIAL/PLATELET
BASOS ABS: 0.1 10*3/uL (ref 0.0–0.1)
Basophils Relative: 1.7 % (ref 0.0–3.0)
EOS PCT: 5.7 % — AB (ref 0.0–5.0)
Eosinophils Absolute: 0.3 10*3/uL (ref 0.0–0.7)
HEMATOCRIT: 42.9 % (ref 39.0–52.0)
Hemoglobin: 14.1 g/dL (ref 13.0–17.0)
LYMPHS ABS: 1.8 10*3/uL (ref 0.7–4.0)
LYMPHS PCT: 32.8 % (ref 12.0–46.0)
MCHC: 32.8 g/dL (ref 30.0–36.0)
MCV: 81.1 fl (ref 78.0–100.0)
MONOS PCT: 8 % (ref 3.0–12.0)
Monocytes Absolute: 0.5 10*3/uL (ref 0.1–1.0)
NEUTROS PCT: 51.8 % (ref 43.0–77.0)
Neutro Abs: 2.9 10*3/uL (ref 1.4–7.7)
Platelets: 248 10*3/uL (ref 150.0–400.0)
RBC: 5.29 Mil/uL (ref 4.22–5.81)
RDW: 12.9 % (ref 11.5–15.5)
WBC: 5.6 10*3/uL (ref 4.0–10.5)

## 2016-01-01 LAB — TSH: TSH: 1.11 u[IU]/mL (ref 0.35–4.50)

## 2016-01-01 LAB — COMPREHENSIVE METABOLIC PANEL
ALT: 36 U/L (ref 0–53)
AST: 26 U/L (ref 0–37)
Albumin: 4.3 g/dL (ref 3.5–5.2)
Alkaline Phosphatase: 41 U/L (ref 39–117)
BUN: 25 mg/dL — ABNORMAL HIGH (ref 6–23)
CALCIUM: 9.1 mg/dL (ref 8.4–10.5)
CHLORIDE: 104 meq/L (ref 96–112)
CO2: 28 meq/L (ref 19–32)
CREATININE: 0.99 mg/dL (ref 0.40–1.50)
GFR: 111.34 mL/min (ref >60.00)
Glucose, Bld: 93 mg/dL (ref 70–99)
Potassium: 4.3 mEq/L (ref 3.5–5.1)
Sodium: 139 mEq/L (ref 135–145)
Total Bilirubin: 0.4 mg/dL (ref 0.2–1.2)
Total Protein: 6.8 g/dL (ref 6.0–8.3)

## 2016-01-01 LAB — LIPID PANEL
CHOL/HDL RATIO: 3
Cholesterol: 128 mg/dL (ref 0–200)
HDL: 49.8 mg/dL (ref >39.00)
LDL CALC: 71 mg/dL (ref 0–99)
NonHDL: 78.39
TRIGLYCERIDES: 39 mg/dL (ref 0.0–149.0)
VLDL: 7.8 mg/dL (ref 0.0–40.0)

## 2016-01-02 LAB — HIV ANTIBODY (ROUTINE TESTING W REFLEX): HIV 1&2 Ab, 4th Generation: NONREACTIVE

## 2016-07-05 ENCOUNTER — Ambulatory Visit (INDEPENDENT_AMBULATORY_CARE_PROVIDER_SITE_OTHER)
Admission: RE | Admit: 2016-07-05 | Discharge: 2016-07-05 | Disposition: A | Discharge status: Home / Self Care | Payer: BLUE CROSS/BLUE SHIELD | Source: Ambulatory Visit | Attending: Nurse Practitioner | Admitting: Nurse Practitioner

## 2016-07-05 ENCOUNTER — Encounter: Payer: Self-pay | Admitting: Nurse Practitioner

## 2016-07-05 ENCOUNTER — Ambulatory Visit (INDEPENDENT_AMBULATORY_CARE_PROVIDER_SITE_OTHER): Payer: BLUE CROSS/BLUE SHIELD | Admitting: Nurse Practitioner

## 2016-07-05 VITALS — BP 120/78 | HR 73 | Temp 98.2°F | Ht 67.0 in | Wt 217.0 lb

## 2016-07-05 DIAGNOSIS — J4531 Mild persistent asthma with (acute) exacerbation: Secondary | ICD-10-CM

## 2016-07-05 DIAGNOSIS — Z23 Encounter for immunization: Secondary | ICD-10-CM

## 2016-07-05 DIAGNOSIS — R05 Cough: Secondary | ICD-10-CM | POA: Diagnosis not present

## 2016-07-05 DIAGNOSIS — J209 Acute bronchitis, unspecified: Secondary | ICD-10-CM

## 2016-07-05 DIAGNOSIS — I10 Essential (primary) hypertension: Secondary | ICD-10-CM

## 2016-07-05 DIAGNOSIS — R059 Cough, unspecified: Secondary | ICD-10-CM

## 2016-07-05 MED ORDER — AMLODIPINE BESYLATE 5 MG PO TABS: ORAL_TABLET | ORAL | 1 refills | Status: DC

## 2016-07-05 MED ORDER — ALBUTEROL SULFATE HFA 108 (90 BASE) MCG/ACT IN AERS: 2.0000 | INHALATION_SPRAY | Freq: Four times a day (QID) | RESPIRATORY_TRACT | 11 refills | Status: DC | PRN

## 2016-07-05 MED ORDER — PREDNISONE 10 MG (21) PO TBPK: 10.0000 mg | ORAL_TABLET | Freq: Every day | ORAL | 0 refills | Status: DC

## 2016-07-05 MED ORDER — PROMETHAZINE-DM 6.25-15 MG/5ML PO SYRP: 5.0000 mL | ORAL_SOLUTION | Freq: Three times a day (TID) | ORAL | 0 refills | Status: DC | PRN

## 2016-07-05 MED ORDER — LOSARTAN POTASSIUM 100 MG PO TABS: ORAL_TABLET | ORAL | 1 refills | Status: DC

## 2016-07-05 NOTE — Progress Notes (Signed)
Subjective:  Patient ID: Evan Burns, male    DOB: 1981-08-19  Age: 35 y.o. MRN: [id number]  CC: Cough (for three weeks now has been taking robittussin ); Headache; and Sore Throat   Evan Burns presents for cough and SOB x3weeks. Also needs BP medications refilled.  Cough  This is a new problem. The current episode started 1 to 4 weeks ago. The problem has been waxing and waning. The problem occurs constantly. The cough is productive of sputum (clear sputum). Associated symptoms include headaches, nasal congestion, postnasal drip, rhinorrhea, a sore throat, shortness of breath and wheezing. Pertinent negatives include no chest pain, chills, ear congestion, ear pain, fever, heartburn, hemoptysis, myalgias, rash, sweats or weight loss. The symptoms are aggravated by lying down and cold air. He has tried steroid inhaler and OTC cough suppressant (has been out of albuterol inhaler) for the symptoms. The treatment provided mild relief. His past medical history is significant for asthma and environmental allergies.  Headache   Associated symptoms include coughing, rhinorrhea and a sore throat. Pertinent negatives include no ear pain, fever or weight loss.  Sore Throat   Associated symptoms include coughing, headaches and shortness of breath. Pertinent negatives include no ear pain.    Outpatient Medications Prior to Visit  Medication Sig Dispense Refill  . budesonide-formoterol (SYMBICORT) 160-4.5 MCG/ACT inhaler Inhale 2 puffs into the lungs 2 (two) times daily. 1 Inhaler 5  . COD LIVER OIL PO Take 1 capsule by mouth daily.    . Omega-3 Fatty Acids (FISH OIL) 1000 MG CAPS Take 1,000 mg by mouth daily.     Marland Kitchen albuterol (PROVENTIL HFA;VENTOLIN HFA) 108 (90 Base) MCG/ACT inhaler Inhale 2 puffs into the lungs every 6 (six) hours as needed for wheezing or shortness of breath. 18 g 11  . albuterol (PROVENTIL) (2.5 MG/3ML) 0.083% nebulizer solution Take 3 mLs (2.5 mg total) by nebulization every  6 (six) hours as needed for wheezing. 150 mL 11  . amLODipine (NORVASC) 5 MG tablet take 1 tablet by mouth once daily 90 tablet 1  . losartan (COZAAR) 100 MG tablet take 1 tablet by mouth once daily 90 tablet 1   No facility-administered medications prior to visit.     ROS See HPI  Objective:  BP 120/78   Pulse 73   Temp 98.2 F (36.8 C)   Ht 5\' 7"  (1.702 m)   Wt 217 lb (98.4 kg)   SpO2 98%   BMI 33.99 kg/m   BP Readings from Last 3 Encounters:  07/05/16 120/78  12/30/15 118/80  11/06/15 (!) 144/90    Wt Readings from Last 3 Encounters:  07/05/16 217 lb (98.4 kg)  12/30/15 222 lb (100.7 kg)  11/06/15 225 lb (102.1 kg)    Physical Exam  Constitutional: He is oriented to person, place, and time. He appears well-developed.  HENT:  Right Ear: Tympanic membrane, external ear and ear canal normal.  Left Ear: Tympanic membrane, external ear and ear canal normal.  Nose: Rhinorrhea present. No mucosal edema. Right sinus exhibits frontal sinus tenderness. Right sinus exhibits no maxillary sinus tenderness. Left sinus exhibits frontal sinus tenderness. Left sinus exhibits no maxillary sinus tenderness.  Mouth/Throat: Uvula is midline. Posterior oropharyngeal erythema present. No oropharyngeal exudate.  Eyes: Conjunctivae and EOM are normal. Pupils are equal, round, and reactive to light. No scleral icterus.  Neck: Normal range of motion. Neck supple.  Cardiovascular: Normal rate and normal heart sounds.   Pulmonary/Chest: Effort normal and breath  sounds normal.  Musculoskeletal: Normal range of motion. He exhibits no edema.  Lymphadenopathy:    He has no cervical adenopathy.  Neurological: He is alert and oriented to person, place, and time. No cranial nerve deficit.  Skin: Skin is warm and dry.  Psychiatric: He has a normal mood and affect. His behavior is normal.  Vitals reviewed.   Lab Results  Component Value Date   WBC 5.6 01/01/2016   HGB 14.1 01/01/2016   HCT 42.9  01/01/2016   PLT 248.0 01/01/2016   GLUCOSE 93 01/01/2016   CHOL 128 01/01/2016   TRIG 39.0 01/01/2016   HDL 49.80 01/01/2016   LDLCALC 71 01/01/2016   ALT 36 01/01/2016   AST 26 01/01/2016   NA 139 01/01/2016   K 4.3 01/01/2016   CL 104 01/01/2016   CREATININE 0.99 01/01/2016   BUN 25 (H) 01/01/2016   CO2 28 01/01/2016   TSH 1.11 01/01/2016   HGBA1C 5.6 10/12/2010   Normal CXR today.  Assessment & Plan:   Evan Burns was seen today for cough, headache and sore throat.  Diagnoses and all orders for this visit:  Acute bronchitis, unspecified organism -     DG Chest 2 View; Future -     predniSONE (STERAPRED UNI-PAK 21 TAB) 10 MG (21) TBPK tablet; Take 1 tablet (10 mg total) by mouth daily. -     promethazine-dextromethorphan (PROMETHAZINE-DM) 6.25-15 MG/5ML syrup; Take 5 mLs by mouth 3 (three) times daily as needed for cough.  Asthma, mild persistent, with acute exacerbation -     albuterol (PROVENTIL HFA;VENTOLIN HFA) 108 (90 Base) MCG/ACT inhaler; Inhale 2 puffs into the lungs every 6 (six) hours as needed for wheezing or shortness of breath. -     promethazine-dextromethorphan (PROMETHAZINE-DM) 6.25-15 MG/5ML syrup; Take 5 mLs by mouth 3 (three) times daily as needed for cough.  Essential hypertension -     amLODipine (NORVASC) 5 MG tablet; take 1 tablet by mouth once daily -     losartan (COZAAR) 100 MG tablet; take 1 tablet by mouth once daily  Need for influenza vaccination -     Flu Vaccine QUAD 36+ mos PF IM (Fluarix & Fluzone Quad PF)   I am having Evan Burns start on predniSONE and promethazine-dextromethorphan. I am also having him maintain his Fish Oil, COD LIVER OIL PO, budesonide-formoterol, amLODipine, albuterol, and losartan.  Meds ordered this encounter  Medications  . amLODipine (NORVASC) 5 MG tablet    Sig: take 1 tablet by mouth once daily    Dispense:  90 tablet    Refill:  1    Order Specific Question:   Supervising Provider    Answer:   Tresa GarterPLOTNIKOV,  ALEKSEI V [[id number]]  . albuterol (PROVENTIL HFA;VENTOLIN HFA) 108 (90 Base) MCG/ACT inhaler    Sig: Inhale 2 puffs into the lungs every 6 (six) hours as needed for wheezing or shortness of breath.    Dispense:  18 g    Refill:  11    Order Specific Question:   Supervising Provider    Answer:   Tresa GarterPLOTNIKOV, ALEKSEI V [[id number]]  . losartan (COZAAR) 100 MG tablet    Sig: take 1 tablet by mouth once daily    Dispense:  90 tablet    Refill:  1    Order Specific Question:   Supervising Provider    Answer:   Tresa GarterPLOTNIKOV, ALEKSEI V [[id number]]  . predniSONE (STERAPRED UNI-PAK 21 TAB) 10 MG (21) TBPK tablet    Sig:  Take 1 tablet (10 mg total) by mouth daily.    Dispense:  21 tablet    Refill:  0    Order Specific Question:   Supervising Provider    Answer:   Tresa Garter [[id number]]  . promethazine-dextromethorphan (PROMETHAZINE-DM) 6.25-15 MG/5ML syrup    Sig: Take 5 mLs by mouth 3 (three) times daily as needed for cough.    Dispense:  240 mL    Refill:  0    Order Specific Question:   Supervising Provider    Answer:   Tresa Garter [[id number]]    Follow-up: Return in about 1 month (around 08/04/2016) for HTN with Dr. Lawerance Bach.  Alysia Penna, NP

## 2016-07-05 NOTE — Patient Instructions (Signed)
URI Instructions: Encourage adequate oral hydration. Will call with CXR results.  Use over-the-counter  "cold" medicines  such as "Tylenol cold" , "Advil cold",  "Mucinex" or" Mucinex D"  for cough and congestion.  Avoid decongestants if you have high blood pressure. Use" Delsym" or" Robitussin" cough syrup varietis for cough.  You can use plain "Tylenol" or "Advi"l for fever, chills and achyness.   "Common cold" symptoms are usually triggered by a virus.  The antibiotics are usually not necessary. On average, a" viral cold" illness would take 4-7 days to resolve. Please, make an appointment if you are not better or if you're worse.

## 2016-07-06 ENCOUNTER — Ambulatory Visit: Payer: Managed Care, Other (non HMO)

## 2016-07-26 ENCOUNTER — Telehealth: Payer: Self-pay | Admitting: Internal Medicine

## 2016-07-26 NOTE — Telephone Encounter (Signed)
There are many causes for cramping so it is hard for me to say.  It may be medication related.  Other common causes are dehydration, electrolyte abnormalities, amount of exercise he is getting.   He should make sure he is drinking plenty of water and exercising reguarly.    If symptoms continue he should come in for an appointment.

## 2016-07-26 NOTE — Telephone Encounter (Signed)
Please advise 

## 2016-07-26 NOTE — Telephone Encounter (Signed)
Patient mother called in and stated patient is having leg cramps and wants to know if that is normal due to the medication he is on. If there is something he should be taking or what he can do. Please follow up with patient. Thank you.

## 2016-07-27 NOTE — Telephone Encounter (Signed)
Spoke with pts mother to inform, while she was in office today.

## 2016-10-28 ENCOUNTER — Ambulatory Visit (INDEPENDENT_AMBULATORY_CARE_PROVIDER_SITE_OTHER): Payer: Self-pay | Admitting: Internal Medicine

## 2016-10-28 ENCOUNTER — Encounter: Payer: Self-pay | Admitting: Internal Medicine

## 2016-10-28 ENCOUNTER — Telehealth: Payer: Self-pay

## 2016-10-28 DIAGNOSIS — I10 Essential (primary) hypertension: Secondary | ICD-10-CM

## 2016-10-28 DIAGNOSIS — H66002 Acute suppurative otitis media without spontaneous rupture of ear drum, left ear: Secondary | ICD-10-CM

## 2016-10-28 DIAGNOSIS — H6692 Otitis media, unspecified, left ear: Secondary | ICD-10-CM | POA: Insufficient documentation

## 2016-10-28 MED ORDER — BUDESONIDE-FORMOTEROL FUMARATE 160-4.5 MCG/ACT IN AERO: 2.0000 | INHALATION_SPRAY | Freq: Two times a day (BID) | RESPIRATORY_TRACT | 2 refills | Status: DC

## 2016-10-28 MED ORDER — AMLODIPINE BESYLATE 5 MG PO TABS: ORAL_TABLET | ORAL | 1 refills | Status: DC

## 2016-10-28 MED ORDER — AZITHROMYCIN 250 MG PO TABS: ORAL_TABLET | ORAL | 0 refills | Status: DC

## 2016-10-28 NOTE — Progress Notes (Signed)
   Subjective:    Patient ID: Evan Burns, male    DOB: 08/14/1981, 36 y.o.   MRN: [id number]  HPI The patient is a 36 YO man coming in for left ear pain. Started 4-5 days ago. Has not tried anything for it. Minimal nasal congestion and drainage as well. No fevers or chills. No SOB or cough at all. Right ear not painful. Decreased hearing in the ear and today cannot hear well at all from it. Some swelling in his neck and pain in that area. Sore to the touch. No drainage.   Review of Systems  Constitutional: Negative for activity change, appetite change, chills, fatigue, fever and unexpected weight change.  HENT: Positive for ear pain and hearing loss. Negative for congestion, ear discharge, rhinorrhea, sinus pain, sinus pressure, sore throat and trouble swallowing.   Eyes: Negative.   Respiratory: Negative.   Cardiovascular: Negative.   Gastrointestinal: Negative.   Musculoskeletal: Negative.       Objective:   Physical Exam  HENT:  Head: Normocephalic and atraumatic.  Right Ear: External ear normal.  Nose: Nose normal.  Mouth/Throat: Oropharynx is clear and moist.  Left ear with TM bulging and some cloudy fluid  Eyes: EOM are normal.  Neck: Normal range of motion. No JVD present.  Cardiovascular: Normal rate and regular rhythm.   Pulmonary/Chest: Effort normal and breath sounds normal. No respiratory distress. He has no wheezes. He has no rales.  Abdominal: Soft.  Lymphadenopathy:    He has cervical adenopathy.   Vitals:   10/28/16 0844  BP: (!) 150/90  Pulse: 84  Resp: 14  Temp: 98.4 F (36.9 C)  TempSrc: Oral  SpO2: 99%  Weight: 223 lb (101.2 kg)  Height: 5\' 7"  (1.702 m)      Assessment & Plan:

## 2016-10-28 NOTE — Telephone Encounter (Signed)
RXs sent in 

## 2016-10-28 NOTE — Patient Instructions (Signed)
We have sent in azthromycin for the ear infection. Take 2 pills on the first day, then take 1 pill a day until they are gone.    Otitis Media, Adult Otitis media is redness, soreness, and puffiness (swelling) in the space just behind your eardrum (middle ear). It may be caused by allergies or infection. It often happens along with a cold. Follow these instructions at home:  Take your medicine as told. Finish it even if you start to feel better.  Only take over-the-counter or prescription medicines for pain, discomfort, or fever as told by your doctor.  Follow up with your doctor as told. Contact a doctor if:  You have otitis media only in one ear, or bleeding from your nose, or both.  You notice a lump on your neck.  You are not getting better in 3-5 days.  You feel worse instead of better. Get help right away if:  You have pain that is not helped with medicine.  You have puffiness, redness, or pain around your ear.  You get a stiff neck.  You cannot move part of your face (paralysis).  You notice that the bone behind your ear hurts when you touch it. This information is not intended to replace advice given to you by your health care provider. Make sure you discuss any questions you have with your health care provider. Document Released: 03/29/2008 Document Revised: 03/18/2016 Document Reviewed: 05/08/2013 Elsevier Interactive Patient Education  2017 [employer]

## 2016-10-28 NOTE — Telephone Encounter (Signed)
Patient seen today for acute visit and requested refills on symbicort and norvasc.

## 2016-10-28 NOTE — Progress Notes (Signed)
Pre visit review using our clinic review tool, if applicable. No additional management support is needed unless otherwise documented below in the visit note. 

## 2016-10-28 NOTE — Assessment & Plan Note (Signed)
Rx for azithromycin given keflex and bactrim allergy. If not improving he will call back.

## 2016-11-01 ENCOUNTER — Ambulatory Visit: Payer: BLUE CROSS/BLUE SHIELD | Admitting: Internal Medicine

## 2017-02-04 ENCOUNTER — Ambulatory Visit (INDEPENDENT_AMBULATORY_CARE_PROVIDER_SITE_OTHER): Payer: Managed Care, Other (non HMO) | Admitting: Family Medicine

## 2017-02-04 ENCOUNTER — Encounter: Payer: Self-pay | Admitting: Family Medicine

## 2017-02-04 VITALS — BP 130/72 | HR 79 | Temp 98.2°F | Resp 12 | Ht 67.0 in | Wt 226.2 lb

## 2017-02-04 DIAGNOSIS — J4520 Mild intermittent asthma, uncomplicated: Secondary | ICD-10-CM

## 2017-02-04 DIAGNOSIS — J309 Allergic rhinitis, unspecified: Secondary | ICD-10-CM

## 2017-02-04 DIAGNOSIS — J069 Acute upper respiratory infection, unspecified: Secondary | ICD-10-CM | POA: Diagnosis not present

## 2017-02-04 DIAGNOSIS — M3390 Dermatopolymyositis, unspecified, organ involvement unspecified: Secondary | ICD-10-CM | POA: Diagnosis not present

## 2017-02-04 MED ORDER — BENZONATATE 100 MG PO CAPS: 200.0000 mg | ORAL_CAPSULE | Freq: Two times a day (BID) | ORAL | 0 refills | Status: AC | PRN

## 2017-02-04 MED ORDER — BUDESONIDE-FORMOTEROL FUMARATE 160-4.5 MCG/ACT IN AERO: 2.0000 | INHALATION_SPRAY | Freq: Two times a day (BID) | RESPIRATORY_TRACT | 2 refills | Status: DC

## 2017-02-04 MED ORDER — ALBUTEROL SULFATE HFA 108 (90 BASE) MCG/ACT IN AERS: 2.0000 | INHALATION_SPRAY | Freq: Four times a day (QID) | RESPIRATORY_TRACT | 1 refills | Status: DC | PRN

## 2017-02-04 MED ORDER — CETAPHIL MOISTURIZING EX LOTN: 1.0000 "application " | TOPICAL_LOTION | CUTANEOUS | 6 refills | Status: DC | PRN

## 2017-02-04 MED ORDER — FLUTICASONE PROPIONATE 50 MCG/ACT NA SUSP: 1.0000 | Freq: Two times a day (BID) | NASAL | 3 refills | Status: DC

## 2017-02-04 NOTE — Progress Notes (Signed)
Pre visit review using our clinic review tool, if applicable. No additional management support is needed unless otherwise documented below in the visit note. 

## 2017-02-04 NOTE — Patient Instructions (Addendum)
    Mr.Drayton D Vanleeuwen I have seen you today for an acute visit.  A few things to remember from today's visit:   URI, acute - Plan: benzonatate (TESSALON) 100 MG capsule  Intermittent asthma without complication, unspecified asthma severity - Plan: albuterol (PROVENTIL HFA;VENTOLIN HFA) 108 (90 Base) MCG/ACT inhaler, budesonide-formoterol (SYMBICORT) 160-4.5 MCG/ACT inhaler  Dermatomyositis (HCC) - Plan: cetaphil (CETAPHIL) lotion  Allergic rhinitis, unspecified chronicity, unspecified seasonality, unspecified trigger - Plan: fluticasone (FLONASE) 50 MCG/ACT nasal spray   viral infections are self-limited and we treat each symptom depending of severity.  Over the counter medications as decongestants and cold medications usually help, they need to be taken with caution if there is a history of high blood pressure or palpitations. Tylenol and/or Ibuprofen also helps with most symptoms (headache, muscle aching, fever,etc) Plenty of fluids. Honey helps with cough. Steam inhalations helps with runny nose, nasal congestion, and may prevent sinus infections. Cough and nasal congestion could last a few days and sometimes weeks. Please follow in not any better in 1-2 weeks or if symptoms get worse.  Albuterol inh 2 puff every 6 hours for a week then as needed for wheezing or shortness of breath.    Nasal irrigations.     Medications prescribed today are intended for short period of time and will not be refill upon request, a follow up appointment might be necessary to discuss continuation of of treatment if appropriate.     In general please monitor for signs of worsening symptoms and seek immediate medical attention if any concerning.  If symptoms are not resolved in 1-2 weeks you should schedule a follow up appointment with your doctor, before if needed.  Please be sure you have an appointment already scheduled with your PCP before you leave today.   WE NOW OFFER   [facility] FAST TRACK!!!  SAME DAY Appointments for ACUTE CARE  Such as: Sprains, Injuries, cuts, abrasions, rashes, muscle pain, joint pain, back pain Colds, flu, sore throats, headache, allergies, cough, fever  Ear pain, sinus and eye infections Abdominal pain, nausea, vomiting, diarrhea, upset stomach Animal/insect bites  3 Easy Ways to Schedule: Walk-In Scheduling Call in scheduling Mychart Sign-up: [email or web address]

## 2017-02-04 NOTE — Progress Notes (Signed)
HPI:  ACUTE VISIT  Chief Complaint  Patient presents with  . Cough  . Headache  . Sore Throat  . chest congestion    EvanEvan Burns is a 36 y.o.male here today complaining of 5 days of respiratory symptoms.   Cough  This is a new problem. The current episode started in the past 7 days. The problem has been unchanged. The cough is non-productive. Associated symptoms include headaches, nasal congestion, postnasal drip, rhinorrhea and a sore throat. Pertinent negatives include no chills, ear congestion, ear pain, eye redness, fever, heartburn, hemoptysis, myalgias, rash, shortness of breath, sweats or wheezing. Nothing aggravates the symptoms. He has tried OTC cough suppressant for the symptoms. The treatment provided mild relief. His past medical history is significant for asthma and environmental allergies.  Headache   Associated symptoms include coughing, rhinorrhea and a sore throat. Pertinent negatives include no abdominal pain, ear pain, eye redness, fever, nausea, neck pain, vomiting or weakness.  Sore Throat   Associated symptoms include congestion, coughing and headaches. Pertinent negatives include no abdominal pain, diarrhea, ear pain, neck pain, shortness of breath, trouble swallowing or vomiting.    He has not noted fever or chills, some body aches today.  He has not noted chest pain,dyspnea, or wheezing. Chest tightness for the past 2 days.   No Hx of recent travel. No sick contact. No known insect bite.  Hx of allergies: Yes  OTC medications for this problem: Delsym.  Symptoms otherwise stable.  -Requesting refills for Symbicort and something for dry skin on face. He follows with derma and according to pt,vaseline was recommended but causes burning sensation when applied on face. Problem is stable. Hx of dermatomyositis.  She does not have Albuterol inhaler at home, he has not needed it for a while. Asthma symptoms usually well controlled with  Symbicort 160-4.5 mcg bid. Exacerbations triggered by respiratory infections.   Review of Systems  Constitutional: Positive for fatigue. Negative for activity change, appetite change, chills and fever.  HENT: Positive for congestion, postnasal drip, rhinorrhea and sore throat. Negative for ear pain, mouth sores, trouble swallowing and voice change.   Eyes: Negative for discharge, redness and itching.  Respiratory: Positive for cough and chest tightness. Negative for hemoptysis, shortness of breath and wheezing.   Cardiovascular: Negative for palpitations and leg swelling.  Gastrointestinal: Negative for abdominal pain, diarrhea, heartburn, nausea and vomiting.  Musculoskeletal: Negative for joint swelling, myalgias and neck pain.  Skin: Negative for rash.  Allergic/Immunologic: Positive for environmental allergies.  Neurological: Positive for headaches. Negative for syncope and weakness.  Hematological: Negative for adenopathy. Does not bruise/bleed easily.  Psychiatric/Behavioral: Negative for confusion. The patient is not nervous/anxious.     Current Outpatient Prescriptions on File Prior to Visit  Medication Sig Dispense Refill  . amLODipine (NORVASC) 5 MG tablet take 1 tablet by mouth once daily 90 tablet 1  . COD LIVER OIL PO Take 1 capsule by mouth daily.    Marland Kitchen losartan (COZAAR) 100 MG tablet take 1 tablet by mouth once daily 90 tablet 1  . Omega-3 Fatty Acids (FISH OIL) 1000 MG CAPS Take 1,000 mg by mouth daily.      No current facility-administered medications on file prior to visit.      Past Medical History:  Diagnosis Date  . Asthma   . Dermatomyositis (HCC)   . Hypertension    Allergies  Allergen Reactions  . Sulfonamide Derivatives     REACTION: SWELLING (in  childhood)  . Cephalexin     REACTION: vomiting  No rash or fever  . Lisinopril     REACTION: cough    Social History   Social History  . Marital status: Married    Spouse name: N/A  . Number of  children: N/A  . Years of education: N/A   Social History Main Topics  . Smoking status: Never Smoker  . Smokeless tobacco: Never Used  . Alcohol use No  . Drug use: No  . Sexual activity: Not Asked   Other Topics Concern  . None   Social History Narrative   Pt lives at home w/ parents       Very active at work - works two jobs    Vitals:   02/04/17 0908  BP: 130/72  Pulse: 79  Resp: 12  Temp: 98.2 F (36.8 C)  O2 sat 98% at RA. Body mass index is 35.44 kg/m.   Physical Exam  Nursing note and vitals reviewed. Constitutional: He is oriented to person, place, and time. He appears well-developed. He does not appear ill. No distress.  HENT:  Head: Atraumatic.  Right Ear: Tympanic membrane, external ear and ear canal normal.  Left Ear: Tympanic membrane, external ear and ear canal normal.  Nose: Rhinorrhea and septal deviation present. Right sinus exhibits no maxillary sinus tenderness and no frontal sinus tenderness. Left sinus exhibits no maxillary sinus tenderness and no frontal sinus tenderness.  Mouth/Throat: Mucous membranes are normal. Posterior oropharyngeal erythema (mild) present. No oropharyngeal exudate or posterior oropharyngeal edema.  Hypertrophic turbinate left. Post nasal drainage.  Eyes: Conjunctivae and EOM are normal.  Cardiovascular: Normal rate and regular rhythm.   No murmur heard. Respiratory: Effort normal and breath sounds normal. No stridor. No respiratory distress.  Lymphadenopathy:       Head (right side): No submandibular adenopathy present.       Head (left side): No submandibular adenopathy present.    He has no cervical adenopathy.  Neurological: He is alert and oriented to person, place, and time. He has normal strength.  Skin: Skin is warm. No rash noted. No erythema.  Dry and scaly skin,mainly face and around neck. Nodular lesions on upper extremity also appreciated.   Psychiatric: He has a normal mood and affect. His speech is  normal.  Well groomed, good eye contact.      ASSESSMENT AND PLAN:   Finas was seen today for cough, headache, sore throat and chest congestion.  Diagnoses and all orders for this visit:  URI, acute  Symptoms suggests a viral etiology,symptomatic treatment recommended, I do not think abx is needed at this time. Instructed to monitor for signs of complications, including new onset of fever among some, clearly instructed about warning signs. I also explained that cough and nasal congestion can last a few days and sometimes weeks. F/U as needed.   -     benzonatate (TESSALON) 100 MG capsule; Take 2 capsules (200 mg total) by mouth 2 (two) times daily as needed.  Intermittent asthma without complication, unspecified asthma severity  Albuterol inh 2 puff every 6 hours for a week then as needed for wheezing or shortness of breath.  Continue Symbicort twice daily. I don't think imaging is needed today. Instructed about warning signs. Follow-up with PCP.  -     albuterol (PROVENTIL HFA;VENTOLIN HFA) 108 (90 Base) MCG/ACT inhaler; Inhale 2 puffs into the lungs every 6 (six) hours as needed for wheezing or shortness of breath. -  budesonide-formoterol (SYMBICORT) 160-4.5 MCG/ACT inhaler; Inhale 2 puffs into the lungs 2 (two) times daily.  Dermatomyositis (HCC)  Continue following with a dermatologist. He could try Cetaphil lotion on face.  -     cetaphil (CETAPHIL) lotion; Apply 1 application topically as needed for dry skin.  Allergic rhinitis, unspecified chronicity, unspecified seasonality, unspecified trigger  Recommend nasal irrigations with saline. OTC antihistaminic may also help. Flonase nasal spray to use as needed. F/U with PCP as needed.   -     fluticasone (FLONASE) 50 MCG/ACT nasal spray; Place 1 spray into both nostrils 2 (two) times daily.    -Mr. [name] was advised to return or notify a doctor immediately if symptoms worsen or persist or new  concerns arise.       Mcgwire Dasaro G. [place], MD  [facility]. [facility].

## 2017-03-28 ENCOUNTER — Encounter (INDEPENDENT_AMBULATORY_CARE_PROVIDER_SITE_OTHER): Payer: Self-pay | Admitting: Orthopaedic Surgery

## 2017-03-28 ENCOUNTER — Ambulatory Visit (INDEPENDENT_AMBULATORY_CARE_PROVIDER_SITE_OTHER): Payer: Managed Care, Other (non HMO) | Admitting: Orthopaedic Surgery

## 2017-03-28 ENCOUNTER — Ambulatory Visit (INDEPENDENT_AMBULATORY_CARE_PROVIDER_SITE_OTHER): Payer: Managed Care, Other (non HMO)

## 2017-03-28 DIAGNOSIS — M79641 Pain in right hand: Secondary | ICD-10-CM

## 2017-03-28 DIAGNOSIS — M79642 Pain in left hand: Secondary | ICD-10-CM

## 2017-03-28 DIAGNOSIS — M3390 Dermatopolymyositis, unspecified, organ involvement unspecified: Secondary | ICD-10-CM

## 2017-03-28 MED ORDER — TRAMADOL HCL 50 MG PO TABS: 50.0000 mg | ORAL_TABLET | Freq: Four times a day (QID) | ORAL | 0 refills | Status: DC | PRN

## 2017-03-28 NOTE — Progress Notes (Signed)
Office Visit Note   Patient: Evan Burns           Date of Birth: 04/20/1981           MRN: [id number] Visit Date: 03/28/2017              Requested by: Pincus SanesBurns, Stacy J, MD [address] [place], [place] [id number] PCP: Pincus SanesBurns, Stacy J, MD   Assessment & Plan: Visit Diagnoses:  1. Dermatomyositis (HCC)     Plan: Tramadol as prescribed for the pain. Out of work note for a week. Referral to Dr. Mack Hookavid [facility] for further evaluation and treatment.  Follow-Up Instructions: Return if symptoms worsen or fail to improve.   Orders:  Orders Placed This Encounter  Procedures  . XR Hand Complete Left  . XR Hand Complete Right   Meds ordered this encounter  Medications  . traMADol (ULTRAM) 50 MG tablet    Sig: Take 1 tablet (50 mg total) by mouth every 6 (six) hours as needed.    Dispense:  30 tablet    Refill:  0      Procedures: No procedures performed   Clinical Data: No additional findings.   Subjective: Chief Complaint  Patient presents with  . Right Hand - Pain    Patient is a 36 year old gentleman with dermatomyositis who has a very painful nodule over the dorsum of his right index finger. He's had this pain for a week or so now. He denies any constitutional symptoms. He wants to get it checked out nature of this infected. He's had local procedures done in the past.    Review of Systems  Constitutional: Negative.   All other systems reviewed and are negative.    Objective: Vital Signs: There were no vitals taken for this visit.  Physical Exam  Constitutional: He is oriented to person, place, and time. He appears well-developed and well-nourished.  HENT:  Head: Normocephalic and atraumatic.  Eyes: Pupils are equal, round, and reactive to light.  Neck: Neck supple.  Pulmonary/Chest: Effort normal.  Abdominal: Soft.  Musculoskeletal: Normal range of motion.  Neurological: He is alert and oriented to person, place, and time.  Skin:  Skin is warm.  Psychiatric: He has a normal mood and affect. His behavior is normal. Judgment and thought content normal.  Nursing note and vitals reviewed.   Ortho Exam Right hand exam shows a very painful nodule over the dorsum of the right index finger MCP joint. He also has a nodule over the PIP joint. There is no drainage or cellulitis or signs of infection. Specialty Comments:  No specialty comments available.  Imaging: Xr Hand Complete Left  Result Date: 03/28/2017 Multiple calcium deposits  Xr Hand Complete Right  Result Date: 03/28/2017 Multiple calcific deposits    PMFS History: Patient Active Problem List   Diagnosis Date Noted  . Left otitis media 10/28/2016  . GERD 10/29/2010  . Essential hypertension 10/12/2010  . Dermatomyositis (HCC) 06/17/2008  . Asthma 02/22/2007   Past Medical History:  Diagnosis Date  . Asthma   . Dermatomyositis (HCC)   . Hypertension     Family History  Problem Relation Age of Onset  . Diabetes Unknown   . Hypertension Unknown     Past Surgical History:  Procedure Laterality Date  . HAND SURGERY     Social History   Occupational History  . Not on file.   Social History Main Topics  . Smoking status: Never Smoker  .  Smokeless tobacco: Never Used  . Alcohol use No  . Drug use: No  . Sexual activity: Not on file

## 2017-03-28 NOTE — Addendum Note (Signed)
Addended by: Albertina ParrGARCIA, Jadarrius Maselli on: 03/28/2017 01:31 PM   Modules accepted: Orders

## 2017-03-30 ENCOUNTER — Ambulatory Visit (INDEPENDENT_AMBULATORY_CARE_PROVIDER_SITE_OTHER): Payer: Self-pay | Admitting: Family

## 2017-03-30 ENCOUNTER — Ambulatory Visit (INDEPENDENT_AMBULATORY_CARE_PROVIDER_SITE_OTHER): Payer: Managed Care, Other (non HMO) | Admitting: Family

## 2017-04-12 ENCOUNTER — Other Ambulatory Visit: Payer: Self-pay | Admitting: Nurse Practitioner

## 2017-04-12 DIAGNOSIS — I10 Essential (primary) hypertension: Secondary | ICD-10-CM

## 2017-04-13 ENCOUNTER — Encounter (INDEPENDENT_AMBULATORY_CARE_PROVIDER_SITE_OTHER): Payer: Self-pay | Admitting: *Deleted

## 2017-05-13 ENCOUNTER — Ambulatory Visit (INDEPENDENT_AMBULATORY_CARE_PROVIDER_SITE_OTHER): Payer: Managed Care, Other (non HMO) | Admitting: Family

## 2017-05-13 ENCOUNTER — Encounter: Payer: Self-pay | Admitting: Family

## 2017-05-13 ENCOUNTER — Other Ambulatory Visit (INDEPENDENT_AMBULATORY_CARE_PROVIDER_SITE_OTHER): Payer: Managed Care, Other (non HMO)

## 2017-05-13 VITALS — BP 118/82 | HR 84 | Temp 98.4°F | Resp 16 | Ht 67.0 in | Wt 218.0 lb

## 2017-05-13 DIAGNOSIS — R519 Headache, unspecified: Secondary | ICD-10-CM | POA: Insufficient documentation

## 2017-05-13 DIAGNOSIS — I10 Essential (primary) hypertension: Secondary | ICD-10-CM

## 2017-05-13 DIAGNOSIS — R51 Headache: Secondary | ICD-10-CM

## 2017-05-13 DIAGNOSIS — R040 Epistaxis: Secondary | ICD-10-CM | POA: Diagnosis not present

## 2017-05-13 LAB — BASIC METABOLIC PANEL
BUN: 20 mg/dL (ref 6–23)
CHLORIDE: 106 meq/L (ref 96–112)
CO2: 28 meq/L (ref 19–32)
CREATININE: 1.19 mg/dL (ref 0.40–1.50)
Calcium: 9.2 mg/dL (ref 8.4–10.5)
GFR: 89.32 mL/min (ref >60.00)
Glucose, Bld: 92 mg/dL (ref 70–99)
POTASSIUM: 4 meq/L (ref 3.5–5.1)
SODIUM: 141 meq/L (ref 135–145)

## 2017-05-13 MED ORDER — LOSARTAN POTASSIUM 100 MG PO TABS: ORAL_TABLET | ORAL | 0 refills | Status: DC

## 2017-05-13 MED ORDER — AMLODIPINE BESYLATE 5 MG PO TABS: 5.0000 mg | ORAL_TABLET | Freq: Every day | ORAL | 0 refills | Status: DC

## 2017-05-13 NOTE — Progress Notes (Signed)
Subjective:    Patient ID: Evan Burns, male    DOB: 12/03/1980, 36 y.o.   MRN: [id number]  Chief Complaint  Patient presents with  . Headache    has been having headaches and nose bleeds, medication refills    HPI:  Evan Burns is a 36 y.o. male who  has a past medical history of Asthma; Dermatomyositis (HCC); and Hypertension. and presents today for a follow up office visit.  1.) Hypertension - Currently maintained on losartan and amlodipine and reports taking the medications as prescribed and denies adverse side effects or hypotensive readings. Does not currently check your blood pressure. Denies changes in vision, worst headache of life or new symptoms of end organ damage. Does have occasional nosebleed with a freqeuncy of twice per week. Working on following a low sodium diet.   BP Readings from Last 3 Encounters:  05/13/17 118/82  02/04/17 130/72  10/28/16 (!) 150/90   2.) Headaches - This is a new problem. Associated symptom of a headache that has been waxing and waning. Freqeuncy of headaches is 3 days out of the week and described as pounding at times. No sensitivity to light or sound; no nausea and vomiting. Modifying factors include Tylenol and Aleve which seems to help. Believes vision may have changed a little. No head injury or trauma.   Allergies  Allergen Reactions  . Sulfonamide Derivatives     REACTION: SWELLING (in childhood)  . Cephalexin     REACTION: vomiting  No rash or fever  . Lisinopril     REACTION: cough      Outpatient Medications Prior to Visit  Medication Sig Dispense Refill  . albuterol (PROVENTIL HFA;VENTOLIN HFA) 108 (90 Base) MCG/ACT inhaler Inhale 2 puffs into the lungs every 6 (six) hours as needed for wheezing or shortness of breath. 18 g 1  . budesonide-formoterol (SYMBICORT) 160-4.5 MCG/ACT inhaler Inhale 2 puffs into the lungs 2 (two) times daily. 1 Inhaler 2  . cetaphil (CETAPHIL) lotion Apply 1 application topically as  needed for dry skin. 236 mL 6  . COD LIVER OIL PO Take 1 capsule by mouth daily.    . fluticasone (FLONASE) 50 MCG/ACT nasal spray Place 1 spray into both nostrils 2 (two) times daily. 16 g 3  . Omega-3 Fatty Acids (FISH OIL) 1000 MG CAPS Take 1,000 mg by mouth daily.     . traMADol (ULTRAM) 50 MG tablet Take 1 tablet (50 mg total) by mouth every 6 (six) hours as needed. 30 tablet 0  . amLODipine (NORVASC) 5 MG tablet Take 1 tablet (5 mg total) by mouth daily. -- Office visit needed for further refills 30 tablet 0  . losartan (COZAAR) 100 MG tablet take 1 tablet by mouth once daily 90 tablet 1   No facility-administered medications prior to visit.       Past Surgical History:  Procedure Laterality Date  . HAND SURGERY        Past Medical History:  Diagnosis Date  . Asthma   . Dermatomyositis (HCC)   . Hypertension       Review of Systems  Constitutional: Negative for chills and fever.  HENT: Positive for nosebleeds.   Eyes:       Negative for changes in vision  Respiratory: Negative for cough, chest tightness and wheezing.   Cardiovascular: Negative for chest pain, palpitations and leg swelling.  Neurological: Positive for headaches. Negative for dizziness, weakness and light-headedness.      Objective:  BP 118/82 (BP Location: Left Arm, Patient Position: Sitting, Cuff Size: Large)   Pulse 84   Temp 98.4 F (36.9 C) (Oral)   Resp 16   Ht 5\' 7"  (1.702 m)   Wt 218 lb (98.9 kg)   SpO2 98%   BMI 34.14 kg/m  Nursing note and vital signs reviewed.  Physical Exam  Constitutional: He is oriented to person, place, and time. He appears well-developed and well-nourished. No distress.  Eyes: Pupils are equal, round, and reactive to light. Conjunctivae and EOM are normal.  Cardiovascular: Normal rate, regular rhythm, normal heart sounds and intact distal pulses.  Exam reveals no gallop and no friction rub.   No murmur heard. Pulmonary/Chest: Effort normal and breath  sounds normal. No respiratory distress. He has no wheezes. He has no rales. He exhibits no tenderness.  Neurological: He is alert and oriented to person, place, and time. No cranial nerve deficit.  Skin: Skin is warm and dry.  Psychiatric: He has a normal mood and affect. His behavior is normal. Judgment and thought content normal.       Assessment & Plan:   Problem List Items Addressed This Visit      Cardiovascular and Mediastinum   Essential hypertension - Primary    Pressure appears medically controlled current medication regimen and no adverse side effects. Possibly related to nosebleed and headaches although appears well controlled on unlikely. Continue current dosage of losartan and amlodipine. Encouraged to monitor blood pressure at home and follow low-sodium diet.      Relevant Medications   losartan (COZAAR) 100 MG tablet   amLODipine (NORVASC) 5 MG tablet   Other Relevant Orders   Basic Metabolic Panel (BMET) (Completed)     Other   Generalized headache    Generalized headache adequately controlled with Tylenol and ibuprofen with no significant findings on neurological exam. Continue conservative treatment with over-the-counter medications as needed for symptom relief and follow-up if symptoms increase in frequency or intensity. Denies worse headache of life.      Relevant Medications   amLODipine (NORVASC) 5 MG tablet   Nosebleed    Occasional nosebleed most likely related to dry nasal passages with no significant findings on exam. Recommend saline nasal sprays. Unlikely related to blood pressure. Follow up if symptoms worsen or frequency increases.           I have changed [name] amLODipine. I am also having him maintain his Fish Oil, COD LIVER OIL PO, cetaphil, fluticasone, albuterol, budesonide-formoterol, traMADol, and losartan.   Meds ordered this encounter  Medications  . losartan (COZAAR) 100 MG tablet    Sig: take 1 tablet by mouth once daily     Dispense:  90 tablet    Refill:  0  . amLODipine (NORVASC) 5 MG tablet    Sig: Take 1 tablet (5 mg total) by mouth daily.    Dispense:  90 tablet    Refill:  0     Follow-up: Return in about 3 months (around 08/13/2017), or if symptoms worsen or fail to improve.  Jeanine Luz, FNP

## 2017-05-13 NOTE — Assessment & Plan Note (Signed)
Pressure appears medically controlled current medication regimen and no adverse side effects. Possibly related to nosebleed and headaches although appears well controlled on unlikely. Continue current dosage of losartan and amlodipine. Encouraged to monitor blood pressure at home and follow low-sodium diet.

## 2017-05-13 NOTE — Patient Instructions (Signed)
Thank you for choosing [facility].  SUMMARY AND INSTRUCTIONS:  Recommend saline nasal spray like Simply Saline for your nosebleeds. If they continue please let us know.  For the muscle cramping: Magnesium 200 mg daily (Available over the counter)  Continue to take your blood pressure medications as prescribed and follow a low sodium diet.   Headaches - Tylenol and ibuprofen as needed. If frequency or intensity increase please follow up.  Medication:  Your prescription(s) have been submitted to your pharmacy or been printed and provided for you. Please take as directed and contact our office if you believe you are having problem(s) with the medication(s) or have any questions.  Labs:  Please stop by the lab on the lower level of the building for your blood work. Your results will be released to MyChart (or called to you) after review, usually within 72 hours after test completion. If any changes need to be made, you will be notified at that same time.  1.) The lab is open from 7:30am to 5:30 pm Monday-Friday 2.) No appointment is necessary 3.) Fasting (if needed) is 6-8 hours after food and drink; black coffee and water are okay    Follow up:  If your symptoms worsen or fail to improve, please contact our office for further instruction, or in case of emergency go directly to the emergency room at the closest medical facility.

## 2017-05-13 NOTE — Assessment & Plan Note (Signed)
Occasional nosebleed most likely related to dry nasal passages with no significant findings on exam. Recommend saline nasal sprays. Unlikely related to blood pressure. Follow up if symptoms worsen or frequency increases.

## 2017-05-13 NOTE — Assessment & Plan Note (Signed)
Generalized headache adequately controlled with Tylenol and ibuprofen with no significant findings on neurological exam. Continue conservative treatment with over-the-counter medications as needed for symptom relief and follow-up if symptoms increase in frequency or intensity. Denies worse headache of life.

## 2017-05-28 ENCOUNTER — Encounter: Payer: Self-pay | Admitting: *Deleted

## 2017-05-28 ENCOUNTER — Encounter: Payer: Self-pay | Admitting: Internal Medicine

## 2017-05-28 ENCOUNTER — Ambulatory Visit (INDEPENDENT_AMBULATORY_CARE_PROVIDER_SITE_OTHER): Payer: Managed Care, Other (non HMO) | Admitting: Internal Medicine

## 2017-05-28 VITALS — BP 140/90 | HR 92 | Temp 98.4°F | Ht 67.0 in | Wt 219.2 lb

## 2017-05-28 DIAGNOSIS — J0100 Acute maxillary sinusitis, unspecified: Secondary | ICD-10-CM

## 2017-05-28 MED ORDER — AMOXICILLIN 500 MG PO TABS: 1000.0000 mg | ORAL_TABLET | Freq: Two times a day (BID) | ORAL | 0 refills | Status: AC

## 2017-05-28 NOTE — Assessment & Plan Note (Signed)
Discussed that this is still likely viral No exacerbation of his asthma Discussed analgesics If worsens, would start amoxil
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
## 2017-05-28 NOTE — Patient Instructions (Signed)
Please get plenty of rest and try over the counter pain relievers. If you are worse over the next couple of days, then start the antibiotic You should not go back to work till Monday 8/6

## 2017-08-06 ENCOUNTER — Ambulatory Visit (INDEPENDENT_AMBULATORY_CARE_PROVIDER_SITE_OTHER): Payer: Managed Care, Other (non HMO)

## 2017-08-06 ENCOUNTER — Encounter (INDEPENDENT_AMBULATORY_CARE_PROVIDER_SITE_OTHER): Payer: Self-pay

## 2017-08-06 DIAGNOSIS — Z23 Encounter for immunization: Secondary | ICD-10-CM | POA: Diagnosis not present

## 2017-08-26 ENCOUNTER — Telehealth: Payer: Self-pay | Admitting: Internal Medicine

## 2017-08-26 DIAGNOSIS — I10 Essential (primary) hypertension: Secondary | ICD-10-CM

## 2017-08-26 MED ORDER — LOSARTAN POTASSIUM 100 MG PO TABS: ORAL_TABLET | ORAL | 0 refills | Status: DC

## 2017-08-26 MED ORDER — AMLODIPINE BESYLATE 5 MG PO TABS
5.0000 mg | ORAL_TABLET | Freq: Every day | ORAL | 0 refills | Status: DC
Start: 2017-08-26 — End: 2017-09-08

## 2017-08-26 NOTE — Telephone Encounter (Signed)
Per office policy sent 30 day to local pharmacy until appt.../lmb  

## 2017-08-26 NOTE — Telephone Encounter (Signed)
Patient requesting refills on losartan and amlodipine to be [facility].  Patient has scheduled CPE for 11/15.  Please fill to this date. Patient is out of medication.

## 2017-09-08 ENCOUNTER — Encounter: Payer: Self-pay | Admitting: Internal Medicine

## 2017-09-08 ENCOUNTER — Ambulatory Visit (INDEPENDENT_AMBULATORY_CARE_PROVIDER_SITE_OTHER): Payer: Managed Care, Other (non HMO) | Admitting: Internal Medicine

## 2017-09-08 ENCOUNTER — Other Ambulatory Visit (INDEPENDENT_AMBULATORY_CARE_PROVIDER_SITE_OTHER): Payer: Managed Care, Other (non HMO)

## 2017-09-08 VITALS — BP 130/84 | HR 79 | Temp 98.9°F | Resp 16 | Ht 67.0 in | Wt 225.0 lb

## 2017-09-08 DIAGNOSIS — R739 Hyperglycemia, unspecified: Secondary | ICD-10-CM | POA: Insufficient documentation

## 2017-09-08 DIAGNOSIS — I10 Essential (primary) hypertension: Secondary | ICD-10-CM | POA: Diagnosis not present

## 2017-09-08 DIAGNOSIS — J4520 Mild intermittent asthma, uncomplicated: Secondary | ICD-10-CM

## 2017-09-08 DIAGNOSIS — Z0000 Encounter for general adult medical examination without abnormal findings: Secondary | ICD-10-CM

## 2017-09-08 DIAGNOSIS — M3390 Dermatopolymyositis, unspecified, organ involvement unspecified: Secondary | ICD-10-CM

## 2017-09-08 LAB — CBC WITH DIFFERENTIAL/PLATELET
BASOS ABS: 0.1 10*3/uL (ref 0.0–0.1)
BASOS PCT: 1.9 % (ref 0.0–3.0)
Eosinophils Absolute: 0.2 10*3/uL (ref 0.0–0.7)
Eosinophils Relative: 4.2 % (ref 0.0–5.0)
HEMATOCRIT: 42.8 % (ref 39.0–52.0)
Hemoglobin: 13.9 g/dL (ref 13.0–17.0)
LYMPHS ABS: 1.5 10*3/uL (ref 0.7–4.0)
LYMPHS PCT: 35.3 % (ref 12.0–46.0)
MCHC: 32.5 g/dL (ref 30.0–36.0)
MCV: 84.9 fl (ref 78.0–100.0)
MONOS PCT: 9 % (ref 3.0–12.0)
Monocytes Absolute: 0.4 10*3/uL (ref 0.1–1.0)
NEUTROS ABS: 2.1 10*3/uL (ref 1.4–7.7)
NEUTROS PCT: 49.6 % (ref 43.0–77.0)
PLATELETS: 234 10*3/uL (ref 150.0–400.0)
RBC: 5.04 Mil/uL (ref 4.22–5.81)
RDW: 12.9 % (ref 11.5–15.5)
WBC: 4.2 10*3/uL (ref 4.0–10.5)

## 2017-09-08 LAB — LIPID PANEL
CHOL/HDL RATIO: 2
Cholesterol: 117 mg/dL (ref 0–200)
HDL: 52.9 mg/dL (ref >39.00)
LDL Cholesterol: 57 mg/dL (ref 0–99)
NONHDL: 64.32
TRIGLYCERIDES: 38 mg/dL (ref 0.0–149.0)
VLDL: 7.6 mg/dL (ref 0.0–40.0)

## 2017-09-08 LAB — COMPREHENSIVE METABOLIC PANEL
ALT: 29 U/L (ref 0–53)
AST: 22 U/L (ref 0–37)
Albumin: 4.1 g/dL (ref 3.5–5.2)
Alkaline Phosphatase: 43 U/L (ref 39–117)
BILIRUBIN TOTAL: 0.4 mg/dL (ref 0.2–1.2)
BUN: 20 mg/dL (ref 6–23)
CALCIUM: 9.3 mg/dL (ref 8.4–10.5)
CHLORIDE: 104 meq/L (ref 96–112)
CO2: 29 meq/L (ref 19–32)
Creatinine, Ser: 1.06 mg/dL (ref 0.40–1.50)
GFR: 101.89 mL/min (ref >60.00)
GLUCOSE: 105 mg/dL — AB (ref 70–99)
POTASSIUM: 4.6 meq/L (ref 3.5–5.1)
Sodium: 140 mEq/L (ref 135–145)
Total Protein: 6.5 g/dL (ref 6.0–8.3)

## 2017-09-08 LAB — TSH: TSH: 1.35 u[IU]/mL (ref 0.35–4.50)

## 2017-09-08 MED ORDER — AMLODIPINE BESYLATE 5 MG PO TABS: 5.0000 mg | ORAL_TABLET | Freq: Every day | ORAL | 3 refills | Status: DC

## 2017-09-08 MED ORDER — LOSARTAN POTASSIUM 100 MG PO TABS: ORAL_TABLET | ORAL | 3 refills | Status: DC

## 2017-09-08 NOTE — Assessment & Plan Note (Signed)
Controlled Using symbicort only prn Has not needed albuterol in a while

## 2017-09-08 NOTE — Assessment & Plan Note (Addendum)
Sometimes achiness in morning During day no symptoms Otherwise no symptoms Not seeing anyone now

## 2017-09-08 NOTE — Progress Notes (Signed)
Subjective:    Patient ID: Evan Burns, Evan Burns    DOB: 01/27/1981, 36 y.o.   MRN: [id number]  HPI He is here for a physical exam.   He has no concerns  Medications and allergies reviewed with patient and updated if appropriate.  Patient Active Problem List   Diagnosis Date Noted  . Generalized headache 05/13/2017  . Nosebleed 05/13/2017  . GERD 10/29/2010  . Essential hypertension 10/12/2010  . Dermatomyositis (HCC) 06/17/2008  . Asthma 02/22/2007    Current Outpatient Medications on File Prior to Visit  Medication Sig Dispense Refill  . albuterol (PROVENTIL HFA;VENTOLIN HFA) 108 (90 Base) MCG/ACT inhaler Inhale 2 puffs into the lungs every 6 (six) hours as needed for wheezing or shortness of breath. 18 g 1  . amLODipine (NORVASC) 5 MG tablet Take 1 tablet (5 mg total) by mouth daily. Must keep appt for future refills 30 tablet 0  . budesonide-formoterol (SYMBICORT) 160-4.5 MCG/ACT inhaler Inhale 2 puffs into the lungs 2 (two) times daily. 1 Inhaler 2  . cetaphil (CETAPHIL) lotion Apply 1 application topically as needed for dry skin. 236 mL 6  . COD LIVER OIL PO Take 1 capsule by mouth daily.    . fluticasone (FLONASE) 50 MCG/ACT nasal spray Place 1 spray into both nostrils 2 (two) times daily. 16 g 3  . losartan (COZAAR) 100 MG tablet take 1 tablet by mouth once daily. Must keep appt for future refills 30 tablet 0  . Omega-3 Fatty Acids (FISH OIL) 1000 MG CAPS Take 1,000 mg by mouth daily.      No current facility-administered medications on file prior to visit.     Past Medical History:  Diagnosis Date  . Asthma   . Dermatomyositis (HCC)   . Hypertension     Past Surgical History:  Procedure Laterality Date  . HAND SURGERY      Social History   Socioeconomic History  . Marital status: Married    Spouse name: None  . Number of children: None  . Years of education: None  . Highest education level: None  Social Needs  . Financial resource strain: None  .  Food insecurity - worry: None  . Food insecurity - inability: None  . Transportation needs - medical: None  . Transportation needs - non-medical: None  Occupational History  . None  Tobacco Use  . Smoking status: Never Smoker  . Smokeless tobacco: Never Used  Substance and Sexual Activity  . Alcohol use: No  . Drug use: No  . Sexual activity: None  Other Topics Concern  . None  Social History Narrative   Pt lives at home w/ parents       Very active at work - works two jobs    Family History  Problem Relation Age of Onset  . Diabetes Unknown   . Hypertension Unknown     Review of Systems  Constitutional: Negative for chills and fever.  Eyes: Negative for visual disturbance.  Respiratory: Negative for cough, shortness of breath and wheezing.   Cardiovascular: Positive for palpitations (occ) and leg swelling (occ). Negative for chest pain.  Gastrointestinal: Negative for abdominal pain, blood in stool, constipation, diarrhea and nausea.  Genitourinary: Negative for dysuria and hematuria.  Musculoskeletal: Positive for arthralgias (occ in morning - achy) and back pain (sometimes).  Skin: Negative for color change and rash.  Neurological: Negative for light-headedness and headaches.  Psychiatric/Behavioral: Negative for dysphoric mood. The patient is not nervous/anxious.  Objective:   Vitals:   09/08/17 0812  BP: 130/84  Pulse: 79  Resp: 16  Temp: 98.9 F (37.2 C)  SpO2: 98%   Filed Weights   09/08/17 0812  Weight: 225 lb (102.1 kg)   Body mass index is 35.24 kg/m.  Wt Readings from Last 3 Encounters:  09/08/17 225 lb (102.1 kg)  05/28/17 219 lb 4 oz (99.5 kg)  05/13/17 218 lb (98.9 kg)     Physical Exam Constitutional: He appears well-developed and well-nourished. No distress.  HENT:  Head: Normocephalic and atraumatic.  Right Ear: External ear normal.  Left Ear: External ear normal.  Mouth/Throat: Oropharynx is clear and moist.  Normal ear  canals and TM b/l  Eyes: Conjunctivae and EOM are normal.  Neck: Neck supple. No tracheal deviation present. No thyromegaly present.  No carotid bruit  Cardiovascular: Normal rate, regular rhythm, normal heart sounds and intact distal pulses.   No murmur heard. Pulmonary/Chest: Effort normal and breath sounds normal. No respiratory distress. He has no wheezes. He has no rales.  Abdominal: Soft. He exhibits no distension. There is no tenderness.  Genitourinary: deferred  Musculoskeletal: He exhibits no edema.  Lymphadenopathy:   He has no cervical adenopathy.  Skin: Skin is warm and dry. He is not diaphoretic.  Psychiatric: He has a normal mood and affect. His behavior is normal.       Assessment & Plan:   Physical exam: Screening blood work   ordered Immunizations  Up to date Exercise  Gym 3-4 times a week Weight overweight - advised weight loss Skin  No concerns Substance abuse  none  See Problem List for Assessment and Plan of chronic medical problems.

## 2017-09-08 NOTE — Assessment & Plan Note (Signed)
BP well controlled Current regimen effective and well tolerated Continue current medications at current doses cmp  

## 2017-09-08 NOTE — Patient Instructions (Addendum)
Test(s) ordered today. Your results will be released to MyChart (or called to you) after review, usually within 72hours after test completion. If any changes need to be made, you will be notified at that same time.  All other Health Maintenance issues reviewed.   All recommended immunizations and age-appropriate screenings are up-to-date or discussed.  No immunizations administered today.   Medications reviewed and updated.  No changes recommended at this time.  Your prescription(s) have been submitted to your pharmacy. Please take as directed and contact our office if you believe you are having problem(s) with the medication(s).   Please followup in 6 months    Health Maintenance, Male A healthy lifestyle and preventive care is important for your health and wellness. Ask your health care provider about what schedule of regular examinations is right for you. What should I know about weight and diet? Eat a Healthy Diet  Eat plenty of vegetables, fruits, whole grains, low-fat dairy products, and lean protein.  Do not eat a lot of foods high in solid fats, added sugars, or salt.  Maintain a Healthy Weight Regular exercise can help you achieve or maintain a healthy weight. You should:  Do at least 150 minutes of exercise each week. The exercise should increase your heart rate and make you sweat (moderate-intensity exercise).  Do strength-training exercises at least twice a week.  Watch Your Levels of Cholesterol and Blood Lipids  Have your blood tested for lipids and cholesterol every 5 years starting at 35 years of age. If you are at high risk for heart disease, you should start having your blood tested when you are 35 years old. You may need to have your cholesterol levels checked more often if: ? Your lipid or cholesterol levels are high. ? You are older than 35 years of age. ? You are at high risk for heart disease.  What should I know about cancer screening? Many types of  cancers can be detected early and may often be prevented. Lung Cancer  You should be screened every year for lung cancer if: ? You are a current smoker who has smoked for at least 30 years. ? You are a former smoker who has quit within the past 15 years.  Talk to your health care provider about your screening options, when you should start screening, and how often you should be screened.  Colorectal Cancer  Routine colorectal cancer screening usually begins at 35 years of age and should be repeated every 5-10 years until you are 35 years old. You may need to be screened more often if early forms of precancerous polyps or small growths are found. Your health care provider may recommend screening at an earlier age if you have risk factors for colon cancer.  Your health care provider may recommend using home test kits to check for hidden blood in the stool.  A small camera at the end of a tube can be used to examine your colon (sigmoidoscopy or colonoscopy). This checks for the earliest forms of colorectal cancer.  Prostate and Testicular Cancer  Depending on your age and overall health, your health care provider may do certain tests to screen for prostate and testicular cancer.  Talk to your health care provider about any symptoms or concerns you have about testicular or prostate cancer.  Skin Cancer  Check your skin from head to toe regularly.  Tell your health care provider about any new moles or changes in moles, especially if: ? There is a   change in a mole's size, shape, or color. ? You have a mole that is larger than a pencil eraser.  Always use sunscreen. Apply sunscreen liberally and repeat throughout the day.  Protect yourself by wearing long sleeves, pants, a wide-brimmed hat, and sunglasses when outside.  What should I know about heart disease, diabetes, and high blood pressure?  If you are 18-39 years of age, have your blood pressure checked every 3-5 years. If you are  40 years of age or older, have your blood pressure checked every year. You should have your blood pressure measured twice-once when you are at a hospital or clinic, and once when you are not at a hospital or clinic. Record the average of the two measurements. To check your blood pressure when you are not at a hospital or clinic, you can use: ? An automated blood pressure machine at a pharmacy. ? A home blood pressure monitor.  Talk to your health care provider about your target blood pressure.  If you are between 45-79 years old, ask your health care provider if you should take aspirin to prevent heart disease.  Have regular diabetes screenings by checking your fasting blood sugar level. ? If you are at a normal weight and have a low risk for diabetes, have this test once every three years after the age of 45. ? If you are overweight and have a high risk for diabetes, consider being tested at a younger age or more often.  A one-time screening for abdominal aortic aneurysm (AAA) by ultrasound is recommended for men aged 65-75 years who are current or former smokers. What should I know about preventing infection? Hepatitis B If you have a higher risk for hepatitis B, you should be screened for this virus. Talk with your health care provider to find out if you are at risk for hepatitis B infection. Hepatitis C Blood testing is recommended for:  Everyone born from 1945 through 1965.  Anyone with known risk factors for hepatitis C.  Sexually Transmitted Diseases (STDs)  You should be screened each year for STDs including gonorrhea and chlamydia if: ? You are sexually active and are younger than 35 years of age. ? You are older than 35 years of age and your health care provider tells you that you are at risk for this type of infection. ? Your sexual activity has changed since you were last screened and you are at an increased risk for chlamydia or gonorrhea. Ask your health care provider if you  are at risk.  Talk with your health care provider about whether you are at high risk of being infected with HIV. Your health care provider may recommend a prescription medicine to help prevent HIV infection.  What else can I do?  Schedule regular health, dental, and eye exams.  Stay current with your vaccines (immunizations).  Do not use any tobacco products, such as cigarettes, chewing tobacco, and e-cigarettes. If you need help quitting, ask your health care provider.  Limit alcohol intake to no more than 2 drinks per day. One drink equals 12 ounces of beer, 5 ounces of wine, or 1 ounces of hard liquor.  Do not use street drugs.  Do not share needles.  Ask your health care provider for help if you need support or information about quitting drugs.  Tell your health care provider if you often feel depressed.  Tell your health care provider if you have ever been abused or do not feel safe at home.   This information is not intended to replace advice given to you by your health care provider. Make sure you discuss any questions you have with your health care provider. Document Released: 04/08/2008 Document Revised: 06/09/2016 Document Reviewed: 07/15/2015 Elsevier Interactive Patient Education  2018 Elsevier Inc.  

## 2017-12-07 ENCOUNTER — Ambulatory Visit: Payer: Managed Care, Other (non HMO) | Admitting: Internal Medicine

## 2017-12-07 ENCOUNTER — Encounter: Payer: Self-pay | Admitting: Internal Medicine

## 2017-12-07 VITALS — BP 138/88 | HR 90 | Temp 98.9°F | Resp 16 | Wt 227.0 lb

## 2017-12-07 DIAGNOSIS — J4531 Mild persistent asthma with (acute) exacerbation: Secondary | ICD-10-CM | POA: Diagnosis not present

## 2017-12-07 DIAGNOSIS — J22 Unspecified acute lower respiratory infection: Secondary | ICD-10-CM

## 2017-12-07 MED ORDER — METHYLPREDNISOLONE 4 MG PO TBPK
ORAL_TABLET | ORAL | 0 refills | Status: DC
Start: 2017-12-07 — End: 2018-11-21

## 2017-12-07 MED ORDER — AZITHROMYCIN 250 MG PO TABS: ORAL_TABLET | ORAL | 0 refills | Status: DC

## 2017-12-07 NOTE — Assessment & Plan Note (Signed)
Respiratory infection, concerning for bacterial cause with flareup of his asthma Start Z-Pak, Medrol Dosepak Continue Symbicort daily Albuterol as needed Continue Flonase Continue increased rest and fluids Call if no improvement Note given for work

## 2017-12-07 NOTE — Patient Instructions (Addendum)
Take the zpak as prescribed.   Use your inhalers.  Take the steroids as prescribed.    Your prescription(s) have been submitted to your pharmacy or been printed and provided for you. Please take as directed and contact our office if you believe you are having problem(s) with the medication(s) or have any questions.  If your symptoms worsen or fail to improve, please contact our office for further instruction, or in case of emergency go directly to the emergency room at the closest medical facility.   General Recommendations:    Please drink plenty of fluids.  Get plenty of rest   Sleep in humidified air  Use saline nasal sprays  Netti pot  OTC Medications:  Decongestants - helps relieve congestion   Flonase (generic fluticasone) or Nasacort (generic triamcinolone) - please make sure to use the "cross-over" technique at a 45 degree angle towards the opposite eye as opposed to straight up the nasal passageway.   Sudafed (generic pseudoephedrine - Note this is the one that is available behind the pharmacy counter); Products with phenylephrine (-PE) may also be used but is often not as effective as pseudoephedrine.   If you have HIGH BLOOD PRESSURE - Coricidin HBP; AVOID any product that is -D as this contains pseudoephedrine which may increase your blood pressure.  Afrin (oxymetazoline) every 6-8 hours for up to 3 days.  Allergies - helps relieve runny nose, itchy eyes and sneezing   Claritin (generic loratidine), Allegra (fexofenidine), or Zyrtec (generic cyrterizine) for runny nose. These medications should not cause drowsiness.  Note - Benadryl (generic diphenhydramine) may be used however may cause drowsiness  Cough -   Delsym or Robitussin (generic dextromethorphan)  Expectorants - helps loosen mucus to ease removal   Mucinex (generic guaifenesin) as directed on the package.  Headaches / General Aches   Tylenol (generic acetaminophen) - DO NOT  EXCEED 3 grams (3,000 mg) in a 24 hour time period  Advil/Motrin (generic ibuprofen)  Sore Throat -   Salt water gargle   Chloraseptic (generic benzocaine) spray or lozenges / Sucrets (generic dyclonine)

## 2017-12-07 NOTE — Progress Notes (Signed)
Subjective:    Patient ID: Evan Burns, male    DOB: [birth year], 37 y.o.   MRN: [id number]  HPI He is here for an acute visit for cold symptoms.  His symptoms started over one week ago, approximately 10 days ago.  He is experiencing nasal congestion, sneezing, sore throat from coughing only, cough, shortness of breath, wheezing, headaches, lightheadedness, dizziness.  He denies any fevers, chills, sinus pain or ear pain.  He has body aches only when he sneezes.  He does have asthma and takes the Symbicort daily.  He has had to use the albuterol a couple of times and it helped, but only transiently.  He has tried taking albuterol, robitussin, dayquil.   He has been out of work and will need a work note.  Medications and allergies reviewed with patient and updated if appropriate.  Patient Active Problem List   Diagnosis Date Noted  . Hyperglycemia 09/08/2017  . Essential hypertension 10/12/2010  . Dermatomyositis (HCC) 06/17/2008  . Asthma 02/22/2007    Current Outpatient Medications on File Prior to Visit  Medication Sig Dispense Refill  . albuterol (PROVENTIL HFA;VENTOLIN HFA) 108 (90 Base) MCG/ACT inhaler Inhale 2 puffs into the lungs every 6 (six) hours as needed for wheezing or shortness of breath. 18 g 1  . amLODipine (NORVASC) 5 MG tablet Take 1 tablet (5 mg total) daily by mouth. 90 tablet 3  . budesonide-formoterol (SYMBICORT) 160-4.5 MCG/ACT inhaler Inhale 2 puffs into the lungs 2 (two) times daily. 1 Inhaler 2  . cetaphil (CETAPHIL) lotion Apply 1 application topically as needed for dry skin. 236 mL 6  . COD LIVER OIL PO Take 1 capsule by mouth daily.    . fluticasone (FLONASE) 50 MCG/ACT nasal spray Place 1 spray into both nostrils 2 (two) times daily. 16 g 3  . losartan (COZAAR) 100 MG tablet take 1 tablet by mouth once daily. 90 tablet 3  . Omega-3 Fatty Acids (FISH OIL) 1000 MG CAPS Take 1,000 mg by mouth daily.      No current facility-administered  medications on file prior to visit.     Past Medical History:  Diagnosis Date  . Asthma   . Dermatomyositis (HCC)   . Hypertension     Past Surgical History:  Procedure Laterality Date  . HAND SURGERY      Social History   Socioeconomic History  . Marital status: Married    Spouse name: None  . Number of children: None  . Years of education: None  . Highest education level: None  Social Needs  . Financial resource strain: None  . Food insecurity - worry: None  . Food insecurity - inability: None  . Transportation needs - medical: None  . Transportation needs - non-medical: None  Occupational History  . None  Tobacco Use  . Smoking status: Never Smoker  . Smokeless tobacco: Never Used  Substance and Sexual Activity  . Alcohol use: No  . Drug use: No  . Sexual activity: None  Other Topics Concern  . None  Social History Narrative   Pt lives at home w/ parents       Very active at work - works two jobs    Family History  Problem Relation Age of Onset  . Hypertension Mother   . Diabetes Mother   . Kidney disease Mother   . Hyperlipidemia Mother   . Hypertension Father   . Hyperlipidemia Father     Review of Systems  Constitutional: Negative for chills and fever.  HENT: Positive for congestion and sneezing. Negative for ear pain, sinus pressure, sinus pain and sore throat (from cough only).   Respiratory: Positive for cough (dry), shortness of breath (little) and wheezing (little).   Musculoskeletal: Positive for myalgias (with sneezing only).  Neurological: Positive for dizziness (last night), light-headedness and headaches.       Objective:   Vitals:   12/07/17 1043  BP: 138/88  Pulse: 90  Resp: 16  Temp: 98.9 F (37.2 C)  SpO2: 98%   Filed Weights   12/07/17 1043  Weight: 227 lb (103 kg)   Body mass index is 35.55 kg/m.  Wt Readings from Last 3 Encounters:  12/07/17 227 lb (103 kg)  09/08/17 225 lb (102.1 kg)  05/28/17 219 lb 4 oz  (99.5 kg)     Physical Exam GENERAL APPEARANCE: Appears stated age, well appearing, NAD EYES: conjunctiva clear, no icterus HEENT: bilateral tympanic membranes and ear canals normal, oropharynx with no erythema, no thyromegaly, trachea midline, no cervical or supraclavicular lymphadenopathy LUNGS: Unlabored breathing, good air entry bilaterally, minimal expiratory wheeze on exam, no crackles CARDIOVASCULAR: Normal S1,S2 without murmurs, no edema SKIN: warm, dry        Assessment & Plan:   See Problem List for Assessment and Plan of chronic medical problems.

## 2017-12-07 NOTE — Assessment & Plan Note (Signed)
Secondary to lower respiratory tract infection Start Z-Pak, Medrol Dosepak Continue Symbicort daily Albuterol as needed Continue Flonase Continue increased rest and fluids Call if no improvement Note given for work

## 2018-03-10 ENCOUNTER — Ambulatory Visit: Payer: Managed Care, Other (non HMO) | Admitting: Internal Medicine

## 2018-04-14 ENCOUNTER — Ambulatory Visit: Payer: Managed Care, Other (non HMO) | Admitting: Podiatry

## 2018-06-06 ENCOUNTER — Telehealth: Payer: Self-pay | Admitting: Internal Medicine

## 2018-06-06 NOTE — Telephone Encounter (Signed)
Pt brought in Biometric Form from his employer, [employer] for completion.  Pt's last CPE was 09/08/17.  Per pt, please fax completed form to [phone].  Form placed in Dr. Lawerance BachBurns' box for pick up & completion.

## 2018-06-06 NOTE — Telephone Encounter (Signed)
Form is in MD's folder for completion.

## 2018-06-07 NOTE — Telephone Encounter (Signed)
LVM informing pt form has been completed. Form needs pt's signature before it can be faxed. Placed upfront for pick up.

## 2018-08-04 ENCOUNTER — Ambulatory Visit (INDEPENDENT_AMBULATORY_CARE_PROVIDER_SITE_OTHER): Payer: Managed Care, Other (non HMO)

## 2018-08-04 DIAGNOSIS — Z23 Encounter for immunization: Secondary | ICD-10-CM

## 2018-11-13 ENCOUNTER — Other Ambulatory Visit: Payer: Self-pay | Admitting: Internal Medicine

## 2018-11-13 DIAGNOSIS — I10 Essential (primary) hypertension: Secondary | ICD-10-CM

## 2018-11-20 ENCOUNTER — Other Ambulatory Visit: Payer: Self-pay | Admitting: Internal Medicine

## 2018-11-20 DIAGNOSIS — I10 Essential (primary) hypertension: Secondary | ICD-10-CM

## 2018-11-20 NOTE — Telephone Encounter (Signed)
Copied from CRM [id number]. Topic: Quick Communication - Rx Refill/Question >> Nov 20, 2018 11:36 AM Saverio Danker J wrote: Medication: amLODipine (NORVASC) 5 MG tablet was called in 01/20, but it was for 30 days.  Pt insurance only pays for 90 days. Also needs losartan (COZAAR) 100 MG tablet filled   Has the patient contacted their pharmacy? Yes.   (Agent: If no, request that the patient contact the pharmacy for the refill.) (Agent: If yes, when and what did the pharmacy advise?)  Preferred Pharmacy (with phone number or street name): [facility]  Pharm told pt to call us   Agent: Please be advised that RX refills may take up to 3 business days. We ask that you follow-up with your pharmacy.

## 2018-11-21 MED ORDER — LOSARTAN POTASSIUM 100 MG PO TABS
ORAL_TABLET | ORAL | 0 refills | Status: DC
Start: 2018-11-21 — End: 2018-11-22

## 2018-11-21 NOTE — Telephone Encounter (Signed)
Attempted to contact pt; refill request for losartan and amlodipine; amlodipine filled 11/13/2018 #30; pharmacy note reads, "need office visit for more refills"; no upcoming visits noted; attempted to contact pt; left message on voicemail [phone]; will route to office for final disposition.  Requested Prescriptions  Pending Prescriptions Disp Refills  . amLODipine (NORVASC) 5 MG tablet 30 tablet 0    Sig: Take 1 by mouth daily. Need office visit for more refills.     Cardiovascular:  Calcium Channel Blockers Failed - 11/20/2018 12:01 PM      Failed - Valid encounter within last 6 months    Recent Outpatient Visits          11 months ago Mild persistent asthma with exacerbation   [facility] -[place] Saa, Bobette Mo, MD   1 year ago Preventative health care   [facility] -[place] Saa, Bobette Mo, MD   1 year ago Acute non-recurrent maxillary sinusitis   [facility] -Fransisco Hertz, MD   1 year ago Essential hypertension   [facility] -Olive Bass, FNP   1 year ago URI, acute   [facility], Timoteo Expose, MD             Passed - Last BP in normal range    BP Readings from Last 1 Encounters:  12/07/17 138/88       Signed Prescriptions Disp Refills   losartan (COZAAR) 100 MG tablet 30 tablet 0    Sig: take 1 tablet by mouth once daily.     Cardiovascular:  Angiotensin Receptor Blockers Failed - 11/20/2018 12:01 PM      Failed - Cr in normal range and within 180 days    Creatinine, Ser  Date Value Ref Range Status  09/08/2017 1.06 0.40 - 1.50 mg/dL Final         Failed - K in normal range and within 180 days    Potassium  Date Value Ref Range Status  09/08/2017 4.6 3.5 - 5.1 mEq/L Final         Failed - Valid encounter within last 6 months    Recent Outpatient Visits          11 months ago Mild persistent asthma with exacerbation   [facility] -Kieth Brightly, MD   1 year ago Preventative health care   [facility] -Kieth Brightly, MD   1 year ago Acute non-recurrent maxillary sinusitis   [facility] -Fransisco Hertz, MD   1 year ago Essential hypertension   [facility] -Olive Bass, FNP   1 year ago URI, acute   [facility], Timoteo Expose, MD             Passed - Patient is not pregnant      Passed - Last BP in normal range    BP Readings from Last 1 Encounters:  12/07/17 138/88

## 2018-11-21 NOTE — Progress Notes (Signed)
Subjective:    Patient ID: Evan Burns, male    DOB: 06-Jul-1981, 38 y.o.   MRN: [id number]  HPI The patient is here for follow up.  He is more tired.  He thinks gets about 3 hrs of sleep at night and 2 hrs in the morning. He is working two jobs.  He denies any other concerning symptoms.    Hypertension: He is taking his medication daily. He is compliant with a low sodium diet.  He denies chest pain, palpitations, edema, shortness of breath.  He has frequent headaches and he takes advil or tylenol and that takes care of them.  He is exercising regularly.      Asthma: he uses Symbicort once daily.  He takes the albuterol as needed, but doe snot take it often.  He denies cough, wheeze, sob.  He feels his asthma is controlled.   Hyperglycemia:  He is compliant with a low sugar/carbohydrate diet.  He is exercising regularly.  Dermatomyositis: He is not currently following with anyone. He denies muscle aches.     Medications and allergies reviewed with patient and updated if appropriate.  Patient Active Problem List   Diagnosis Date Noted  . Hyperglycemia 09/08/2017  . Essential hypertension 10/12/2010  . Dermatomyositis (HCC) 06/17/2008  . Asthma 02/22/2007    Current Outpatient Medications on File Prior to Visit  Medication Sig Dispense Refill  . albuterol (PROVENTIL HFA;VENTOLIN HFA) 108 (90 Base) MCG/ACT inhaler Inhale 2 puffs into the lungs every 6 (six) hours as needed for wheezing or shortness of breath. 18 g 1  . amLODipine (NORVASC) 5 MG tablet Take 1 by mouth daily. Need office visit for more refills. 30 tablet 0  . budesonide-formoterol (SYMBICORT) 160-4.5 MCG/ACT inhaler Inhale 2 puffs into the lungs 2 (two) times daily. 1 Inhaler 2  . cetaphil (CETAPHIL) lotion Apply 1 application topically as needed for dry skin. 236 mL 6  . COD LIVER OIL PO Take 1 capsule by mouth daily.    Marland Kitchen losartan (COZAAR) 100 MG tablet take 1 tablet by mouth once daily. 30 tablet 0  .  Omega-3 Fatty Acids (FISH OIL) 1000 MG CAPS Take 1,000 mg by mouth daily.      No current facility-administered medications on file prior to visit.     Past Medical History:  Diagnosis Date  . Asthma   . Dermatomyositis (HCC)   . Hypertension     Past Surgical History:  Procedure Laterality Date  . HAND SURGERY      Social History   Socioeconomic History  . Marital status: Married    Spouse name: Not on file  . Number of children: Not on file  . Years of education: Not on file  . Highest education level: Not on file  Occupational History  . Not on file  Social Needs  . Financial resource strain: Not on file  . Food insecurity:    Worry: Not on file    Inability: Not on file  . Transportation needs:    Medical: Not on file    Non-medical: Not on file  Tobacco Use  . Smoking status: Never Smoker  . Smokeless tobacco: Never Used  Substance and Sexual Activity  . Alcohol use: No  . Drug use: No  . Sexual activity: Not on file  Lifestyle  . Physical activity:    Days per week: Not on file    Minutes per session: Not on file  . Stress: Not on file  Relationships  . Social connections:    Talks on phone: Not on file    Gets together: Not on file    Attends religious service: Not on file    Active member of club or organization: Not on file    Attends meetings of clubs or organizations: Not on file    Relationship status: Not on file  Other Topics Concern  . Not on file  Social History Narrative   Pt lives at home w/ parents       Very active at work - works two jobs    Family History  Problem Relation Age of Onset  . Hypertension Mother   . Diabetes Mother   . Kidney disease Mother   . Hyperlipidemia Mother   . Hypertension Father   . Hyperlipidemia Father     Review of Systems  Constitutional: Positive for fatigue. Negative for chills and fever.  Respiratory: Negative for cough, shortness of breath and wheezing.   Cardiovascular: Negative for  chest pain, palpitations and leg swelling.  Neurological: Positive for headaches (occasional). Negative for light-headedness.       Objective:   Vitals:   11/22/18 0739  BP: 132/80  Pulse: 85  Resp: 16  Temp: 98.2 F (36.8 C)  SpO2: 99%   BP Readings from Last 3 Encounters:  11/22/18 132/80  12/07/17 138/88  09/08/17 130/84   Wt Readings from Last 3 Encounters:  11/22/18 236 lb (107 kg)  12/07/17 227 lb (103 kg)  09/08/17 225 lb (102.1 kg)   Body mass index is 36.96 kg/m.   Physical Exam    Constitutional: Appears well-developed and well-nourished. No distress.  HENT:  Head: Normocephalic and atraumatic.  Neck: Neck supple. No tracheal deviation present. No thyromegaly present.  No cervical lymphadenopathy Cardiovascular: Normal rate, regular rhythm and normal heart sounds.   No murmur heard. No carotid bruit .  No edema Pulmonary/Chest: Effort normal and breath sounds normal. No respiratory distress. No has no wheezes. No rales.  Skin: Skin is warm and dry. Not diaphoretic. Chronic skin changes neck area from dermatomyositis Psychiatric: Normal mood and affect. Behavior is normal.      Assessment & Plan:    See Problem List for Assessment and Plan of chronic medical problems.

## 2018-11-21 NOTE — Telephone Encounter (Signed)
Pt has scheduled appt on 11/22/2018. Pt aware he needs ov for future refills on meds.

## 2018-11-21 NOTE — Patient Instructions (Addendum)
  Tests ordered today. Your results will be released to MyChart (or called to you) after review, usually within 72hours after test completion. If any changes need to be made, you will be notified at that same time.  Medications reviewed and updated.  Changes include :   none  Your prescription(s) have been submitted to your pharmacy. Please take as directed and contact our office if you believe you are having problem(s) with the medication(s).   Please followup in one year   

## 2018-11-22 ENCOUNTER — Encounter: Payer: Self-pay | Admitting: Internal Medicine

## 2018-11-22 ENCOUNTER — Other Ambulatory Visit (INDEPENDENT_AMBULATORY_CARE_PROVIDER_SITE_OTHER): Payer: Managed Care, Other (non HMO)

## 2018-11-22 ENCOUNTER — Ambulatory Visit (INDEPENDENT_AMBULATORY_CARE_PROVIDER_SITE_OTHER): Payer: Managed Care, Other (non HMO) | Admitting: Internal Medicine

## 2018-11-22 VITALS — BP 132/80 | HR 85 | Temp 98.2°F | Resp 16 | Ht 67.0 in | Wt 236.0 lb

## 2018-11-22 DIAGNOSIS — M3390 Dermatopolymyositis, unspecified, organ involvement unspecified: Secondary | ICD-10-CM

## 2018-11-22 DIAGNOSIS — J4520 Mild intermittent asthma, uncomplicated: Secondary | ICD-10-CM | POA: Diagnosis not present

## 2018-11-22 DIAGNOSIS — R739 Hyperglycemia, unspecified: Secondary | ICD-10-CM | POA: Diagnosis not present

## 2018-11-22 DIAGNOSIS — I10 Essential (primary) hypertension: Secondary | ICD-10-CM | POA: Diagnosis not present

## 2018-11-22 DIAGNOSIS — R5383 Other fatigue: Secondary | ICD-10-CM | POA: Diagnosis not present

## 2018-11-22 LAB — COMPREHENSIVE METABOLIC PANEL
ALT: 30 U/L (ref 0–53)
AST: 24 U/L (ref 0–37)
Albumin: 4.2 g/dL (ref 3.5–5.2)
Alkaline Phosphatase: 43 U/L (ref 39–117)
BUN: 21 mg/dL (ref 6–23)
CO2: 27 mEq/L (ref 19–32)
Calcium: 9.3 mg/dL (ref 8.4–10.5)
Chloride: 106 mEq/L (ref 96–112)
Creatinine, Ser: 1.12 mg/dL (ref 0.40–1.50)
GFR: 89.36 mL/min (ref >60.00)
Glucose, Bld: 89 mg/dL (ref 70–99)
Potassium: 4.2 mEq/L (ref 3.5–5.1)
Sodium: 143 mEq/L (ref 135–145)
Total Bilirubin: 0.4 mg/dL (ref 0.2–1.2)
Total Protein: 6.5 g/dL (ref 6.0–8.3)

## 2018-11-22 LAB — CBC WITH DIFFERENTIAL/PLATELET
BASOS PCT: 1.2 % (ref 0.0–3.0)
Basophils Absolute: 0.1 10*3/uL (ref 0.0–0.1)
Eosinophils Absolute: 0.2 10*3/uL (ref 0.0–0.7)
Eosinophils Relative: 4.4 % (ref 0.0–5.0)
HCT: 43.5 % (ref 39.0–52.0)
Hemoglobin: 14.1 g/dL (ref 13.0–17.0)
Lymphocytes Relative: 30.7 % (ref 12.0–46.0)
Lymphs Abs: 1.7 10*3/uL (ref 0.7–4.0)
MCHC: 32.5 g/dL (ref 30.0–36.0)
MCV: 82.7 fl (ref 78.0–100.0)
MONO ABS: 0.4 10*3/uL (ref 0.1–1.0)
Monocytes Relative: 7.5 % (ref 3.0–12.0)
Neutro Abs: 3 10*3/uL (ref 1.4–7.7)
Neutrophils Relative %: 56.2 % (ref 43.0–77.0)
Platelets: 238 10*3/uL (ref 150.0–400.0)
RBC: 5.26 Mil/uL (ref 4.22–5.81)
RDW: 12.8 % (ref 11.5–15.5)
WBC: 5.4 10*3/uL (ref 4.0–10.5)

## 2018-11-22 LAB — LIPID PANEL
Cholesterol: 116 mg/dL (ref 0–200)
HDL: 41.4 mg/dL (ref >39.00)
LDL Cholesterol: 55 mg/dL (ref 0–99)
NonHDL: 74.85
Total CHOL/HDL Ratio: 3
Triglycerides: 100 mg/dL (ref 0.0–149.0)
VLDL: 20 mg/dL (ref 0.0–40.0)

## 2018-11-22 LAB — TSH: TSH: 0.97 u[IU]/mL (ref 0.35–4.50)

## 2018-11-22 LAB — HEMOGLOBIN A1C: Hgb A1c MFr Bld: 5.6 % (ref 4.6–6.5)

## 2018-11-22 MED ORDER — BUDESONIDE-FORMOTEROL FUMARATE 160-4.5 MCG/ACT IN AERO: 2.0000 | INHALATION_SPRAY | Freq: Two times a day (BID) | RESPIRATORY_TRACT | 2 refills | Status: DC

## 2018-11-22 MED ORDER — LOSARTAN POTASSIUM 100 MG PO TABS: ORAL_TABLET | ORAL | 1 refills | Status: DC

## 2018-11-22 MED ORDER — AMLODIPINE BESYLATE 5 MG PO TABS: ORAL_TABLET | ORAL | 1 refills | Status: DC

## 2018-11-22 MED ORDER — ALBUTEROL SULFATE HFA 108 (90 BASE) MCG/ACT IN AERS: 2.0000 | INHALATION_SPRAY | Freq: Four times a day (QID) | RESPIRATORY_TRACT | 1 refills | Status: DC | PRN

## 2018-11-22 NOTE — Assessment & Plan Note (Signed)
Controlled Mild, persistent Continue Symbicort once daily and albuterol prn

## 2018-11-22 NOTE — Assessment & Plan Note (Addendum)
BP well controlled Current regimen effective and well tolerated Continue current medications at current doses cmp  

## 2018-11-22 NOTE — Assessment & Plan Note (Addendum)
Stable, no change No increase muscle pain or skin changes

## 2018-11-22 NOTE — Assessment & Plan Note (Signed)
Likely related to not sleeping enough - getting 5 hrs total but it is broken up Advised trying to increase amount of sleep Will check cbc, tsh, cmp

## 2018-11-22 NOTE — Assessment & Plan Note (Signed)
Check a1c Low sugar / carb diet Stressed regular exercise   

## 2019-02-13 ENCOUNTER — Telehealth: Payer: Self-pay | Admitting: Internal Medicine

## 2019-02-13 ENCOUNTER — Ambulatory Visit
Admission: EM | Admit: 2019-02-13 | Discharge: 2019-02-13 | Disposition: A | Discharge status: Home / Self Care | Payer: Managed Care, Other (non HMO)

## 2019-02-13 ENCOUNTER — Other Ambulatory Visit: Payer: Self-pay

## 2019-02-13 DIAGNOSIS — L989 Disorder of the skin and subcutaneous tissue, unspecified: Secondary | ICD-10-CM | POA: Diagnosis not present

## 2019-02-13 NOTE — Telephone Encounter (Signed)
Copied from CRM [id number]. Topic: General - Other >> Feb 13, 2019 11:56 AM Dalphine Handing A wrote: Reason for CRM: Patient was seen at urgent care for lesion. Patient stated that the urgent care doctor told him that he needed to be seen by his PCP. Patient wanting to make an appointment and would like a callback.

## 2019-02-13 NOTE — Telephone Encounter (Signed)
Would you like patient to be seen in the office or do this as virtual?

## 2019-02-13 NOTE — Telephone Encounter (Signed)
Appointment set for tomorrow.

## 2019-02-13 NOTE — ED Provider Notes (Signed)
[name]    CSN: [id number] Arrival date & time: 02/13/19  1006     History   Chief Complaint Chief Complaint  Patient presents with  . knot under skin    HPI Evan Burns is a 38 y.o. male.   38 year old male with history of asthma, dermatomyositis, HTN comes in for knot to the right upper abdomen. States he noticed it about 1 week ago, as stretching his arm a certain way caused some tenderness. He feels that it has become longer when he felt it yesterday. States some tenderness with arm stretching, and if laying on chest. Otherwise, no pain. No erythema, warmth. No fever, chills, night sweats. No itching.      Past Medical History:  Diagnosis Date  . Asthma   . Dermatomyositis (HCC)   . Hypertension     Patient Active Problem List   Diagnosis Date Noted  . Fatigue 11/22/2018  . Hyperglycemia 09/08/2017  . Essential hypertension 10/12/2010  . Dermatomyositis (HCC) 06/17/2008  . Asthma 02/22/2007    Past Surgical History:  Procedure Laterality Date  . HAND SURGERY         Home Medications    Prior to Admission medications   Medication Sig Start Date End Date Taking? Authorizing Provider  albuterol (PROVENTIL HFA;VENTOLIN HFA) 108 (90 Base) MCG/ACT inhaler Inhale 2 puffs into the lungs every 6 (six) hours as needed for wheezing or shortness of breath. 11/22/18   Burns, Bobette Mo, MD  amLODipine (NORVASC) 5 MG tablet Take 1 by mouth daily. Need office visit for more refills. 11/22/18   [name], MD  budesonide-formoterol (SYMBICORT) 160-4.5 MCG/ACT inhaler Inhale 2 puffs into the lungs 2 (two) times daily. 11/22/18   [name], MD  cetaphil (CETAPHIL) lotion Apply 1 application topically as needed for dry skin. 02/04/17   [place], Betty G, MD  COD LIVER OIL PO Take 1 capsule by mouth daily.    [provider]  losartan (COZAAR) 100 MG tablet take 1 tablet by mouth once daily. 11/22/18   [name], MD  Omega-3 Fatty Acids  (FISH OIL) 1000 MG CAPS Take 1,000 mg by mouth daily.     [provider]    Family History Family History  Problem Relation Age of Onset  . Hypertension Mother   . Diabetes Mother   . Kidney disease Mother   . Hyperlipidemia Mother   . Hypertension Father   . Hyperlipidemia Father     Social History Social History   Tobacco Use  . Smoking status: Never Smoker  . Smokeless tobacco: Never Used  Substance Use Topics  . Alcohol use: No  . Drug use: No     Allergies   Sulfonamide derivatives; Cephalexin; and Lisinopril   Review of Systems Review of Systems  Reason unable to perform ROS: See HPI as above.     Physical Exam Triage Vital Signs ED Triage Vitals  Enc Vitals Group     BP 02/13/19 1026 (!) 151/94     Pulse Rate 02/13/19 1026 63     Resp 02/13/19 1026 18     Temp 02/13/19 1026 98.1 F (36.7 C)     Temp Source 02/13/19 1026 Oral     SpO2 02/13/19 1026 98 %     Weight --      Height --      Head Circumference --      Peak Flow --  Pain Score 02/13/19 1027 0     Pain Loc --      Pain Edu? --      Excl. in GC? --    No data found.  Updated Vital Signs BP (!) 151/94 (BP Location: Left Arm)   Pulse 63   Temp 98.1 F (36.7 C) (Oral)   Resp 18   SpO2 98%   Physical Exam Constitutional:      General: He is not in acute distress.    Appearance: He is well-developed. He is not diaphoretic.  HENT:     Head: Normocephalic and atraumatic.  Eyes:     Conjunctiva/sclera: Conjunctivae normal.     Pupils: Pupils are equal, round, and reactive to light.  Abdominal:       Comments: About 3cm in length, 1cm in width skin lesion palpated on RUQ as shown above. Lesion not noticeable with visualization. No obvious tenderness to palpation. No erythema, warmth. No fluctuance.  +BS, no tenderness to palpation of abdomen. No hernia on exam.   Neurological:     Mental Status: He is alert and oriented to person, place, and time.      UC  Treatments / Results  Labs (all labs ordered are listed, but only abnormal results are displayed) Labs Reviewed - No data to display  EKG None  Radiology No results found.  Procedures Procedures (including critical care time)  Medications Ordered in UC Medications - No data to display  Initial Impression / Assessment and Plan / UC Course  I have reviewed the triage vital signs and the nursing notes.  Pertinent labs & imaging results that were available during my care of the patient were reviewed by me and considered in my medical decision making (see chart for details).    No signs on infection. Unsure etiology of skin lesion. Given history of dermatomyositis and established with dermatology, will have patient follow up for further evaluation needed. Return precautions given. Patient expresses understanding and agrees to plan.  Final Clinical Impressions(s) / UC Diagnoses   Final diagnoses:  Skin lesion   ED Prescriptions    None        Belinda FisherYu, Mitsuko Luera V, PA-C 02/13/19 1242

## 2019-02-13 NOTE — Discharge Instructions (Signed)
No signs of infection. Please follow up with PCP/dermatologist for further evaluation needed. If increasing size significantly with worsening pain, fever, nausea, vomiting, go to the ED for further evaluation and management needed.

## 2019-02-13 NOTE — Telephone Encounter (Signed)
Needs to be in office  - I do not think I will be able to see the lesion virtually.    This afternoon or tomorrow.

## 2019-02-13 NOTE — ED Triage Notes (Signed)
Pt c/o a knot under skin to rt upper abdomen and has became longer yesterday. Pt has hx of calcium deposit knots to hands and arms.

## 2019-02-14 ENCOUNTER — Ambulatory Visit
Admission: RE | Admit: 2019-02-14 | Discharge: 2019-02-14 | Disposition: A | Discharge status: Home / Self Care | Payer: Managed Care, Other (non HMO) | Source: Ambulatory Visit | Attending: Internal Medicine | Admitting: Internal Medicine

## 2019-02-14 ENCOUNTER — Ambulatory Visit: Payer: Managed Care, Other (non HMO) | Admitting: Internal Medicine

## 2019-02-14 ENCOUNTER — Other Ambulatory Visit: Payer: Self-pay

## 2019-02-14 ENCOUNTER — Encounter: Payer: Self-pay | Admitting: Internal Medicine

## 2019-02-14 ENCOUNTER — Other Ambulatory Visit: Payer: Self-pay | Admitting: Internal Medicine

## 2019-02-14 VITALS — BP 146/86 | HR 80 | Temp 99.0°F | Ht 67.0 in | Wt 236.0 lb

## 2019-02-14 DIAGNOSIS — I809 Phlebitis and thrombophlebitis of unspecified site: Secondary | ICD-10-CM | POA: Insufficient documentation

## 2019-02-14 DIAGNOSIS — R222 Localized swelling, mass and lump, trunk: Secondary | ICD-10-CM

## 2019-02-14 DIAGNOSIS — R0789 Other chest pain: Secondary | ICD-10-CM | POA: Insufficient documentation

## 2019-02-14 DIAGNOSIS — I10 Essential (primary) hypertension: Secondary | ICD-10-CM

## 2019-02-14 MED ORDER — NAPROXEN 500 MG PO TABS: 500.0000 mg | ORAL_TABLET | Freq: Two times a day (BID) | ORAL | 0 refills | Status: DC

## 2019-02-14 MED ORDER — IOPAMIDOL (ISOVUE-300) INJECTION 61%
75.0000 mL | Freq: Once | INTRAVENOUS | Status: AC | PRN
  Administered 2019-02-14: 12:00:00 75 mL via INTRAVENOUS

## 2019-02-14 MED ORDER — LOSARTAN POTASSIUM 100 MG PO TABS: ORAL_TABLET | ORAL | 1 refills | Status: DC

## 2019-02-14 MED ORDER — AMLODIPINE BESYLATE 10 MG PO TABS: 10.0000 mg | ORAL_TABLET | Freq: Every day | ORAL | 1 refills | Status: DC

## 2019-02-14 NOTE — Assessment & Plan Note (Signed)
Not controlled Increase amlodipine to 10 mg daily Continue losartan Monitor BP at home - goal < 130/80

## 2019-02-14 NOTE — Assessment & Plan Note (Signed)
Superficial chest discomfort from mass - see above

## 2019-02-14 NOTE — Progress Notes (Signed)
Subjective:    Patient ID: Evan Burns, male    DOB: September 06, 1981, 39 y.o.   MRN: [id number]  HPI The patient is here for an acute visit.  He went to urgent care yesterday for a knot under his skin in his right upper abdomen.  He noticed it one week ago.  He has some tenderness if he stretches his arm in a certain way.  It has become larger - it now extends to his upper abdomen. He denies redness, warmth.  It does not itch.  He denies fever/chills.     Hypertension: He is taking his medication daily. He is compliant with a low sodium diet.  He denies chest pain, palpitations, edema, shortness of breath and regular headaches.  He does not monitor his blood pressure at home.  He does exercise regularly.    His Dad did have a blood clot in his leg in the past year.     Medications and allergies reviewed with patient and updated if appropriate.  Patient Active Problem List   Diagnosis Date Noted  . Fatigue 11/22/2018  . Hyperglycemia 09/08/2017  . Essential hypertension 10/12/2010  . Dermatomyositis (HCC) 06/17/2008  . Asthma 02/22/2007    Current Outpatient Medications on File Prior to Visit  Medication Sig Dispense Refill  . albuterol (PROVENTIL HFA;VENTOLIN HFA) 108 (90 Base) MCG/ACT inhaler Inhale 2 puffs into the lungs every 6 (six) hours as needed for wheezing or shortness of breath. 18 g 1  . amLODipine (NORVASC) 5 MG tablet Take 1 by mouth daily. Need office visit for more refills. 90 tablet 1  . budesonide-formoterol (SYMBICORT) 160-4.5 MCG/ACT inhaler Inhale 2 puffs into the lungs 2 (two) times daily. 1 Inhaler 2  . cetaphil (CETAPHIL) lotion Apply 1 application topically as needed for dry skin. 236 mL 6  . COD LIVER OIL PO Take 1 capsule by mouth daily.    Marland Kitchen losartan (COZAAR) 100 MG tablet take 1 tablet by mouth once daily. 90 tablet 1  . Omega-3 Fatty Acids (FISH OIL) 1000 MG CAPS Take 1,000 mg by mouth daily.      No current facility-administered medications on file  prior to visit.     Past Medical History:  Diagnosis Date  . Asthma   . Dermatomyositis (HCC)   . Hypertension     Past Surgical History:  Procedure Laterality Date  . HAND SURGERY      Social History   Socioeconomic History  . Marital status: Married    Spouse name: Not on file  . Number of children: Not on file  . Years of education: Not on file  . Highest education level: Not on file  Occupational History  . Not on file  Social Needs  . Financial resource strain: Not on file  . Food insecurity:    Worry: Not on file    Inability: Not on file  . Transportation needs:    Medical: Not on file    Non-medical: Not on file  Tobacco Use  . Smoking status: Never Smoker  . Smokeless tobacco: Never Used  Substance and Sexual Activity  . Alcohol use: No  . Drug use: No  . Sexual activity: Not on file  Lifestyle  . Physical activity:    Days per week: Not on file    Minutes per session: Not on file  . Stress: Not on file  Relationships  . Social connections:    Talks on phone: Not on file  Gets together: Not on file    Attends religious service: Not on file    Active member of club or organization: Not on file    Attends meetings of clubs or organizations: Not on file    Relationship status: Not on file  Other Topics Concern  . Not on file  Social History Narrative   Pt lives at home w/ parents       Very active at work - works two jobs    Family History  Problem Relation Age of Onset  . Hypertension Mother   . Diabetes Mother   . Kidney disease Mother   . Hyperlipidemia Mother   . Hypertension Father   . Hyperlipidemia Father     Review of Systems  Constitutional: Negative for chills and fever.  Respiratory: Negative for cough, shortness of breath and wheezing.   Cardiovascular: Negative for chest pain, palpitations and leg swelling.  Gastrointestinal: Negative for abdominal pain.  Neurological: Negative for headaches.       Objective:    Vitals:   02/14/19 0806  BP: (!) 146/86  Pulse: 80  Temp: 99 F (37.2 C)  SpO2: 97%   BP Readings from Last 3 Encounters:  02/14/19 (!) 146/86  02/13/19 (!) 151/94  11/22/18 132/80   Wt Readings from Last 3 Encounters:  02/14/19 236 lb (107 kg)  11/22/18 236 lb (107 kg)  12/07/17 227 lb (103 kg)   Body mass index is 36.96 kg/m.   Physical Exam Constitutional:      Appearance: Normal appearance.  HENT:     Head: Normocephalic and atraumatic.  Cardiovascular:     Rate and Rhythm: Normal rate and regular rhythm.  Pulmonary:     Effort: Pulmonary effort is normal. No respiratory distress.     Breath sounds: Normal breath sounds. No wheezing or rales.  Skin:    General: Skin is warm and dry.     Findings: No erythema.     Comments: Right lower chest starting under breast and extending to upper abd  Neurological:     Mental Status: He is alert.            Assessment & Plan:    See Problem List for Assessment and Plan of chronic medical problems.

## 2019-02-14 NOTE — Patient Instructions (Signed)
Your BP should be less than 130/80 - monitor it at home   Your amlodipine was increased to 10 mg daily.  Continue the losartan.    Both were sent to your pharmacy.   Go to [address] - [facility] for the Ct of your Chest.

## 2019-02-14 NOTE — Assessment & Plan Note (Signed)
Cord like mass that extends from right lower chest - under breast to upper central abdomen - tender No obvious warmth or redness Possible superficial phlebitis - discussed with radiology - recommended CT of Chest with contrast - needs to be done today

## 2019-04-18 ENCOUNTER — Other Ambulatory Visit: Payer: Self-pay

## 2019-04-18 ENCOUNTER — Ambulatory Visit
Admission: EM | Admit: 2019-04-18 | Discharge: 2019-04-18 | Disposition: A | Discharge status: Home / Self Care | Payer: Managed Care, Other (non HMO) | Attending: Emergency Medicine | Admitting: Emergency Medicine

## 2019-04-18 DIAGNOSIS — W268XXA Contact with other sharp object(s), not elsewhere classified, initial encounter: Secondary | ICD-10-CM | POA: Diagnosis not present

## 2019-04-18 DIAGNOSIS — I10 Essential (primary) hypertension: Secondary | ICD-10-CM | POA: Diagnosis not present

## 2019-04-18 DIAGNOSIS — Z23 Encounter for immunization: Secondary | ICD-10-CM | POA: Diagnosis not present

## 2019-04-18 DIAGNOSIS — S61011A Laceration without foreign body of right thumb without damage to nail, initial encounter: Secondary | ICD-10-CM | POA: Diagnosis not present

## 2019-04-18 MED ORDER — TETANUS-DIPHTH-ACELL PERTUSSIS 5-2.5-18.5 LF-MCG/0.5 IM SUSP
0.5000 mL | Freq: Once | INTRAMUSCULAR | Status: AC
  Administered 2019-04-18: 0.5 mL via INTRAMUSCULAR

## 2019-04-18 NOTE — ED Triage Notes (Signed)
Pt cut his rt anterior thumb on a tuna can. Bandage applied

## 2019-04-18 NOTE — ED Provider Notes (Signed)
[name]    CSN: [id number] Arrival date & time: 04/18/19  1137      History   Chief Complaint Chief Complaint  Patient presents with  . Laceration    HPI Evan Burns is a 38 y.o. male.   Evan Burns presents with complaints of laceration to his right thumb after it was cut accidentally on a tuna can. This occurred immediately prior to arrival. Some burning pain, but no joint pain. No new numbness or tingling. Unknown last tetanus, per chart review was in 2014. States he was worried because it was persistently bleeding. Wears gloves at work. Without contributing medical history.      ROS per HPI, negative if not otherwise mentioned.      Past Medical History:  Diagnosis Date  . Asthma   . Dermatomyositis (HCC)   . Hypertension     Patient Active Problem List   Diagnosis Date Noted  . Chest mass 02/14/2019  . Other chest pain 02/14/2019  . Superficial phlebitis, chest wall 02/14/2019  . Fatigue 11/22/2018  . Hyperglycemia 09/08/2017  . Essential hypertension 10/12/2010  . Dermatomyositis (HCC) 06/17/2008  . Asthma 02/22/2007    Past Surgical History:  Procedure Laterality Date  . HAND SURGERY         Home Medications    Prior to Admission medications   Medication Sig Start Date End Date Taking? Authorizing Provider  albuterol (PROVENTIL HFA;VENTOLIN HFA) 108 (90 Base) MCG/ACT inhaler Inhale 2 puffs into the lungs every 6 (six) hours as needed for wheezing or shortness of breath. 11/22/18   Burns, Bobette MoStacy J, MD  amLODipine (NORVASC) 10 MG tablet Take 1 tablet (10 mg total) by mouth daily. 02/14/19   Pincus SanesBurns, Stacy J, MD  budesonide-formoterol (SYMBICORT) 160-4.5 MCG/ACT inhaler Inhale 2 puffs into the lungs 2 (two) times daily. 11/22/18   Pincus SanesBurns, Stacy J, MD  cetaphil (CETAPHIL) lotion Apply 1 application topically as needed for dry skin. 02/04/17   [place], Betty G, MD  COD LIVER OIL PO Take 1 capsule by mouth daily.    [provider]  losartan (COZAAR) 100 MG tablet take 1 tablet by mouth once daily. 02/14/19   Pincus SanesBurns, Stacy J, MD  naproxen (NAPROSYN) 500 MG tablet Take 1 tablet (500 mg total) by mouth 2 (two) times daily with a meal. 02/14/19   Burns, Bobette MoStacy J, MD  Omega-3 Fatty Acids (FISH OIL) 1000 MG CAPS Take 1,000 mg by mouth daily.     [provider]    Family History Family History  Problem Relation Age of Onset  . Hypertension Mother   . Diabetes Mother   . Kidney disease Mother   . Hyperlipidemia Mother   . Hypertension Father   . Hyperlipidemia Father     Social History Social History   Tobacco Use  . Smoking status: Never Smoker  . Smokeless tobacco: Never Used  Substance Use Topics  . Alcohol use: No  . Drug use: No     Allergies   Sulfonamide derivatives, Cephalexin, and Lisinopril   Review of Systems Review of Systems   Physical Exam Triage Vital Signs ED Triage Vitals  Enc Vitals Group     BP 04/18/19 1144 (!) 146/91     Pulse Rate 04/18/19 1144 70     Resp 04/18/19 1144 18     Temp 04/18/19 1144 98.1 F (36.7 C)     Temp Source 04/18/19 1144 Oral     SpO2 04/18/19 1144  98 %     Weight --      Height --      Head Circumference --      Peak Flow --      Pain Score 04/18/19 1145 2     Pain Loc --      Pain Edu? --      Excl. in GC? --    No data found.  Updated Vital Signs BP (!) 146/91 (BP Location: Left Arm)   Pulse 70   Temp 98.1 F (36.7 C) (Oral)   Resp 18   SpO2 98%   Physical Exam Constitutional:      Appearance: He is well-developed.  Cardiovascular:     Rate and Rhythm: Normal rate.  Pulmonary:     Effort: Pulmonary effort is normal.  Musculoskeletal:     Right hand: He exhibits laceration. He exhibits normal range of motion, no tenderness, no bony tenderness, normal two-point discrimination, normal capillary refill, no deformity and no swelling. Normal sensation noted. Normal strength noted.       Hands:     Comments: 0.5 cm  laceration to proximal phalanx of right thumb; superficial, less than 1mm in depth; well approximated; no active bleeding; no bony pain or joint pain or redness  Skin:    General: Skin is warm and dry.  Neurological:     Mental Status: He is alert and oriented to person, place, and time.      UC Treatments / Results  Labs (all labs ordered are listed, but only abnormal results are displayed) Labs Reviewed - No data to display  EKG None  Radiology No results found.  Procedures Laceration Repair  Date/Time: 04/18/2019 12:12 PM Performed by: Georgetta HaberBurky, Natalie B, NP Authorized by: Georgetta HaberBurky, Natalie B, NP   Consent:    Consent obtained:  Verbal   Consent given by:  Patient   Risks discussed:  Infection, pain and poor cosmetic result   Alternatives discussed:  No treatment and observation Anesthesia (see MAR for exact dosages):    Anesthesia method:  None Laceration details:    Location:  Finger   Finger location:  R thumb   Length (cm):  0.5   Depth (mm):  0.5 Repair type:    Repair type:  Simple Exploration:    Wound exploration: wound explored through full range of motion     Wound extent: no foreign bodies/material noted, no muscle damage noted, no underlying fracture noted and no vascular damage noted     Contaminated: no   Treatment:    Area cleansed with:  Soap and water and Betadine   Amount of cleaning:  Standard Skin repair:    Repair method:  Tissue adhesive Approximation:    Approximation:  Close Post-procedure details:    Dressing:  Adhesive bandage   Patient tolerance of procedure:  Tolerated well, no immediate complications   (including critical care time)  Medications Ordered in UC Medications  Tdap (BOOSTRIX) injection 0.5 mL (0.5 mLs Intramuscular Given 04/18/19 1152)    Initial Impression / Assessment and Plan / UC Course  I have reviewed the triage vital signs and the nursing notes.  Pertinent labs & imaging results that were available during my  care of the patient were reviewed by me and considered in my medical decision making (see chart for details).    Superficial laceration, but is over an area of movement and patient works with his hands and with gloves. Wound glue applied with close approximation. No  active bleeding. Wound care discussed. Return precautions provided.   Final Clinical Impressions(s) / UC Diagnoses   Final diagnoses:  Laceration of right thumb without foreign body without damage to nail, initial encounter     Discharge Instructions     Keep glue on as long as possible, it will eventually peel off.  Can be wet but avoid submerging in water.  Once glue off wash daily with soap and water to keep clean.  Over the next 1-2 days try to limit movement of the thumb to keep glue in place.  If develop redness, increased pain, drainage or otherwise concerned about infection please return to be seen.    ED Prescriptions    None     Controlled Substance Prescriptions [place] Controlled Substance Registry consulted? Not Applicable   Zigmund Gottron, NP 04/18/19 1214

## 2019-04-18 NOTE — Discharge Instructions (Signed)
Keep glue on as long as possible, it will eventually peel off.  Can be wet but avoid submerging in water.  Once glue off wash daily with soap and water to keep clean.  Over the next 1-2 days try to limit movement of the thumb to keep glue in place.  If develop redness, increased pain, drainage or otherwise concerned about infection please return to be seen.

## 2019-05-03 ENCOUNTER — Emergency Department (HOSPITAL_COMMUNITY)
Admission: EM | Admit: 2019-05-03 | Discharge: 2019-05-03 | Disposition: A | Discharge status: Home / Self Care | Payer: Managed Care, Other (non HMO) | Attending: Emergency Medicine | Admitting: Emergency Medicine

## 2019-05-03 ENCOUNTER — Other Ambulatory Visit: Payer: Self-pay

## 2019-05-03 ENCOUNTER — Encounter (HOSPITAL_COMMUNITY): Payer: Self-pay

## 2019-05-03 DIAGNOSIS — H6122 Impacted cerumen, left ear: Secondary | ICD-10-CM | POA: Diagnosis not present

## 2019-05-03 DIAGNOSIS — I10 Essential (primary) hypertension: Secondary | ICD-10-CM | POA: Diagnosis not present

## 2019-05-03 DIAGNOSIS — Z79899 Other long term (current) drug therapy: Secondary | ICD-10-CM | POA: Diagnosis not present

## 2019-05-03 DIAGNOSIS — J45909 Unspecified asthma, uncomplicated: Secondary | ICD-10-CM | POA: Diagnosis not present

## 2019-05-03 DIAGNOSIS — H9192 Unspecified hearing loss, left ear: Secondary | ICD-10-CM | POA: Diagnosis present

## 2019-05-03 NOTE — ED Provider Notes (Signed)
[facility]-EMERGENCY DEPT Provider Note   CSN: [id number] Arrival date & time: 05/03/19  0042    History   Chief Complaint Chief Complaint  Patient presents with  . Hearing Problem    HPI Evan Burns is a 38 y.o. male.     38 year old male presents to the emergency department for decreased hearing to his left ear which began yesterday morning.  Symptoms have been constant, unchanged.  He has no ear pain, tinnitus, drainage from the ear, fevers, congestion, rhinorrhea, history of trauma.  No medications taken prior to arrival for symptoms.  The history is provided by the patient. No language interpreter was used.    Past Medical History:  Diagnosis Date  . Asthma   . Dermatomyositis (HCC)   . Hypertension     Patient Active Problem List   Diagnosis Date Noted  . Chest mass 02/14/2019  . Other chest pain 02/14/2019  . Superficial phlebitis, chest wall 02/14/2019  . Fatigue 11/22/2018  . Hyperglycemia 09/08/2017  . Essential hypertension 10/12/2010  . Dermatomyositis (HCC) 06/17/2008  . Asthma 02/22/2007    Past Surgical History:  Procedure Laterality Date  . HAND SURGERY          Home Medications    Prior to Admission medications   Medication Sig Start Date End Date Taking? Authorizing Provider  albuterol (PROVENTIL HFA;VENTOLIN HFA) 108 (90 Base) MCG/ACT inhaler Inhale 2 puffs into the lungs every 6 (six) hours as needed for wheezing or shortness of breath. 11/22/18   Burns, Bobette MoStacy J, MD  amLODipine (NORVASC) 10 MG tablet Take 1 tablet (10 mg total) by mouth daily. 02/14/19   Pincus SanesBurns, Stacy J, MD  budesonide-formoterol (SYMBICORT) 160-4.5 MCG/ACT inhaler Inhale 2 puffs into the lungs 2 (two) times daily. 11/22/18   Pincus SanesBurns, Stacy J, MD  cetaphil (CETAPHIL) lotion Apply 1 application topically as needed for dry skin. 02/04/17   [place]Jordan, Betty G, MD  COD LIVER OIL PO Take 1 capsule by mouth daily.    [provider]  losartan (COZAAR)  100 MG tablet take 1 tablet by mouth once daily. 02/14/19   Pincus SanesBurns, Stacy J, MD  naproxen (NAPROSYN) 500 MG tablet Take 1 tablet (500 mg total) by mouth 2 (two) times daily with a meal. 02/14/19   Burns, Bobette MoStacy J, MD  Omega-3 Fatty Acids (FISH OIL) 1000 MG CAPS Take 1,000 mg by mouth daily.     [provider]    Family History Family History  Problem Relation Age of Onset  . Hypertension Mother   . Diabetes Mother   . Kidney disease Mother   . Hyperlipidemia Mother   . Hypertension Father   . Hyperlipidemia Father     Social History Social History   Tobacco Use  . Smoking status: Never Smoker  . Smokeless tobacco: Never Used  Substance Use Topics  . Alcohol use: No  . Drug use: No     Allergies   Sulfonamide derivatives, Cephalexin, and Lisinopril   Review of Systems Review of Systems Ten systems reviewed and are negative for acute change, except as noted in the HPI.    Physical Exam Updated Vital Signs BP (!) 156/90 (BP Location: Left Arm)   Pulse 83   Temp 98.2 F (36.8 C) (Oral)   Resp 16   Ht 5\' 7"  (1.702 m)   Wt 104.3 kg   SpO2 100%   BMI 36.02 kg/m   Physical Exam Vitals signs and nursing note reviewed.  Constitutional:  General: He is not in acute distress.    Appearance: He is well-developed. He is not diaphoretic.  HENT:     Head: Normocephalic and atraumatic.     Right Ear: Tympanic membrane, ear canal and external ear normal.     Ears:     Comments: Ear wax in L ear canal obscuring TM Eyes:     General: No scleral icterus.    Conjunctiva/sclera: Conjunctivae normal.  Neck:     Musculoskeletal: Normal range of motion.  Pulmonary:     Effort: Pulmonary effort is normal. No respiratory distress.  Musculoskeletal: Normal range of motion.  Skin:    General: Skin is warm and dry.     Coloration: Skin is not pale.     Findings: No erythema or rash.  Neurological:     Mental Status: He is alert and oriented to person, place, and  time.  Psychiatric:        Behavior: Behavior normal.      ED Treatments / Results  Labs (all labs ordered are listed, but only abnormal results are displayed) Labs Reviewed - No data to display  EKG None  Radiology No results found.  Procedures .Foreign Body Removal  Date/Time: 05/03/2019 4:11 AM Performed by: Moreen Fowler, RN Authorized by: Antonietta Breach, PA-C  Consent: The procedure was performed in an emergent situation. Verbal consent obtained. Risks and benefits: risks, benefits and alternatives were discussed Consent given by: patient Patient understanding: patient states understanding of the procedure being performed Patient consent: the patient's understanding of the procedure matches consent given Procedure consent: procedure consent matches procedure scheduled Required items: required blood products, implants, devices, and special equipment available Patient identity confirmed: verbally with patient and arm band Time out: Immediately prior to procedure a "time out" was called to verify the correct patient, procedure, equipment, support staff and site/side marked as required. Body area: ear Location details: left ear Localization method: visualized Removal mechanism: irrigation Complexity: simple 1 objects recovered. Objects recovered: ear wax Post-procedure assessment: foreign body removed Patient tolerance: patient tolerated the procedure well with no immediate complications   (including critical care time)  Medications Ordered in ED Medications - No data to display   Initial Impression / Assessment and Plan / ED Course  I have reviewed the triage vital signs and the nursing notes.  Pertinent labs & imaging results that were available during my care of the patient were reviewed by me and considered in my medical decision making (see chart for details).        38 year old male presenting for decreased hearing secondary to impacted cerumen.   This was cleaned with ear irrigation. Hearing has improved following removal of ear wax. Patient to follow up with his PCP as needed. Return precautions discussed and provided. Patient discharged in stable condition with no unaddressed concerns.   Final Clinical Impressions(s) / ED Diagnoses   Final diagnoses:  Impacted cerumen of left ear    ED Discharge Orders    None       Antonietta Breach, PA-C 05/03/19 [id number]    Fatima Blank, MD 05/06/19 [phone]

## 2019-05-03 NOTE — ED Triage Notes (Signed)
Pt stating he is having difficulty hearing out of his left ear since yesterday morning. Denies any pain or ringing in the ears, stating it is just annoying.

## 2019-05-03 NOTE — ED Notes (Signed)
Bed: WTR8 Expected date:  Expected time:  Means of arrival:  Comments: 

## 2019-05-23 ENCOUNTER — Telehealth: Payer: Self-pay | Admitting: Internal Medicine

## 2019-05-23 DIAGNOSIS — Z20822 Contact with and (suspected) exposure to covid-19: Secondary | ICD-10-CM

## 2019-05-23 DIAGNOSIS — Z20828 Contact with and (suspected) exposure to other viral communicable diseases: Secondary | ICD-10-CM

## 2019-05-23 NOTE — Telephone Encounter (Signed)
Please advise 

## 2019-05-23 NOTE — Telephone Encounter (Signed)
Pt aware.

## 2019-05-23 NOTE — Telephone Encounter (Signed)
Test ordered.  He needs to quarantine himself until the results are back.

## 2019-05-23 NOTE — Telephone Encounter (Addendum)
Patient has been around some people who were tested positive for Covid-19 and he would like to be tested for it. He would like a call back when the order has been submitted.

## 2019-05-24 ENCOUNTER — Other Ambulatory Visit: Payer: Self-pay

## 2019-05-24 DIAGNOSIS — Z20822 Contact with and (suspected) exposure to covid-19: Secondary | ICD-10-CM

## 2019-05-27 LAB — NOVEL CORONAVIRUS, NAA: SARS-CoV-2, NAA: NOT DETECTED

## 2019-05-28 ENCOUNTER — Ambulatory Visit (INDEPENDENT_AMBULATORY_CARE_PROVIDER_SITE_OTHER): Payer: Managed Care, Other (non HMO) | Admitting: Otolaryngology

## 2019-07-12 ENCOUNTER — Encounter: Payer: Self-pay | Admitting: Orthopaedic Surgery

## 2019-07-12 ENCOUNTER — Ambulatory Visit (INDEPENDENT_AMBULATORY_CARE_PROVIDER_SITE_OTHER): Payer: Managed Care, Other (non HMO) | Admitting: Orthopaedic Surgery

## 2019-07-12 ENCOUNTER — Ambulatory Visit (INDEPENDENT_AMBULATORY_CARE_PROVIDER_SITE_OTHER): Payer: Managed Care, Other (non HMO)

## 2019-07-12 DIAGNOSIS — M3390 Dermatopolymyositis, unspecified, organ involvement unspecified: Secondary | ICD-10-CM

## 2019-07-12 DIAGNOSIS — R2232 Localized swelling, mass and lump, left upper limb: Secondary | ICD-10-CM

## 2019-07-12 NOTE — Progress Notes (Signed)
Office Visit Note   Patient: Evan Burns           Date of Birth: 08/16/1981           MRN: [id number] Visit Date: 07/12/2019              Requested by: Pincus SanesBurns, Stacy J, MD [address] [place],  [place] [id number] PCP: Pincus SanesBurns, Stacy J, MD   Assessment & Plan: Visit Diagnoses:  1. Nodule of skin of finger of left hand   2. Dermatomyositis (HCC)     Plan: Impression is symptomatic calcification PIP joint left long finger.  I reviewed the x-rays which do correlate with his symptomatic area of the left long finger PIP joint.  The skin is slightly erythematous likely due to the local irritation.  I do not get the impression that this is infected.  However I do think that this will be an ongoing issue and I recommended excision of the calcification to prevent erosion through the skin which would then significantly increased likelihood of deep infection.  Patient will think about this and let us know when he would like to have this removed. Total face to face encounter time was greater than 25 minutes and over half of this time was spent in counseling and/or coordination of care.  Follow-Up Instructions: Return if symptoms worsen or fail to improve, for post-op.   Orders:  Orders Placed This Encounter  Procedures   XR Finger Middle Left   No orders of the defined types were placed in this encounter.     Procedures: No procedures performed   Clinical Data: No additional findings.   Subjective: Chief Complaint  Patient presents with   Left Hand - Pain    HPI patient is a pleasant 38 year old gentleman with a history of dermatomyositis who comes in today with concerns about his left long finger.  He noticed pain and swelling to the dorsum of the PIP joint a few days ago.  The swelling has decreased but the pain is increased.  He has noticed mild erythema.  No fevers or chills.  No drainage to the wound.  He does have a history of having this issue in the past with excision by  Dr. Lajoyce Cornersuda.    Review of Systems as detailed in HPI.  All others reviewed and are negative.   Objective: Vital Signs: There were no vitals taken for this visit.  Physical Exam well-developed well-nourished gentleman in no acute distress.  Alert and oriented x3.  Ortho Exam examination of his left long finger reveals a nodule to the PIP joint with mild swelling and moderate tenderness.  No drainage.  Mild erythema.  No induration.  He is neurovascular intact distally.  Specialty Comments:  No specialty comments available.  Imaging: Xr Finger Middle Left  Result Date: 07/12/2019 X-rays demonstrate calcification along the PIP joint long finger    PMFS History: Patient Active Problem List   Diagnosis Date Noted   Nodule of skin of finger of left hand 07/12/2019   Chest mass 02/14/2019   Other chest pain 02/14/2019   Superficial phlebitis, chest wall 02/14/2019   Fatigue 11/22/2018   Hyperglycemia 09/08/2017   Essential hypertension 10/12/2010   Dermatomyositis (HCC) 06/17/2008   Asthma 02/22/2007   Past Medical History:  Diagnosis Date   Asthma    Dermatomyositis (HCC)    Hypertension     Family History  Problem Relation Age of Onset   Hypertension Mother  Diabetes Mother    Kidney disease Mother    Hyperlipidemia Mother    Hypertension Father    Hyperlipidemia Father     Past Surgical History:  Procedure Laterality Date   HAND SURGERY     Social History   Occupational History   Not on file  Tobacco Use   Smoking status: Never Smoker   Smokeless tobacco: Never Used  Substance and Sexual Activity   Alcohol use: No   Drug use: No   Sexual activity: Not on file

## 2019-07-23 ENCOUNTER — Encounter (HOSPITAL_BASED_OUTPATIENT_CLINIC_OR_DEPARTMENT_OTHER): Payer: Self-pay | Admitting: *Deleted

## 2019-07-23 ENCOUNTER — Other Ambulatory Visit: Payer: Self-pay

## 2019-07-24 ENCOUNTER — Other Ambulatory Visit (HOSPITAL_COMMUNITY)
Admission: RE | Admit: 2019-07-24 | Discharge: 2019-07-24 | Disposition: A | Discharge status: Home / Self Care | Payer: Managed Care, Other (non HMO) | Source: Ambulatory Visit | Attending: Orthopaedic Surgery | Admitting: Orthopaedic Surgery

## 2019-07-24 ENCOUNTER — Other Ambulatory Visit: Payer: Self-pay

## 2019-07-24 DIAGNOSIS — Z20828 Contact with and (suspected) exposure to other viral communicable diseases: Secondary | ICD-10-CM | POA: Insufficient documentation

## 2019-07-24 DIAGNOSIS — R2232 Localized swelling, mass and lump, left upper limb: Secondary | ICD-10-CM | POA: Diagnosis not present

## 2019-07-24 DIAGNOSIS — Z01812 Encounter for preprocedural laboratory examination: Secondary | ICD-10-CM | POA: Diagnosis not present

## 2019-07-25 LAB — NOVEL CORONAVIRUS, NAA (HOSP ORDER, SEND-OUT TO REF LAB; TAT 18-24 HRS): SARS-CoV-2, NAA: NOT DETECTED

## 2019-07-26 ENCOUNTER — Encounter (HOSPITAL_BASED_OUTPATIENT_CLINIC_OR_DEPARTMENT_OTHER)
Admission: RE | Admit: 2019-07-26 | Discharge: 2019-07-26 | Disposition: A | Discharge status: Home / Self Care | Payer: Managed Care, Other (non HMO) | Source: Ambulatory Visit | Attending: Orthopaedic Surgery | Admitting: Orthopaedic Surgery

## 2019-07-26 ENCOUNTER — Other Ambulatory Visit: Payer: Self-pay

## 2019-07-26 DIAGNOSIS — Z79899 Other long term (current) drug therapy: Secondary | ICD-10-CM | POA: Diagnosis not present

## 2019-07-26 DIAGNOSIS — M3390 Dermatopolymyositis, unspecified, organ involvement unspecified: Secondary | ICD-10-CM | POA: Diagnosis not present

## 2019-07-26 DIAGNOSIS — J45909 Unspecified asthma, uncomplicated: Secondary | ICD-10-CM | POA: Diagnosis not present

## 2019-07-26 DIAGNOSIS — I10 Essential (primary) hypertension: Secondary | ICD-10-CM | POA: Diagnosis not present

## 2019-07-26 DIAGNOSIS — L905 Scar conditions and fibrosis of skin: Secondary | ICD-10-CM | POA: Diagnosis not present

## 2019-07-26 DIAGNOSIS — Z7951 Long term (current) use of inhaled steroids: Secondary | ICD-10-CM | POA: Diagnosis not present

## 2019-07-26 DIAGNOSIS — R2232 Localized swelling, mass and lump, left upper limb: Secondary | ICD-10-CM | POA: Diagnosis present

## 2019-07-26 NOTE — Progress Notes (Signed)

## 2019-07-27 ENCOUNTER — Ambulatory Visit (HOSPITAL_BASED_OUTPATIENT_CLINIC_OR_DEPARTMENT_OTHER): Payer: Managed Care, Other (non HMO) | Admitting: Anesthesiology

## 2019-07-27 ENCOUNTER — Encounter (HOSPITAL_BASED_OUTPATIENT_CLINIC_OR_DEPARTMENT_OTHER)
Admission: RE | Disposition: A | Discharge status: Home / Self Care | Payer: Self-pay | Source: Home / Self Care | Attending: Orthopaedic Surgery

## 2019-07-27 ENCOUNTER — Other Ambulatory Visit: Payer: Self-pay

## 2019-07-27 ENCOUNTER — Ambulatory Visit (HOSPITAL_BASED_OUTPATIENT_CLINIC_OR_DEPARTMENT_OTHER)
Admission: RE | Admit: 2019-07-27 | Discharge: 2019-07-27 | Disposition: A | Discharge status: Home / Self Care | Payer: Managed Care, Other (non HMO) | Attending: Orthopaedic Surgery | Admitting: Orthopaedic Surgery

## 2019-07-27 ENCOUNTER — Encounter (HOSPITAL_BASED_OUTPATIENT_CLINIC_OR_DEPARTMENT_OTHER): Payer: Self-pay | Admitting: Anesthesiology

## 2019-07-27 ENCOUNTER — Telehealth: Payer: Self-pay | Admitting: Orthopaedic Surgery

## 2019-07-27 DIAGNOSIS — R2232 Localized swelling, mass and lump, left upper limb: Secondary | ICD-10-CM

## 2019-07-27 DIAGNOSIS — L905 Scar conditions and fibrosis of skin: Secondary | ICD-10-CM | POA: Diagnosis not present

## 2019-07-27 DIAGNOSIS — J45909 Unspecified asthma, uncomplicated: Secondary | ICD-10-CM | POA: Insufficient documentation

## 2019-07-27 DIAGNOSIS — Z79899 Other long term (current) drug therapy: Secondary | ICD-10-CM | POA: Insufficient documentation

## 2019-07-27 DIAGNOSIS — Z7951 Long term (current) use of inhaled steroids: Secondary | ICD-10-CM | POA: Insufficient documentation

## 2019-07-27 DIAGNOSIS — I10 Essential (primary) hypertension: Secondary | ICD-10-CM | POA: Insufficient documentation

## 2019-07-27 DIAGNOSIS — M3390 Dermatopolymyositis, unspecified, organ involvement unspecified: Secondary | ICD-10-CM | POA: Insufficient documentation

## 2019-07-27 HISTORY — PX: MASS EXCISION: SHX2000

## 2019-07-27 SURGERY — EXCISION MASS
Anesthesia: Regional | Site: Hand | Laterality: Left

## 2019-07-27 MED ORDER — FENTANYL CITRATE (PF) 100 MCG/2ML IJ SOLN
INTRAMUSCULAR | Status: AC
  Filled 2019-07-27: qty 2

## 2019-07-27 MED ORDER — PROPOFOL 500 MG/50ML IV EMUL
INTRAVENOUS | Status: DC | PRN
  Administered 2019-07-27: 25 ug/kg/min via INTRAVENOUS

## 2019-07-27 MED ORDER — FENTANYL CITRATE (PF) 100 MCG/2ML IJ SOLN
INTRAMUSCULAR | Status: DC | PRN
  Administered 2019-07-27: 100 ug via INTRAVENOUS

## 2019-07-27 MED ORDER — BUPIVACAINE HCL (PF) 0.25 % IJ SOLN
INTRAMUSCULAR | Status: DC | PRN
  Administered 2019-07-27: 2 mL

## 2019-07-27 MED ORDER — LACTATED RINGERS IV SOLN
INTRAVENOUS | Status: DC
  Administered 2019-07-27: 11:00:00 via INTRAVENOUS

## 2019-07-27 MED ORDER — CHLORHEXIDINE GLUCONATE 4 % EX LIQD: 60.0000 mL | Freq: Once | CUTANEOUS | Status: DC

## 2019-07-27 MED ORDER — MIDAZOLAM HCL 2 MG/2ML IJ SOLN
INTRAMUSCULAR | Status: AC
  Filled 2019-07-27: qty 2

## 2019-07-27 MED ORDER — LIDOCAINE HCL (CARDIAC) PF 100 MG/5ML IV SOSY
PREFILLED_SYRINGE | INTRAVENOUS | Status: DC | PRN
  Administered 2019-07-27: 50 mg via INTRAVENOUS

## 2019-07-27 MED ORDER — FENTANYL CITRATE (PF) 100 MCG/2ML IJ SOLN: 50.0000 ug | INTRAMUSCULAR | Status: DC | PRN

## 2019-07-27 MED ORDER — LIDOCAINE HCL (PF) 1 % IJ SOLN
INTRAMUSCULAR | Status: AC
  Filled 2019-07-27: qty 30

## 2019-07-27 MED ORDER — BUPIVACAINE HCL (PF) 0.5 % IJ SOLN
INTRAMUSCULAR | Status: AC
  Filled 2019-07-27: qty 30

## 2019-07-27 MED ORDER — CEFAZOLIN SODIUM-DEXTROSE 2-4 GM/100ML-% IV SOLN
2.0000 g | INTRAVENOUS | Status: AC
  Administered 2019-07-27: 12:00:00 2 g via INTRAVENOUS

## 2019-07-27 MED ORDER — HYDROMORPHONE HCL 1 MG/ML IJ SOLN: 0.2500 mg | INTRAMUSCULAR | Status: DC | PRN

## 2019-07-27 MED ORDER — CEFAZOLIN SODIUM-DEXTROSE 2-4 GM/100ML-% IV SOLN
INTRAVENOUS | Status: AC
  Filled 2019-07-27: qty 100

## 2019-07-27 MED ORDER — HYDROCODONE-ACETAMINOPHEN 5-325 MG PO TABS: 1.0000 | ORAL_TABLET | Freq: Three times a day (TID) | ORAL | 0 refills | Status: DC | PRN

## 2019-07-27 MED ORDER — MIDAZOLAM HCL 2 MG/2ML IJ SOLN: 1.0000 mg | INTRAMUSCULAR | Status: DC | PRN

## 2019-07-27 MED ORDER — ONDANSETRON HCL 4 MG/2ML IJ SOLN
INTRAMUSCULAR | Status: DC | PRN
  Administered 2019-07-27: 4 mg via INTRAVENOUS

## 2019-07-27 MED ORDER — MIDAZOLAM HCL 5 MG/5ML IJ SOLN
INTRAMUSCULAR | Status: DC | PRN
  Administered 2019-07-27: 2 mg via INTRAVENOUS

## 2019-07-27 MED ORDER — 0.9 % SODIUM CHLORIDE (POUR BTL) OPTIME
TOPICAL | Status: DC | PRN
  Administered 2019-07-27: 12:00:00 120 mL

## 2019-07-27 MED ORDER — BUPIVACAINE HCL (PF) 0.25 % IJ SOLN
INTRAMUSCULAR | Status: AC
  Filled 2019-07-27: qty 30

## 2019-07-27 SURGICAL SUPPLY — 54 items
ADH SKN CLS APL DERMABOND .7 (GAUZE/BANDAGES/DRESSINGS) ×1
BAND INSRT 18 STRL LF DISP RB (MISCELLANEOUS) ×1
BAND RUBBER #18 3X1/16 STRL (MISCELLANEOUS) ×3 IMPLANT
BLADE ARTHRO LOK 4 BEAVER (BLADE) ×1 IMPLANT
BLADE ARTHRO LOK 4MM BEAVER (BLADE) ×1
BLADE SURG 15 STRL LF DISP TIS (BLADE) ×2 IMPLANT
BLADE SURG 15 STRL SS (BLADE) ×6
BNDG CMPR 9X4 STRL LF SNTH (GAUZE/BANDAGES/DRESSINGS)
BNDG COHESIVE 1X5 TAN STRL LF (GAUZE/BANDAGES/DRESSINGS) ×3 IMPLANT
BNDG CONFORM 2 STRL LF (GAUZE/BANDAGES/DRESSINGS) ×3 IMPLANT
BNDG ELASTIC 6X5.8 VLCR STR LF (GAUZE/BANDAGES/DRESSINGS) IMPLANT
BNDG ESMARK 4X9 LF (GAUZE/BANDAGES/DRESSINGS) ×1 IMPLANT
BRUSH SCRUB EZ PLAIN DRY (MISCELLANEOUS) ×3 IMPLANT
CORD BIPOLAR FORCEPS 12FT (ELECTRODE) ×3 IMPLANT
COVER BACK TABLE REUSABLE LG (DRAPES) ×3 IMPLANT
COVER MAYO STAND REUSABLE (DRAPES) ×3 IMPLANT
COVER WAND RF STERILE (DRAPES) IMPLANT
CUFF TOURN SGL QUICK 18X4 (TOURNIQUET CUFF) ×2 IMPLANT
DECANTER SPIKE VIAL GLASS SM (MISCELLANEOUS) IMPLANT
DERMABOND ADVANCED (GAUZE/BANDAGES/DRESSINGS) ×2
DERMABOND ADVANCED .7 DNX12 (GAUZE/BANDAGES/DRESSINGS) IMPLANT
DRAPE EXTREMITY T 121X128X90 (DISPOSABLE) ×3 IMPLANT
DRAPE IMP U-DRAPE 54X76 (DRAPES) ×3 IMPLANT
DRAPE SURG 17X23 STRL (DRAPES) ×3 IMPLANT
GAUZE 4X4 16PLY RFD (DISPOSABLE) ×2 IMPLANT
GAUZE SPONGE 4X4 12PLY STRL (GAUZE/BANDAGES/DRESSINGS) ×3 IMPLANT
GAUZE XEROFORM 1X8 LF (GAUZE/BANDAGES/DRESSINGS) ×3 IMPLANT
GLOVE BIOGEL PI IND STRL 7.0 (GLOVE) ×1 IMPLANT
GLOVE BIOGEL PI INDICATOR 7.0 (GLOVE) ×6
GLOVE ECLIPSE 7.0 STRL STRAW (GLOVE) ×3 IMPLANT
GLOVE SKINSENSE NS SZ7.5 (GLOVE) ×2
GLOVE SKINSENSE STRL SZ7.5 (GLOVE) ×1 IMPLANT
GLOVE SURG SYN 7.5  E (GLOVE) ×4
GLOVE SURG SYN 7.5 E (GLOVE) ×2 IMPLANT
GLOVE SURG SYN 7.5 PF PI (GLOVE) ×2 IMPLANT
GOWN STRL REIN XL XLG (GOWN DISPOSABLE) ×3 IMPLANT
GOWN STRL REUS W/ TWL LRG LVL3 (GOWN DISPOSABLE) ×1 IMPLANT
GOWN STRL REUS W/ TWL XL LVL3 (GOWN DISPOSABLE) ×2 IMPLANT
GOWN STRL REUS W/TWL LRG LVL3 (GOWN DISPOSABLE)
GOWN STRL REUS W/TWL XL LVL3 (GOWN DISPOSABLE) ×6
NDL HYPO 25X1 1.5 SAFETY (NEEDLE) IMPLANT
NEEDLE HYPO 25X1 1.5 SAFETY (NEEDLE) ×3 IMPLANT
NS IRRIG 1000ML POUR BTL (IV SOLUTION) ×2 IMPLANT
PACK BASIN DAY SURGERY FS (CUSTOM PROCEDURE TRAY) ×3 IMPLANT
STOCKINETTE 4X48 STRL (DRAPES) ×2 IMPLANT
SUT ETHILON 2 0 FS 18 (SUTURE) IMPLANT
SUT ETHILON 4 0 PS 2 18 (SUTURE) ×3 IMPLANT
SUT VIC AB 0 CT1 27 (SUTURE)
SUT VIC AB 0 CT1 27XBRD ANBCTR (SUTURE) ×1 IMPLANT
SUT VIC AB 2-0 CT1 27 (SUTURE)
SUT VIC AB 2-0 CT1 TAPERPNT 27 (SUTURE) IMPLANT
SYR BULB 3OZ (MISCELLANEOUS) ×2 IMPLANT
SYR CONTROL 10ML LL (SYRINGE) ×2 IMPLANT
TOWEL GREEN STERILE FF (TOWEL DISPOSABLE) ×3 IMPLANT

## 2019-07-27 NOTE — Transfer of Care (Signed)
Immediate Anesthesia Transfer of Care Note  Patient: Evan Burns  Procedure(s) Performed: LEFT LONG FINGER NODULE EXCISION (Left Hand)  Patient Location: PACU  Anesthesia Type:MAC  Level of Consciousness: awake and alert   Airway & Oxygen Therapy: Patient Spontanous Breathing and Patient connected to nasal cannula oxygen  Post-op Assessment: Report given to RN and Post -op Vital signs reviewed and stable  Post vital signs: Reviewed and stable  Last Vitals:  Vitals Value Taken Time  BP 114/72 07/27/19 1223  Temp    Pulse 63 07/27/19 1224  Resp 11 07/27/19 1224  SpO2 100 % 07/27/19 1224  Vitals shown include unvalidated device data.  Last Pain:  Vitals:   07/27/19 1038  TempSrc: Oral  PainSc: 0-No pain         Complications: No apparent anesthesia complications

## 2019-07-27 NOTE — H&P (Signed)
PREOPERATIVE H&P  Chief Complaint: left long finger PIP nodule/calcification  HPI: Evan Burns is a 38 y.o. male who presents for surgical treatment of left long finger PIP nodule/calcification.  He denies any changes in medical history.  Past Medical History:  Diagnosis Date  . Asthma   . Dermatomyositis ([place])   . Hypertension    Past Surgical History:  Procedure Laterality Date  . HAND SURGERY    . INCISION AND DRAINAGE OF WOUND    . TYMPANOSTOMY TUBE PLACEMENT     Social History   Socioeconomic History  . Marital status: Married    Spouse name: Not on file  . Number of children: Not on file  . Years of education: Not on file  . Highest education level: Not on file  Occupational History  . Not on file  Social Needs  . Financial resource strain: Not on file  . Food insecurity    Worry: Not on file    Inability: Not on file  . Transportation needs    Medical: Not on file    Non-medical: Not on file  Tobacco Use  . Smoking status: Never Smoker  . Smokeless tobacco: Never Used  Substance and Sexual Activity  . Alcohol use: No  . Drug use: No  . Sexual activity: Not on file  Lifestyle  . Physical activity    Days per week: Not on file    Minutes per session: Not on file  . Stress: Not on file  Relationships  . Social Herbalist on phone: Not on file    Gets together: Not on file    Attends religious service: Not on file    Active member of club or organization: Not on file    Attends meetings of clubs or organizations: Not on file    Relationship status: Not on file  Other Topics Concern  . Not on file  Social History Narrative   Pt lives at home w/ parents       Very active at work - works two jobs   Family History  Problem Relation Age of Onset  . Hypertension Mother   . Diabetes Mother   . Kidney disease Mother   . Hyperlipidemia Mother   . Hypertension Father   . Hyperlipidemia Father    Allergies  Allergen Reactions  .  Sulfonamide Derivatives     REACTION: SWELLING (in childhood)  . Cephalexin     REACTION: vomiting  No rash or fever  . Lisinopril     REACTION: cough   Prior to Admission medications   Medication Sig Start Date End Date Taking? Authorizing Provider  amLODipine (NORVASC) 10 MG tablet Take 1 tablet (10 mg total) by mouth daily. 02/14/19  Yes Burns, Claudina Lick, MD  budesonide-formoterol (SYMBICORT) 160-4.5 MCG/ACT inhaler Inhale 2 puffs into the lungs 2 (two) times daily. 11/22/18  Yes Burns, Claudina Lick, MD  COD LIVER OIL PO Take 1 capsule by mouth daily.   Yes [provider]  losartan (COZAAR) 100 MG tablet take 1 tablet by mouth once daily. 02/14/19  Yes Burns, Claudina Lick, MD  Omega-3 Fatty Acids (FISH OIL) 1000 MG CAPS Take 1,000 mg by mouth daily.    Yes [provider]  albuterol (PROVENTIL HFA;VENTOLIN HFA) 108 (90 Base) MCG/ACT inhaler Inhale 2 puffs into the lungs every 6 (six) hours as needed for wheezing or shortness of breath. 11/22/18   Quay Burow, Claudina Lick, MD  Positive ROS: All other systems have been reviewed and were otherwise negative with the exception of those mentioned in the HPI and as above.  Physical Exam: General: Alert, no acute distress Cardiovascular: No pedal edema Respiratory: No cyanosis, no use of accessory musculature GI: abdomen soft Skin: No lesions in the area of chief complaint Neurologic: Sensation intact distally Psychiatric: Patient is competent for consent with normal mood and affect Lymphatic: no lymphedema  MUSCULOSKELETAL: exam stable  Assessment: left long finger PIP nodule/calcification  Plan: Plan for Procedure(s): LEFT LONG FINGER NODULE EXCISION  The risks benefits and alternatives were discussed with the patient including but not limited to the risks of nonoperative treatment, versus surgical intervention including infection, bleeding, nerve injury,  blood clots, cardiopulmonary complications, morbidity, mortality, among  others, and they were willing to proceed.   Glee Arvin, MD   07/27/2019 7:51 AM

## 2019-07-27 NOTE — Telephone Encounter (Signed)
I left voicemail for patient requesting return call.  Need to know what he needs work note to state.

## 2019-07-27 NOTE — Anesthesia Postprocedure Evaluation (Signed)
Anesthesia Post Note  Patient: Evan Burns  Procedure(s) Performed: LEFT LONG FINGER NODULE EXCISION (Left Hand)     Patient location during evaluation: PACU Anesthesia Type: Bier Block Level of consciousness: awake Pain management: pain level controlled Vital Signs Assessment: post-procedure vital signs reviewed and stable Respiratory status: spontaneous breathing Cardiovascular status: stable Postop Assessment: no apparent nausea or vomiting Anesthetic complications: no    Last Vitals:  Vitals:   07/27/19 1245 07/27/19 1300  BP: 115/72 131/73  Pulse: 62 62  Resp: 11 14  Temp:    SpO2: 100% 100%    Last Pain:  Vitals:   07/27/19 1300  TempSrc:   PainSc: 0-No pain                 Felicite Zeimet

## 2019-07-27 NOTE — Discharge Instructions (Signed)
Postoperative instructions:  Weightbearing instructions: as tolerated  Dressing instructions: Keep your dressing and/or splint clean and dry at all times.  It will be removed at your first post-operative appointment.  Your stitches and/or staples will be removed at this visit.  Incision instructions:  Do not soak your incision for 3 weeks after surgery.  If the incision gets wet, pat dry and do not scrub the incision.  Pain control:  You have been given a prescription to be taken as directed for post-operative pain control.  In addition, elevate the operative extremity above the heart at all times to prevent swelling and throbbing pain.  Take over-the-counter Colace, 100mg  by mouth twice a day while taking narcotic pain medications to help prevent constipation.  Follow up appointments: 1) 7 days for suture removal and wound check. 2) Evan Burns as scheduled.   -------------------------------------------------------------------------------------------------------------  After Surgery Pain Control:  After your surgery, post-surgical discomfort or pain is likely. This discomfort can last several days to a few weeks. At certain times of the day your discomfort may be more intense.  Did you receive a nerve block?  A nerve block can provide pain relief for one hour to two days after your surgery. As long as the nerve block is working, you will experience little or no sensation in the area the surgeon operated on.  As the nerve block wears off, you will begin to experience pain or discomfort. It is very important that you begin taking your prescribed pain medication before the nerve block fully wears off. Treating your pain at the first sign of the block wearing off will ensure your pain is better controlled and more tolerable when full-sensation returns. Do not wait until the pain is intolerable, as the medicine will be less effective. It is better to treat pain in advance than to try and catch up.    General Anesthesia:  If you did not receive a nerve block during your surgery, you will need to start taking your pain medication shortly after your surgery and should continue to do so as prescribed by your surgeon.  Pain Medication:  Most commonly we prescribe Vicodin and Percocet for post-operative pain. Both of these medications contain a combination of acetaminophen (Tylenol) and a narcotic to help control pain.   It takes between 30 and 45 minutes before pain medication starts to work. It is important to take your medication before your pain level gets too intense.   Nausea is a common side effect of many pain medications. You will want to eat something before taking your pain medicine to help prevent nausea.   If you are taking a prescription pain medication that contains acetaminophen, we recommend that you do not take additional over the counter acetaminophen (Tylenol).  Other pain relieving options:   Using a cold pack to ice the affected area a few times a day (15 to 20 minutes at a time) can help to relieve pain, reduce swelling and bruising.   Elevation of the affected area can also help to reduce pain and swelling.    Post Anesthesia Home Care Instructions  Activity: Get plenty of rest for the remainder of the day. A responsible individual must stay with you for 24 hours following the procedure.  For the next 24 hours, DO NOT: -Drive a car -Paediatric nurse -Drink alcoholic beverages -Take any medication unless instructed by your physician -Make any legal decisions or sign important papers.  Meals: Start with liquid foods such as gelatin  or soup. Progress to regular foods as tolerated. Avoid greasy, spicy, heavy foods. If nausea and/or vomiting occur, drink only clear liquids until the nausea and/or vomiting subsides. Call your physician if vomiting continues.  Special Instructions/Symptoms: Your throat may feel dry or sore from the anesthesia or the breathing tube  placed in your throat during surgery. If this causes discomfort, gargle with warm salt water. The discomfort should disappear within 24 hours.  If you had a scopolamine patch placed behind your ear for the management of post- operative nausea and/or vomiting:  1. The medication in the patch is effective for 72 hours, after which it should be removed.  Wrap patch in a tissue and discard in the trash. Wash hands thoroughly with soap and water. 2. You may remove the patch earlier than 72 hours if you experience unpleasant side effects which may include dry mouth, dizziness or visual disturbances. 3. Avoid touching the patch. Wash your hands with soap and water after contact with the patch.      Call your surgeon if you experience:   1.  Fever over 101.0. 2.  Inability to urinate. 3.  Nausea and/or vomiting. 4.  Extreme swelling or bruising at the surgical site. 5.  Continued bleeding from the incision. 6.  Increased pain, redness or drainage from the incision. 7.  Problems related to your pain medication. 8.  Any problems and/or concerns

## 2019-07-27 NOTE — Telephone Encounter (Signed)
Patient called. Would like a work note. His call back number is [phone]

## 2019-07-27 NOTE — Anesthesia Procedure Notes (Signed)
Anesthesia Regional Block: Bier block (IV Regional)   Pre-Anesthetic Checklist: ,, timeout performed, Correct Patient, Correct Site, Correct Laterality, Correct Procedure,, site marked, surgical consent,, at surgeon's request  Laterality: Left  Prep: chloraprep       Needles:  Injection technique: Single-shot  Needle Type: Other      Needle Gauge: 20     Additional Needles:   Procedures:,,,,, intact distal pulses, Esmarch exsanguination, single tourniquet utilized,  Narrative:  Start time: 07/27/2019 11:52 AM End time: 07/27/2019 11:53 AM Injection made incrementally with aspirations every 35 mL.  Performed by: Personally   Additional Notes: Esmark wrap, torq up 250, neg pulse, slowly injected 35 ml 0.5% pres free lido, pt tol well

## 2019-07-27 NOTE — Op Note (Signed)
   Date of Surgery: 07/27/2019  INDICATIONS: Mr. Gettel is a 38 y.o.-year-old male with a left long finger PIP nodule;  The patient did consent to the procedure after discussion of the risks and benefits.  PREOPERATIVE DIAGNOSIS: Left long finger PIP nodule 1 cm in diameter  POSTOPERATIVE DIAGNOSIS: Same.  PROCEDURE: Removal of left long finger nodule overlying PIP joint 1 cm  SURGEON: N. Eduard Roux, M.D.  ASSIST: Ciro Backer Cos[place], [place]; necessary for the timely completion of procedure and due to complexity of procedure.  ANESTHESIA: Bier block  IV FLUIDS AND URINE: See anesthesia.  ESTIMATED BLOOD LOSS: Minimal mL.  IMPLANTS: None  DRAINS: None  COMPLICATIONS: see description of procedure.  DESCRIPTION OF PROCEDURE: The patient was brought to the operating room and placed supine on the operating table.  The patient had been signed prior to the procedure and this was documented. The patient had the anesthesia placed by the anesthesiologist.  A time-out was performed to confirm that this was the correct patient, site, side and location. The patient did receive antibiotics prior to the incision and was re-dosed during the procedure as needed at indicated intervals.  A tourniquet was placed on the upper forearm.  The patient had the operative extremity prepped and draped in the standard surgical fashion.    Longitudinal incision was made along the PIP joint which was curved both radially and ulnarly around the nodule.  Dissection was carried sharply down onto the PIP joint with a Beaver blade.  The nodule appeared to be calcified mass without any aggressive features.  This was removed without difficulty.  The nodule was sent to pathology.  The surgical wound was then thoroughly irrigated.  Hemostasis was obtained.  Local anesthetic was infiltrated in the soft tissues.  The skin was approximated with interrupted 4-0 nylon sutures.  Sterile dressings were applied.  Patient tolerated  procedure well had no immediate complications.  POSTOPERATIVE PLAN: Follow-up in 1 week.  Azucena Cecil, MD 12:11 PM

## 2019-07-27 NOTE — Anesthesia Preprocedure Evaluation (Addendum)
Anesthesia Evaluation  Patient identified by MRN, date of birth, ID band Patient awake    Reviewed: Allergy & Precautions, NPO status , Patient's Chart, lab work & pertinent test results  Airway Mallampati: II  TM Distance: >3 FB     Dental   Pulmonary asthma ,    breath sounds clear to auscultation       Cardiovascular hypertension,  Rhythm:Regular Rate:Normal     Neuro/Psych  Neuromuscular disease    GI/Hepatic negative GI ROS, Neg liver ROS,   Endo/Other  negative endocrine ROS  Renal/GU negative Renal ROS     Musculoskeletal   Abdominal   Peds  Hematology   Anesthesia Other Findings   Reproductive/Obstetrics                             Anesthesia Physical Anesthesia Plan  ASA: III  Anesthesia Plan: Bier Block and Bier Block-LIDOCAINE ONLY   Post-op Pain Management:    Induction: Intravenous  PONV Risk Score and Plan: 1 and Ondansetron, Dexamethasone and Midazolam  Airway Management Planned: Nasal Cannula and Simple Face Mask  Additional Equipment:   Intra-op Plan:   Post-operative Plan:   Informed Consent: I have reviewed the patients History and Physical, chart, labs and discussed the procedure including the risks, benefits and alternatives for the proposed anesthesia with the patient or authorized representative who has indicated his/her understanding and acceptance.     Dental advisory given  Plan Discussed with: CRNA and Anesthesiologist  Anesthesia Plan Comments:         Anesthesia Quick Evaluation

## 2019-07-30 ENCOUNTER — Encounter (HOSPITAL_BASED_OUTPATIENT_CLINIC_OR_DEPARTMENT_OTHER): Payer: Self-pay | Admitting: Orthopaedic Surgery

## 2019-07-30 ENCOUNTER — Telehealth: Payer: Self-pay | Admitting: Orthopaedic Surgery

## 2019-07-30 LAB — SURGICAL PATHOLOGY

## 2019-07-30 NOTE — Telephone Encounter (Signed)
yes

## 2019-07-30 NOTE — Telephone Encounter (Signed)
Okay for note

## 2019-07-30 NOTE — Telephone Encounter (Signed)
Patient returned call. Duplicate message in chart.

## 2019-07-30 NOTE — Telephone Encounter (Signed)
Patient called. Says he needs a work note to say that he had surgery on Friday and out from that date til tomorrow. His call back number is [phone]

## 2019-07-31 NOTE — Telephone Encounter (Signed)
Note made ready for pick up at the front desk.

## 2019-08-03 ENCOUNTER — Other Ambulatory Visit: Payer: Self-pay

## 2019-08-03 ENCOUNTER — Encounter: Payer: Self-pay | Admitting: Orthopaedic Surgery

## 2019-08-03 ENCOUNTER — Ambulatory Visit (INDEPENDENT_AMBULATORY_CARE_PROVIDER_SITE_OTHER): Payer: Managed Care, Other (non HMO) | Admitting: Orthopaedic Surgery

## 2019-08-03 DIAGNOSIS — R2232 Localized swelling, mass and lump, left upper limb: Secondary | ICD-10-CM

## 2019-08-03 NOTE — Progress Notes (Signed)
Patient ID: ARVILLE POSTLEWAITE, male   DOB: Mar 05, 1981, 38 y.o.   MRN: [id number]  [name] is 1 week status post removal a nodule from has left long finger.  He is doing well.  He has no complaints.  Exam shows fully healed surgical incision with intact sutures.  He has mild stiffness of the PIP joint.  We removed the sutures today and will begin Bactroban ointment twice a day with a Band-Aid.  He will increase his range of motion and use of the hand as tolerated.  Pathology was negative for malignancy or infection.  Follow-up in 3 weeks for recheck.  If doing well anticipate discharging at that time.

## 2019-08-24 ENCOUNTER — Ambulatory Visit (INDEPENDENT_AMBULATORY_CARE_PROVIDER_SITE_OTHER): Payer: Managed Care, Other (non HMO) | Admitting: Orthopaedic Surgery

## 2019-08-24 DIAGNOSIS — R2232 Localized swelling, mass and lump, left upper limb: Secondary | ICD-10-CM

## 2019-08-24 NOTE — Progress Notes (Signed)
Patient ID: Evan Burns, male   DOB: [birth year], 38 y.o.   MRN: [id number]  [name] is approximately 1 month status post nodule removal from his left long finger.  He is doing well.  No complaints.  The surgical site has fully healed.  He is able to make a full composite fist.  There is no swelling or tenderness or signs of infection.  At this time his surgical site has healed well.  He has regained full use of his finger and hand.  He can follow-up with [place] as needed.

## 2019-09-07 ENCOUNTER — Other Ambulatory Visit: Payer: Self-pay

## 2019-09-07 ENCOUNTER — Ambulatory Visit (INDEPENDENT_AMBULATORY_CARE_PROVIDER_SITE_OTHER): Payer: Managed Care, Other (non HMO)

## 2019-09-07 DIAGNOSIS — Z23 Encounter for immunization: Secondary | ICD-10-CM

## 2019-12-05 ENCOUNTER — Telehealth: Payer: Self-pay | Admitting: Internal Medicine

## 2019-12-05 MED ORDER — AMLODIPINE BESYLATE 10 MG PO TABS: 10.0000 mg | ORAL_TABLET | Freq: Every day | ORAL | 0 refills | Status: DC

## 2019-12-05 NOTE — Telephone Encounter (Signed)
  *  STAT* If patient is at the pharmacy, call can be transferred to refill team.   1. Which medications need to be refilled? (please list name of each medication and dose if known) amLODipine (NORVASC) 10 MG tablet, losartan (COZAAR) 100 MG tablet  2. Which pharmacy/location (including street and city if local pharmacy) is medication to be sent to? [facility], [place]  3. Do they need a 30 day or 90 day supply? 90

## 2019-12-05 NOTE — Telephone Encounter (Signed)
Rx sent 

## 2020-02-21 ENCOUNTER — Encounter: Payer: Managed Care, Other (non HMO) | Admitting: Internal Medicine

## 2020-03-03 NOTE — Patient Instructions (Signed)
Blood work was ordered.    All other Health Maintenance issues reviewed.   All recommended immunizations and age-appropriate screenings are up-to-date or discussed.  No immunization administered today.   Medications reviewed and updated.  Changes include :     Your prescription(s) have been submitted to your pharmacy. Please take as directed and contact our office if you believe you are having problem(s) with the medication(s).  A referral was ordered for    Please followup in 6 months    Health Maintenance, Male Adopting a healthy lifestyle and getting preventive care are important in promoting health and wellness. Ask your health care provider about:  The right schedule for you to have regular tests and exams.  Things you can do on your own to prevent diseases and keep yourself healthy. What should I know about diet, weight, and exercise? Eat a healthy diet   Eat a diet that includes plenty of vegetables, fruits, low-fat dairy products, and lean protein.  Do not eat a lot of foods that are high in solid fats, added sugars, or sodium. Maintain a healthy weight Body mass index (BMI) is a measurement that can be used to identify possible weight problems. It estimates body fat based on height and weight. Your health care provider can help determine your BMI and help you achieve or maintain a healthy weight. Get regular exercise Get regular exercise. This is one of the most important things you can do for your health. Most adults should:  Exercise for at least 150 minutes each week. The exercise should increase your heart rate and make you sweat (moderate-intensity exercise).  Do strengthening exercises at least twice a week. This is in addition to the moderate-intensity exercise.  Spend less time sitting. Even light physical activity can be beneficial. Watch cholesterol and blood lipids Have your blood tested for lipids and cholesterol at 38 years of age, then have this test  every 5 years. You may need to have your cholesterol levels checked more often if:  Your lipid or cholesterol levels are high.  You are older than 38 years of age.  You are at high risk for heart disease. What should I know about cancer screening? Many types of cancers can be detected early and may often be prevented. Depending on your health history and family history, you may need to have cancer screening at various ages. This may include screening for:  Colorectal cancer.  Prostate cancer.  Skin cancer.  Lung cancer. What should I know about heart disease, diabetes, and high blood pressure? Blood pressure and heart disease  High blood pressure causes heart disease and increases the risk of stroke. This is more likely to develop in people who have high blood pressure readings, are of African descent, or are overweight.  Talk with your health care provider about your target blood pressure readings.  Have your blood pressure checked: ? Every 3-5 years if you are 18-39 years of age. ? Every year if you are 40 years old or older.  If you are between the ages of 65 and 75 and are a current or former smoker, ask your health care provider if you should have a one-time screening for abdominal aortic aneurysm (AAA). Diabetes Have regular diabetes screenings. This checks your fasting blood sugar level. Have the screening done:  Once every three years after age 45 if you are at a normal weight and have a low risk for diabetes.  More often and at a younger age if   you are overweight or have a high risk for diabetes. What should I know about preventing infection? Hepatitis B If you have a higher risk for hepatitis B, you should be screened for this virus. Talk with your health care provider to find out if you are at risk for hepatitis B infection. Hepatitis C Blood testing is recommended for:  Everyone born from 1945 through 1965.  Anyone with known risk factors for hepatitis  C. Sexually transmitted infections (STIs)  You should be screened each year for STIs, including gonorrhea and chlamydia, if: ? You are sexually active and are younger than 38 years of age. ? You are older than 38 years of age and your health care provider tells you that you are at risk for this type of infection. ? Your sexual activity has changed since you were last screened, and you are at increased risk for chlamydia or gonorrhea. Ask your health care provider if you are at risk.  Ask your health care provider about whether you are at high risk for HIV. Your health care provider may recommend a prescription medicine to help prevent HIV infection. If you choose to take medicine to prevent HIV, you should first get tested for HIV. You should then be tested every 3 months for as long as you are taking the medicine. Follow these instructions at home: Lifestyle  Do not use any products that contain nicotine or tobacco, such as cigarettes, e-cigarettes, and chewing tobacco. If you need help quitting, ask your health care provider.  Do not use street drugs.  Do not share needles.  Ask your health care provider for help if you need support or information about quitting drugs. Alcohol use  Do not drink alcohol if your health care provider tells you not to drink.  If you drink alcohol: ? Limit how much you have to 0-2 drinks a day. ? Be aware of how much alcohol is in your drink. In the [place] one drink equals one 12 oz bottle of beer (355 mL), one 5 oz glass of wine (148 mL), or one 1 oz glass of hard liquor (44 mL). General instructions  Schedule regular health, dental, and eye exams.  Stay current with your vaccines.  Tell your health care provider if: ? You often feel depressed. ? You have ever been abused or do not feel safe at home. Summary  Adopting a healthy lifestyle and getting preventive care are important in promoting health and wellness.  Follow your health care  provider's instructions about healthy diet, exercising, and getting tested or screened for diseases.  Follow your health care provider's instructions on monitoring your cholesterol and blood pressure. This information is not intended to replace advice given to you by your health care provider. Make sure you discuss any questions you have with your health care provider. Document Revised: 10/04/2018 Document Reviewed: 10/04/2018 Elsevier Patient Education  2020 Elsevier Inc.  

## 2020-03-03 NOTE — Progress Notes (Signed)
Subjective:    Patient ID: Evan Burns, male    DOB: 1981/07/25, 39 y.o.   MRN: [id number]  HPI He is here for a physical exam.     Medications and allergies reviewed with patient and updated if appropriate.  Patient Active Problem List   Diagnosis Date Noted  . Subcutaneous mass of finger of left hand 07/27/2019  . Nodule of skin of finger of left hand 07/12/2019  . Chest mass 02/14/2019  . Other chest pain 02/14/2019  . Superficial phlebitis, chest wall 02/14/2019  . Fatigue 11/22/2018  . Hyperglycemia 09/08/2017  . Essential hypertension 10/12/2010  . Dermatomyositis ([facility]) 06/17/2008  . Asthma 02/22/2007    Current Outpatient Medications on File Prior to Visit  Medication Sig Dispense Refill  . albuterol (PROVENTIL HFA;VENTOLIN HFA) 108 (90 Base) MCG/ACT inhaler Inhale 2 puffs into the lungs every 6 (six) hours as needed for wheezing or shortness of breath. 18 g 1  . amLODipine (NORVASC) 10 MG tablet Take 1 tablet (10 mg total) by mouth daily. Need office visit for more refills 90 tablet 0  . budesonide-formoterol (SYMBICORT) 160-4.5 MCG/ACT inhaler Inhale 2 puffs into the lungs 2 (two) times daily. 1 Inhaler 2  . COD LIVER OIL PO Take 1 capsule by mouth daily.    . Creatine POWD Take 1 Scoop by mouth.    Marland Kitchen HYDROcodone-acetaminophen (NORCO) 5-325 MG tablet Take 1-2 tablets by mouth 3 (three) times daily as needed. (Patient not taking: Reported on 08/24/2019) 30 tablet 0  . losartan (COZAAR) 100 MG tablet take 1 tablet by mouth once daily. 90 tablet 1  . Omega-3 Fatty Acids (FISH OIL) 1000 MG CAPS Take 1,000 mg by mouth daily.      No current facility-administered medications on file prior to visit.    Past Medical History:  Diagnosis Date  . Asthma   . Dermatomyositis ([facility])   . Hypertension     Past Surgical History:  Procedure Laterality Date  . HAND SURGERY    . INCISION AND DRAINAGE OF WOUND    . MASS EXCISION Left 07/27/2019   Procedure: LEFT LONG  FINGER NODULE EXCISION;  Surgeon: Leandrew Koyanagi, MD;  Location: [facility];  Service: Orthopedics;  Laterality: Left;  . TYMPANOSTOMY TUBE PLACEMENT      Social History   Socioeconomic History  . Marital status: Married    Spouse name: Not on file  . Number of children: Not on file  . Years of education: Not on file  . Highest education level: Not on file  Occupational History  . Not on file  Tobacco Use  . Smoking status: Never Smoker  . Smokeless tobacco: Never Used  Substance and Sexual Activity  . Alcohol use: No  . Drug use: No  . Sexual activity: Not on file  Other Topics Concern  . Not on file  Social History Narrative   Pt lives at home w/ parents       Very active at work - works two jobs   Social Determinants of Radio broadcast assistant Strain:   . Difficulty of Paying Living Expenses:   Food Insecurity:   . Worried About Charity fundraiser in the Last Year:   . Arboriculturist in the Last Year:   Transportation Needs:   . Film/video editor (Medical):   Marland Kitchen Lack of Transportation (Non-Medical):   Physical Activity:   . Days of Exercise per Week:   .  Minutes of Exercise per Session:   Stress:   . Feeling of Stress :   Social Connections:   . Frequency of Communication with Friends and Family:   . Frequency of Social Gatherings with Friends and Family:   . Attends Religious Services:   . Active Member of Clubs or Organizations:   . Attends Banker Meetings:   Marland Kitchen Marital Status:     Family History  Problem Relation Age of Onset  . Hypertension Mother   . Diabetes Mother   . Kidney disease Mother   . Hyperlipidemia Mother   . Hypertension Father   . Hyperlipidemia Father     Review of Systems     Objective:  There were no vitals filed for this visit. There were no vitals filed for this visit. There is no height or weight on file to calculate BMI.  BP Readings from Last 3 Encounters:  07/27/19 132/87    05/03/19 (!) 150/95  04/18/19 (!) 146/91    Wt Readings from Last 3 Encounters:  07/27/19 233 lb 11 oz (106 kg)  05/03/19 230 lb (104.3 kg)  02/14/19 236 lb (107 kg)     Physical Exam Constitutional: He appears well-developed and well-nourished. No distress.  HENT:  Head: Normocephalic and atraumatic.  Right Ear: External ear normal.  Left Ear: External ear normal.  Mouth/Throat: Oropharynx is clear and moist.  Normal ear canals and TM b/l  Eyes: Conjunctivae and EOM are normal.  Neck: Neck supple. No tracheal deviation present. No thyromegaly present.  No carotid bruit  Cardiovascular: Normal rate, regular rhythm, normal heart sounds and intact distal pulses.   No murmur heard. Pulmonary/Chest: Effort normal and breath sounds normal. No respiratory distress. He has no wheezes. He has no rales.  Abdominal: Soft. He exhibits no distension. There is no tenderness.  Genitourinary: deferred  Musculoskeletal: He exhibits no edema.  Lymphadenopathy:   He has no cervical adenopathy.  Skin: Skin is warm and dry. He is not diaphoretic.  Psychiatric: He has a normal mood and affect. His behavior is normal.         Assessment & Plan:   Physical exam: Screening blood work  ordered Immunizations    Discussed covid vaccine, others up to date  Exercise    Weight   Substance abuse   none  See Problem List for Assessment and Plan of chronic medical problems.    This visit occurred during the SARS-CoV-2 public health emergency.  Safety protocols were in place, including screening questions prior to the visit, additional usage of staff PPE, and extensive cleaning of exam room while observing appropriate contact time as indicated for disinfecting solutions.    This encounter was created in error - please disregard.

## 2020-03-04 ENCOUNTER — Encounter: Payer: Self-pay | Admitting: Internal Medicine

## 2020-03-04 ENCOUNTER — Other Ambulatory Visit: Payer: Self-pay

## 2020-03-17 NOTE — Progress Notes (Signed)
Subjective:    Patient ID: Evan Burns, male    DOB: 1981-10-10, 39 y.o.   MRN: [id number]  HPI He is here for a physical exam.   He sleeps only 2-3 hrs of sleep at night.  He works two jobs - 5 days a week he works two jobs. He is thinking about quitting his part time job.  He is tired all the time.  He is exercising regularly    He has not had his BP medications for three weeks.   Medications and allergies reviewed with patient and updated if appropriate.  Patient Active Problem List   Diagnosis Date Noted  . Subcutaneous mass of finger of left hand 07/27/2019  . Nodule of skin of finger of left hand 07/12/2019  . Chest mass 02/14/2019  . Other chest pain 02/14/2019  . Superficial phlebitis, chest wall 02/14/2019  . Fatigue 11/22/2018  . Hyperglycemia 09/08/2017  . Essential hypertension 10/12/2010  . Dermatomyositis ([facility]) 06/17/2008  . Asthma 02/22/2007    Current Outpatient Medications on File Prior to Visit  Medication Sig Dispense Refill  . albuterol (PROVENTIL HFA;VENTOLIN HFA) 108 (90 Base) MCG/ACT inhaler Inhale 2 puffs into the lungs every 6 (six) hours as needed for wheezing or shortness of breath. 18 g 1  . budesonide-formoterol (SYMBICORT) 160-4.5 MCG/ACT inhaler Inhale 2 puffs into the lungs 2 (two) times daily. 1 Inhaler 2  . COD LIVER OIL PO Take 1 capsule by mouth daily.    . Creatine POWD Take 1 Scoop by mouth.    . Omega-3 Fatty Acids (FISH OIL) 1000 MG CAPS Take 1,000 mg by mouth daily.      No current facility-administered medications on file prior to visit.    Past Medical History:  Diagnosis Date  . Asthma   . Dermatomyositis ([facility])   . Hypertension     Past Surgical History:  Procedure Laterality Date  . HAND SURGERY    . INCISION AND DRAINAGE OF WOUND    . MASS EXCISION Left 07/27/2019   Procedure: LEFT LONG FINGER NODULE EXCISION;  Surgeon: Tarry Kos, MD;  Location: [facility];  Service: Orthopedics;  Laterality:  Left;  . TYMPANOSTOMY TUBE PLACEMENT      Social History   Socioeconomic History  . Marital status: Married    Spouse name: Not on file  . Number of children: Not on file  . Years of education: Not on file  . Highest education level: Not on file  Occupational History  . Not on file  Tobacco Use  . Smoking status: Never Smoker  . Smokeless tobacco: Never Used  Substance and Sexual Activity  . Alcohol use: No  . Drug use: No  . Sexual activity: Not on file  Other Topics Concern  . Not on file  Social History Narrative   Pt lives at home w/ parents       Very active at work - works two jobs   Social Determinants of Corporate investment banker Strain:   . Difficulty of Paying Living Expenses:   Food Insecurity:   . Worried About Programme researcher, broadcasting/film/video in the Last Year:   . Barista in the Last Year:   Transportation Needs:   . Freight forwarder (Medical):   Marland Kitchen Lack of Transportation (Non-Medical):   Physical Activity:   . Days of Exercise per Week:   . Minutes of Exercise per Session:   Stress:   .  Feeling of Stress :   Social Connections:   . Frequency of Communication with Friends and Family:   . Frequency of Social Gatherings with Friends and Family:   . Attends Religious Services:   . Active Member of Clubs or Organizations:   . Attends Archivist Meetings:   Marland Kitchen Marital Status:     Family History  Problem Relation Age of Onset  . Hypertension Mother   . Diabetes Mother   . Kidney disease Mother   . Hyperlipidemia Mother   . Hypertension Father   . Hyperlipidemia Father     Review of Systems  Constitutional: Negative for chills and fever.  Eyes: Negative for visual disturbance.  Respiratory: Negative for cough, shortness of breath and wheezing.   Cardiovascular: Positive for leg swelling. Negative for chest pain and palpitations.  Gastrointestinal: Positive for anal bleeding (6 months ago - on tissue). Negative for abdominal pain,  blood in stool, constipation, diarrhea and nausea.       No gerd  Genitourinary: Negative for difficulty urinating, dysuria and hematuria.  Musculoskeletal: Positive for arthralgias (knees, ankles).  Skin: Positive for rash (skin peeling on face, and rash). Negative for color change.  Neurological: Positive for headaches (occ). Negative for light-headedness.  Psychiatric/Behavioral: Negative for dysphoric mood. The patient is not nervous/anxious.        Objective:   Vitals:   03/18/20 0850  BP: (!) 158/100  Pulse: 86  Resp: 16  Temp: 98.5 F (36.9 C)  SpO2: 98%   Filed Weights   03/18/20 0850  Weight: 235 lb (106.6 kg)   Body mass index is 36.81 kg/m.  BP Readings from Last 3 Encounters:  03/18/20 (!) 158/100  07/27/19 132/87  05/03/19 (!) 150/95    Wt Readings from Last 3 Encounters:  03/18/20 235 lb (106.6 kg)  07/27/19 233 lb 11 oz (106 kg)  05/03/19 230 lb (104.3 kg)     Physical Exam Constitutional: He appears well-developed and well-nourished. No distress.  HENT:  Head: Normocephalic and atraumatic.  Right Ear: External ear normal.  Left Ear: External ear normal.  Mouth/Throat: Oropharynx is clear and moist.  Normal ear canals and TM b/l  Eyes: Conjunctivae and EOM are normal.  Neck: Neck supple. No tracheal deviation present. No thyromegaly present.  No carotid bruit  Cardiovascular: Normal rate, regular rhythm, normal heart sounds and intact distal pulses.   No murmur heard. Pulmonary/Chest: Effort normal and breath sounds normal. No respiratory distress. He has no wheezes. He has no rales.  Abdominal: Soft. He exhibits no distension. There is no tenderness.  Genitourinary: deferred  Musculoskeletal: He exhibits no edema.  Lymphadenopathy:   He has no cervical adenopathy.  Skin: Skin is warm and dry. He is not diaphoretic. Rough appearing skin on face, neck, arms Psychiatric: He has a normal mood and affect. His behavior is normal.          Assessment & Plan:   Physical exam: Screening blood work  ordered Immunizations    Had covid vaccine, others up to date  Exercise   Going to gym Weight  Obese - but has more muscle mass Substance abuse   none  See Problem List for Assessment and Plan of chronic medical problems.    This visit occurred during the SARS-CoV-2 public health emergency.  Safety protocols were in place, including screening questions prior to the visit, additional usage of staff PPE, and extensive cleaning of exam room while observing appropriate contact time as indicated  for disinfecting solutions.

## 2020-03-17 NOTE — Patient Instructions (Addendum)
Blood work was ordered.    All other Health Maintenance issues reviewed.   All recommended immunizations and age-appropriate screenings are up-to-date or discussed.  No immunization administered today.   Medications reviewed and updated.  Changes include :   none  Your prescription(s) have been submitted to your pharmacy. Please take as directed and contact our office if you believe you are having problem(s) with the medication(s).    Please followup in 6 months    Health Maintenance, Male Adopting a healthy lifestyle and getting preventive care are important in promoting health and wellness. Ask your health care provider about:  The right schedule for you to have regular tests and exams.  Things you can do on your own to prevent diseases and keep yourself healthy. What should I know about diet, weight, and exercise? Eat a healthy diet   Eat a diet that includes plenty of vegetables, fruits, low-fat dairy products, and lean protein.  Do not eat a lot of foods that are high in solid fats, added sugars, or sodium. Maintain a healthy weight Body mass index (BMI) is a measurement that can be used to identify possible weight problems. It estimates body fat based on height and weight. Your health care provider can help determine your BMI and help you achieve or maintain a healthy weight. Get regular exercise Get regular exercise. This is one of the most important things you can do for your health. Most adults should:  Exercise for at least 150 minutes each week. The exercise should increase your heart rate and make you sweat (moderate-intensity exercise).  Do strengthening exercises at least twice a week. This is in addition to the moderate-intensity exercise.  Spend less time sitting. Even light physical activity can be beneficial. Watch cholesterol and blood lipids Have your blood tested for lipids and cholesterol at 38 years of age, then have this test every 5 years. You may  need to have your cholesterol levels checked more often if:  Your lipid or cholesterol levels are high.  You are older than 38 years of age.  You are at high risk for heart disease. What should I know about cancer screening? Many types of cancers can be detected early and may often be prevented. Depending on your health history and family history, you may need to have cancer screening at various ages. This may include screening for:  Colorectal cancer.  Prostate cancer.  Skin cancer.  Lung cancer. What should I know about heart disease, diabetes, and high blood pressure? Blood pressure and heart disease  High blood pressure causes heart disease and increases the risk of stroke. This is more likely to develop in people who have high blood pressure readings, are of African descent, or are overweight.  Talk with your health care provider about your target blood pressure readings.  Have your blood pressure checked: ? Every 3-5 years if you are 18-39 years of age. ? Every year if you are 40 years old or older.  If you are between the ages of 65 and 75 and are a current or former smoker, ask your health care provider if you should have a one-time screening for abdominal aortic aneurysm (AAA). Diabetes Have regular diabetes screenings. This checks your fasting blood sugar level. Have the screening done:  Once every three years after age 45 if you are at a normal weight and have a low risk for diabetes.  More often and at a younger age if you are overweight or have a   high risk for diabetes. What should I know about preventing infection? Hepatitis B If you have a higher risk for hepatitis B, you should be screened for this virus. Talk with your health care provider to find out if you are at risk for hepatitis B infection. Hepatitis C Blood testing is recommended for:  Everyone born from 1945 through 1965.  Anyone with known risk factors for hepatitis C. Sexually transmitted  infections (STIs)  You should be screened each year for STIs, including gonorrhea and chlamydia, if: ? You are sexually active and are younger than 38 years of age. ? You are older than 38 years of age and your health care provider tells you that you are at risk for this type of infection. ? Your sexual activity has changed since you were last screened, and you are at increased risk for chlamydia or gonorrhea. Ask your health care provider if you are at risk.  Ask your health care provider about whether you are at high risk for HIV. Your health care provider may recommend a prescription medicine to help prevent HIV infection. If you choose to take medicine to prevent HIV, you should first get tested for HIV. You should then be tested every 3 months for as long as you are taking the medicine. Follow these instructions at home: Lifestyle  Do not use any products that contain nicotine or tobacco, such as cigarettes, e-cigarettes, and chewing tobacco. If you need help quitting, ask your health care provider.  Do not use street drugs.  Do not share needles.  Ask your health care provider for help if you need support or information about quitting drugs. Alcohol use  Do not drink alcohol if your health care provider tells you not to drink.  If you drink alcohol: ? Limit how much you have to 0-2 drinks a day. ? Be aware of how much alcohol is in your drink. In the [place] one drink equals one 12 oz bottle of beer (355 mL), one 5 oz glass of wine (148 mL), or one 1 oz glass of hard liquor (44 mL). General instructions  Schedule regular health, dental, and eye exams.  Stay current with your vaccines.  Tell your health care provider if: ? You often feel depressed. ? You have ever been abused or do not feel safe at home. Summary  Adopting a healthy lifestyle and getting preventive care are important in promoting health and wellness.  Follow your health care provider's instructions about  healthy diet, exercising, and getting tested or screened for diseases.  Follow your health care provider's instructions on monitoring your cholesterol and blood pressure. This information is not intended to replace advice given to you by your health care provider. Make sure you discuss any questions you have with your health care provider. Document Revised: 10/04/2018 Document Reviewed: 10/04/2018 Elsevier Patient Education  2020 Elsevier Inc.  

## 2020-03-18 ENCOUNTER — Ambulatory Visit (INDEPENDENT_AMBULATORY_CARE_PROVIDER_SITE_OTHER): Payer: Federal, State, Local not specified - PPO | Admitting: Internal Medicine

## 2020-03-18 ENCOUNTER — Encounter: Payer: Self-pay | Admitting: Internal Medicine

## 2020-03-18 ENCOUNTER — Other Ambulatory Visit: Payer: Self-pay

## 2020-03-18 VITALS — BP 158/100 | HR 86 | Temp 98.5°F | Resp 16 | Ht 67.0 in | Wt 235.0 lb

## 2020-03-18 DIAGNOSIS — J4520 Mild intermittent asthma, uncomplicated: Secondary | ICD-10-CM

## 2020-03-18 DIAGNOSIS — R739 Hyperglycemia, unspecified: Secondary | ICD-10-CM | POA: Diagnosis not present

## 2020-03-18 DIAGNOSIS — M3390 Dermatopolymyositis, unspecified, organ involvement unspecified: Secondary | ICD-10-CM | POA: Diagnosis not present

## 2020-03-18 DIAGNOSIS — Z0000 Encounter for general adult medical examination without abnormal findings: Secondary | ICD-10-CM | POA: Diagnosis not present

## 2020-03-18 DIAGNOSIS — I10 Essential (primary) hypertension: Secondary | ICD-10-CM | POA: Diagnosis not present

## 2020-03-18 LAB — COMPREHENSIVE METABOLIC PANEL
ALT: 25 U/L (ref 0–53)
AST: 22 U/L (ref 0–37)
Albumin: 4.3 g/dL (ref 3.5–5.2)
Alkaline Phosphatase: 43 U/L (ref 39–117)
BUN: 22 mg/dL (ref 6–23)
CO2: 26 mEq/L (ref 19–32)
Calcium: 8.9 mg/dL (ref 8.4–10.5)
Chloride: 107 mEq/L (ref 96–112)
Creatinine, Ser: 0.96 mg/dL (ref 0.40–1.50)
GFR: 105.99 mL/min (ref >60.00)
Glucose, Bld: 98 mg/dL (ref 70–99)
Potassium: 4.1 mEq/L (ref 3.5–5.1)
Sodium: 139 mEq/L (ref 135–145)
Total Bilirubin: 0.3 mg/dL (ref 0.2–1.2)
Total Protein: 6.5 g/dL (ref 6.0–8.3)

## 2020-03-18 LAB — CBC WITH DIFFERENTIAL/PLATELET
Basophils Absolute: 0.1 10*3/uL (ref 0.0–0.1)
Basophils Relative: 2.8 % (ref 0.0–3.0)
Eosinophils Absolute: 0.2 10*3/uL (ref 0.0–0.7)
Eosinophils Relative: 4.5 % (ref 0.0–5.0)
HCT: 41.8 % (ref 39.0–52.0)
Hemoglobin: 13.7 g/dL (ref 13.0–17.0)
Lymphocytes Relative: 28.7 % (ref 12.0–46.0)
Lymphs Abs: 1.3 10*3/uL (ref 0.7–4.0)
MCHC: 32.8 g/dL (ref 30.0–36.0)
MCV: 82.6 fl (ref 78.0–100.0)
Monocytes Absolute: 0.4 10*3/uL (ref 0.1–1.0)
Monocytes Relative: 9.3 % (ref 3.0–12.0)
Neutro Abs: 2.6 10*3/uL (ref 1.4–7.7)
Neutrophils Relative %: 54.7 % (ref 43.0–77.0)
Platelets: 220 10*3/uL (ref 150.0–400.0)
RBC: 5.06 Mil/uL (ref 4.22–5.81)
RDW: 13.2 % (ref 11.5–15.5)
WBC: 4.7 10*3/uL (ref 4.0–10.5)

## 2020-03-18 LAB — LIPID PANEL
Cholesterol: 120 mg/dL (ref 0–200)
HDL: 42.4 mg/dL (ref >39.00)
LDL Cholesterol: 68 mg/dL (ref 0–99)
NonHDL: 77.51
Total CHOL/HDL Ratio: 3
Triglycerides: 47 mg/dL (ref 0.0–149.0)
VLDL: 9.4 mg/dL (ref 0.0–40.0)

## 2020-03-18 LAB — TSH: TSH: 0.95 u[IU]/mL (ref 0.35–4.50)

## 2020-03-18 LAB — HEMOGLOBIN A1C: Hgb A1c MFr Bld: 5.7 % (ref 4.6–6.5)

## 2020-03-18 MED ORDER — AMLODIPINE BESYLATE 10 MG PO TABS: 10.0000 mg | ORAL_TABLET | Freq: Every day | ORAL | 1 refills | Status: DC

## 2020-03-18 MED ORDER — LOSARTAN POTASSIUM 100 MG PO TABS: ORAL_TABLET | ORAL | 1 refills | Status: DC

## 2020-03-18 NOTE — Assessment & Plan Note (Signed)
Chronic BP elevated here today - he has not taken his medication for 3 weeks Restart medication Try to monitor BP at home cmp

## 2020-03-18 NOTE — Assessment & Plan Note (Signed)
Chronic-stable.

## 2020-03-18 NOTE — Assessment & Plan Note (Signed)
Chronic Controlled Mild, persistent symbicort once daily, albuterol prn continue

## 2020-03-18 NOTE — Assessment & Plan Note (Signed)
Chronic Family history of DM Check a1c Low sugar / carb diet Stressed regular exercise

## 2020-03-19 ENCOUNTER — Encounter: Payer: Self-pay | Admitting: Internal Medicine

## 2020-03-19 DIAGNOSIS — R7303 Prediabetes: Secondary | ICD-10-CM | POA: Insufficient documentation

## 2020-03-25 ENCOUNTER — Encounter: Payer: Self-pay | Admitting: Emergency Medicine

## 2020-03-25 ENCOUNTER — Other Ambulatory Visit: Payer: Self-pay

## 2020-03-25 ENCOUNTER — Ambulatory Visit
Admission: EM | Admit: 2020-03-25 | Discharge: 2020-03-25 | Disposition: A | Discharge status: Home / Self Care | Payer: Federal, State, Local not specified - PPO | Attending: Emergency Medicine | Admitting: Emergency Medicine

## 2020-03-25 DIAGNOSIS — M7751 Other enthesopathy of right foot: Secondary | ICD-10-CM | POA: Diagnosis not present

## 2020-03-25 NOTE — Discharge Instructions (Signed)

## 2020-03-25 NOTE — ED Provider Notes (Signed)
[name]    CSN: [id number] Arrival date & time: 03/25/20  1603      History   Chief Complaint Chief Complaint  Patient presents with  . Leg Swelling    HPI Evan Burns is a 39 y.o. male with history of asthma, hypertension, dermatomyositis presenting for right ankle pain and swelling.  Patient states he had bilateral ankle pain and swelling after working this past weekend (works in Proofreader, on his feet frequently), though has had persistent discomfort over dorsal lateral aspect of right ankle since.  Has tried rest, ice, elevation.  Denies numbness, deformity, trauma.   Past Medical History:  Diagnosis Date  . Asthma   . Dermatomyositis ([facility])   . Hypertension     Patient Active Problem List   Diagnosis Date Noted  . Prediabetes 03/19/2020  . Subcutaneous mass of finger of left hand 07/27/2019  . Nodule of skin of finger of left hand 07/12/2019  . Chest mass 02/14/2019  . Other chest pain 02/14/2019  . Superficial phlebitis, chest wall 02/14/2019  . Fatigue 11/22/2018  . Essential hypertension 10/12/2010  . Dermatomyositis ([facility]) 06/17/2008  . Asthma 02/22/2007    Past Surgical History:  Procedure Laterality Date  . HAND SURGERY    . INCISION AND DRAINAGE OF WOUND    . MASS EXCISION Left 07/27/2019   Procedure: LEFT LONG FINGER NODULE EXCISION;  Surgeon: Leandrew Koyanagi, MD;  Location: [facility];  Service: Orthopedics;  Laterality: Left;  . TYMPANOSTOMY TUBE PLACEMENT         Home Medications    Prior to Admission medications   Medication Sig Start Date End Date Taking? Authorizing Provider  albuterol (PROVENTIL HFA;VENTOLIN HFA) 108 (90 Base) MCG/ACT inhaler Inhale 2 puffs into the lungs every 6 (six) hours as needed for wheezing or shortness of breath. 11/22/18   Burns, Claudina Lick, MD  amLODipine (NORVASC) 10 MG tablet Take 1 tablet (10 mg total) by mouth daily. 03/18/20   Binnie Rail, MD  budesonide-formoterol (SYMBICORT)  160-4.5 MCG/ACT inhaler Inhale 2 puffs into the lungs 2 (two) times daily. 11/22/18   Binnie Rail, MD  COD LIVER OIL PO Take 1 capsule by mouth daily.    [provider]  Creatine POWD Take 1 Scoop by mouth.    [provider]  losartan (COZAAR) 100 MG tablet take 1 tablet by mouth once daily. 03/18/20   Binnie Rail, MD  Omega-3 Fatty Acids (FISH OIL) 1000 MG CAPS Take 1,000 mg by mouth daily.     [provider]    Family History Family History  Problem Relation Age of Onset  . Hypertension Mother   . Diabetes Mother   . Kidney disease Mother   . Hyperlipidemia Mother   . Hypertension Father   . Hyperlipidemia Father     Social History Social History   Tobacco Use  . Smoking status: Never Smoker  . Smokeless tobacco: Never Used  Substance Use Topics  . Alcohol use: No  . Drug use: No     Allergies   Sulfonamide derivatives, Cephalexin, and Lisinopril   Review of Systems As per HPI   Physical Exam Triage Vital Signs ED Triage Vitals  Enc Vitals Group     BP      Pulse      Resp      Temp      Temp src      SpO2  Weight      Height      Head Circumference      Peak Flow      Pain Score      Pain Loc      Pain Edu?      Excl. in GC?    No data found.  Updated Vital Signs BP (!) 149/86 (BP Location: Right Arm)   Pulse 94   Temp 98.9 F (37.2 C) (Oral)   Resp 18   SpO2 95%   Visual Acuity Right Eye Distance:   Left Eye Distance:   Bilateral Distance:    Right Eye Near:   Left Eye Near:    Bilateral Near:     Physical Exam Constitutional:      General: He is not in acute distress. HENT:     Head: Normocephalic and atraumatic.  Eyes:     General: No scleral icterus.    Pupils: Pupils are equal, round, and reactive to light.  Cardiovascular:     Rate and Rhythm: Normal rate.     Pulses: Normal pulses.  Pulmonary:     Effort: Pulmonary effort is normal. No respiratory distress.     Breath sounds: No  wheezing.  Musculoskeletal:     Comments: Patient with mild swelling in right ankle as compared to left.  Does have anterior and lateral ankle tenderness that spares malleoli.  Negative squeeze test, negative Homans test.  Legs symmetric  Skin:    Capillary Refill: Capillary refill takes less than 2 seconds.     Coloration: Skin is not jaundiced or pale.  Neurological:     Mental Status: He is alert and oriented to person, place, and time.     Motor: No weakness.     Gait: Gait normal.     Deep Tendon Reflexes: Reflexes normal.      UC Treatments / Results  Labs (all labs ordered are listed, but only abnormal results are displayed) Labs Reviewed - No data to display  EKG   Radiology No results found.  Procedures Procedures (including critical care time)  Medications Ordered in UC Medications - No data to display  Initial Impression / Assessment and Plan / UC Course  I have reviewed the triage vital signs and the nursing notes.  Pertinent labs & imaging results that were available during my care of the patient were reviewed by me and considered in my medical decision making (see chart for details).     H&P consistent with right ankle tendinitis.  Given ASO brace in office which he tolerated well.  Provided contact information for sports medicine if needed.  Return precautions discussed, patient verbalized understanding and is agreeable to plan. Final Clinical Impressions(s) / UC Diagnoses   Final diagnoses:  Right ankle tendonitis     Discharge Instructions     Recommend RICE: rest, ice, compression, elevation as needed for pain.    Heat therapy (hot compress, warm wash rag, hot showers, etc.) can help relax muscles and soothe muscle aches. Cold therapy (ice packs) can be used to help swelling both after injury and after prolonged use of areas of chronic pain/aches.  For pain: recommend 350 mg-1000 mg of Tylenol (acetaminophen) and/or 200 mg - 800 mg of Advil  (ibuprofen, Motrin) every 8 hours as needed.  May alternate between the two throughout the day as they are generally safe to take together.  DO NOT exceed more than 3000 mg of Tylenol or 3200 mg of ibuprofen in a  24 hour period as this could damage your stomach, kidneys, liver, or increase your bleeding risk.    ED Prescriptions    None     PDMP not reviewed this encounter.   Hall-Potvin, [place], [place] 03/25/20 1708

## 2020-03-25 NOTE — ED Triage Notes (Signed)
Pt here for bilateral ankle pain after working on Friday; pt sts left resolved but still having swelling in right

## 2020-05-01 DIAGNOSIS — L218 Other seborrheic dermatitis: Secondary | ICD-10-CM | POA: Diagnosis not present

## 2020-05-01 DIAGNOSIS — L820 Inflamed seborrheic keratosis: Secondary | ICD-10-CM | POA: Diagnosis not present

## 2020-07-27 NOTE — Progress Notes (Signed)
Subjective:    Patient ID: Evan Burns, male    DOB: 28-Dec-1980, 39 y.o.   MRN: [id number]  HPI The patient is here for an acute visit.  We will also follow-up on his chronic medical problems.   He has right groin pain.  He feels with movement.  He denies injury or activity that may have caused.  It started about three months.  No pain with cough or straining.  No bulges.    B/l leg pain from knees to ankles.  Radiates to ankle. It occurs at rest - will feel it first thing in the morning.  Worse with movement.  He has had some swelling in his ankles.  This started about one month ago.  At the gym he is doing a different leg routine - more leg presses and leg extensions - he started that about 2 months ago.  He was unsure if it was related to that or not.  In June he had right ankle pain and swelling - he went to urgent care and was dx with ankle tendinitis.  An ASO brace and rest helped.      He is taking his medications daily as prescribed.  He knows he needs to change his eating habits and is trying to lose some weight.  Medications and allergies reviewed with patient and updated if appropriate.  Patient Active Problem List   Diagnosis Date Noted  . Prediabetes 03/19/2020  . Subcutaneous mass of finger of left hand 07/27/2019  . Nodule of skin of finger of left hand 07/12/2019  . Chest mass 02/14/2019  . Other chest pain 02/14/2019  . Superficial phlebitis, chest wall 02/14/2019  . Fatigue 11/22/2018  . Essential hypertension 10/12/2010  . Dermatomyositis ([facility]) 06/17/2008  . Asthma 02/22/2007    Current Outpatient Medications on File Prior to Visit  Medication Sig Dispense Refill  . albuterol (PROVENTIL HFA;VENTOLIN HFA) 108 (90 Base) MCG/ACT inhaler Inhale 2 puffs into the lungs every 6 (six) hours as needed for wheezing or shortness of breath. 18 g 1  . amLODipine (NORVASC) 10 MG tablet Take 1 tablet (10 mg total) by mouth daily. 90 tablet 1  . budesonide-formoterol  (SYMBICORT) 160-4.5 MCG/ACT inhaler Inhale 2 puffs into the lungs 2 (two) times daily. 1 Inhaler 2  . COD LIVER OIL PO Take 1 capsule by mouth daily.    . Creatine POWD Take 1 Scoop by mouth.    . hydrocortisone 2.5 % cream Apply topically.    Marland Kitchen losartan (COZAAR) 100 MG tablet take 1 tablet by mouth once daily. 90 tablet 1  . Omega-3 Fatty Acids (FISH OIL) 1000 MG CAPS Take 1,000 mg by mouth daily.      No current facility-administered medications on file prior to visit.    Past Medical History:  Diagnosis Date  . Asthma   . Dermatomyositis ([facility])   . Hypertension     Past Surgical History:  Procedure Laterality Date  . HAND SURGERY    . INCISION AND DRAINAGE OF WOUND    . MASS EXCISION Left 07/27/2019   Procedure: LEFT LONG FINGER NODULE EXCISION;  Surgeon: Tarry Kos, MD;  [facility];  Service: Orthopedics;  Laterality: Left;  . TYMPANOSTOMY TUBE PLACEMENT      Social History   Socioeconomic History  . Marital status: Married    Spouse name: Not on file  . Number of children: Not on file  . Years of education: Not on  file  . Highest education level: Not on file  Occupational History  . Not on file  Tobacco Use  . Smoking status: Never Smoker  . Smokeless tobacco: Never Used  Substance and Sexual Activity  . Alcohol use: No  . Drug use: No  . Sexual activity: Not on file  Other Topics Concern  . Not on file  Social History Narrative   Pt lives at home w/ parents       Very active at work - works two jobs   Social Determinants of Corporate investment banker Strain:   . Difficulty of Paying Living Expenses: Not on file  Food Insecurity:   . Worried About Programme researcher, broadcasting/film/video in the Last Year: Not on file  . Ran Out of Food in the Last Year: Not on file  Transportation Needs:   . Lack of Transportation (Medical): Not on file  . Lack of Transportation (Non-Medical): Not on file  Physical Activity:   . Days of Exercise per Week: Not on  file  . Minutes of Exercise per Session: Not on file  Stress:   . Feeling of Stress : Not on file  Social Connections:   . Frequency of Communication with Friends and Family: Not on file  . Frequency of Social Gatherings with Friends and Family: Not on file  . Attends Religious Services: Not on file  . Active Member of Clubs or Organizations: Not on file  . Attends Banker Meetings: Not on file  . Marital Status: Not on file    Family History  Problem Relation Age of Onset  . Hypertension Mother   . Diabetes Mother   . Kidney disease Mother   . Hyperlipidemia Mother   . Hypertension Father   . Hyperlipidemia Father     Review of Systems  Constitutional: Negative for chills and fever.  Respiratory: Positive for cough (allergy / asthma). Negative for shortness of breath and wheezing.   Cardiovascular: Negative for chest pain, palpitations and leg swelling.  Musculoskeletal: Positive for arthralgias. Negative for joint swelling and myalgias.  Skin: Positive for rash (red, itching in groin area).  Neurological: Positive for headaches. Negative for dizziness and light-headedness. Facial asymmetry: occ.       Objective:   Vitals:   07/28/20 0839  BP: 138/90  Pulse: 83  Temp: 98.2 F (36.8 C)  SpO2: 98%   BP Readings from Last 3 Encounters:  07/28/20 138/90  03/25/20 (!) 149/86  03/18/20 (!) 158/100   Wt Readings from Last 3 Encounters:  07/28/20 252 lb (114.3 kg)  03/18/20 235 lb (106.6 kg)  07/27/19 233 lb 11 oz (106 kg)   Body mass index is 39.47 kg/m.   Physical Exam    Constitutional: Appears well-developed and well-nourished. No distress.  Head: Normocephalic and atraumatic.  Neck: Neck supple. No tracheal deviation present. No thyromegaly present.  No cervical lymphadenopathy Cardiovascular: Normal rate, regular rhythm and normal heart sounds.  No murmur heard. No carotid bruit .  No edema Pulmonary/Chest: Effort normal and breath sounds  normal. No respiratory distress. No has no wheezes. No rales.  MSK:  No joint swelling, no muscle tenderness, no knee crepitus, FROM of b/l knee and ankles Skin: Skin is warm and dry. Not diaphoretic. deferred looking at groin area. Psychiatric: Normal mood and affect. Behavior is normal.      Assessment & Plan:    See Problem List for Assessment and Plan of chronic medical problems.  This visit occurred during the SARS-CoV-2 public health emergency.  Safety protocols were in place, including screening questions prior to the visit, additional usage of staff PPE, and extensive cleaning of exam room while observing appropriate contact time as indicated for disinfecting solutions.

## 2020-07-28 ENCOUNTER — Encounter: Payer: Self-pay | Admitting: Internal Medicine

## 2020-07-28 ENCOUNTER — Ambulatory Visit (INDEPENDENT_AMBULATORY_CARE_PROVIDER_SITE_OTHER): Payer: Federal, State, Local not specified - PPO | Admitting: Internal Medicine

## 2020-07-28 ENCOUNTER — Other Ambulatory Visit: Payer: Self-pay

## 2020-07-28 VITALS — BP 138/90 | HR 83 | Temp 98.2°F | Ht 67.0 in | Wt 252.0 lb

## 2020-07-28 DIAGNOSIS — R1031 Right lower quadrant pain: Secondary | ICD-10-CM | POA: Insufficient documentation

## 2020-07-28 DIAGNOSIS — B356 Tinea cruris: Secondary | ICD-10-CM | POA: Insufficient documentation

## 2020-07-28 DIAGNOSIS — M79605 Pain in left leg: Secondary | ICD-10-CM

## 2020-07-28 DIAGNOSIS — R7303 Prediabetes: Secondary | ICD-10-CM

## 2020-07-28 DIAGNOSIS — J4520 Mild intermittent asthma, uncomplicated: Secondary | ICD-10-CM

## 2020-07-28 DIAGNOSIS — M79604 Pain in right leg: Secondary | ICD-10-CM | POA: Insufficient documentation

## 2020-07-28 DIAGNOSIS — I10 Essential (primary) hypertension: Secondary | ICD-10-CM

## 2020-07-28 DIAGNOSIS — Z23 Encounter for immunization: Secondary | ICD-10-CM | POA: Diagnosis not present

## 2020-07-28 LAB — HEMOGLOBIN A1C: Hgb A1c MFr Bld: 5.8 % (ref 4.6–6.5)

## 2020-07-28 LAB — COMPREHENSIVE METABOLIC PANEL
ALT: 31 U/L (ref 0–53)
AST: 22 U/L (ref 0–37)
Albumin: 4.1 g/dL (ref 3.5–5.2)
Alkaline Phosphatase: 41 U/L (ref 39–117)
BUN: 17 mg/dL (ref 6–23)
CO2: 26 mEq/L (ref 19–32)
Calcium: 8.7 mg/dL (ref 8.4–10.5)
Chloride: 106 mEq/L (ref 96–112)
Creatinine, Ser: 0.97 mg/dL (ref 0.40–1.50)
GFR: 104.53 mL/min (ref >60.00)
Glucose, Bld: 101 mg/dL — ABNORMAL HIGH (ref 70–99)
Potassium: 3.9 mEq/L (ref 3.5–5.1)
Sodium: 138 mEq/L (ref 135–145)
Total Bilirubin: 0.3 mg/dL (ref 0.2–1.2)
Total Protein: 6.6 g/dL (ref 6.0–8.3)

## 2020-07-28 MED ORDER — ALBUTEROL SULFATE HFA 108 (90 BASE) MCG/ACT IN AERS: 2.0000 | INHALATION_SPRAY | Freq: Four times a day (QID) | RESPIRATORY_TRACT | 1 refills | Status: DC | PRN

## 2020-07-28 MED ORDER — AMLODIPINE BESYLATE 10 MG PO TABS: 10.0000 mg | ORAL_TABLET | Freq: Every day | ORAL | 1 refills | Status: DC

## 2020-07-28 MED ORDER — BUTENAFINE HCL 1 % EX CREA: TOPICAL_CREAM | CUTANEOUS | 0 refills | Status: AC

## 2020-07-28 MED ORDER — BUDESONIDE-FORMOTEROL FUMARATE 160-4.5 MCG/ACT IN AERO: 2.0000 | INHALATION_SPRAY | Freq: Two times a day (BID) | RESPIRATORY_TRACT | 8 refills | Status: DC

## 2020-07-28 MED ORDER — LOSARTAN POTASSIUM 100 MG PO TABS: ORAL_TABLET | ORAL | 1 refills | Status: DC

## 2020-07-28 NOTE — Assessment & Plan Note (Signed)
Acute Likely fungal in nature Butenafine cream BID

## 2020-07-28 NOTE — Patient Instructions (Addendum)
  Blood work was ordered.     Medications reviewed and updated.  Changes include :   Cream for rash  Your prescription(s) have been submitted to your pharmacy. Please take as directed and contact our office if you believe you are having problem(s) with the medication(s).  A referral was ordered for sports medicine.       Someone from their office will call you to schedule an appointment.    Please followup in 6 months

## 2020-07-28 NOTE — Addendum Note (Signed)
Addended by: Karma Ganja on: 07/28/2020 04:51 PM   Modules accepted: Orders

## 2020-07-28 NOTE — Assessment & Plan Note (Signed)
Chronic Mild, intermittent Takes Symbicort as needed only and albuterol as needed Overall well controlled Continue

## 2020-07-28 NOTE — Assessment & Plan Note (Signed)
Chronic Check a1c Low sugar / carb diet Stressed regular exercise Work on weight loss  

## 2020-07-28 NOTE — Assessment & Plan Note (Signed)
Chronic Blood pressure borderline high and ideally should be lower Continue amlodipine 10 mg daily, losartan 100 mg daily Stressed weight loss He will continue regular exercise He is currently trying to decrease carbohydrates and portions CMP

## 2020-07-28 NOTE — Assessment & Plan Note (Signed)
Acute ? Strain I do not think this is related to his dermatomyositis Will refer to sports med Advised to revise activities

## 2020-07-28 NOTE — Assessment & Plan Note (Signed)
Acute ? Strain I do not think this is related to his dermatomyositis Will refer to sports med Advised to change in work out routine

## 2020-07-30 MED ORDER — ALBUTEROL SULFATE HFA 108 (90 BASE) MCG/ACT IN AERS: 2.0000 | INHALATION_SPRAY | Freq: Four times a day (QID) | RESPIRATORY_TRACT | 1 refills | Status: DC | PRN

## 2020-07-30 NOTE — Progress Notes (Signed)
Resent to pof../lmb 

## 2020-07-30 NOTE — Addendum Note (Signed)
Addended by: Deatra James on: 07/30/2020 08:59 AM   Modules accepted: Orders

## 2020-08-12 ENCOUNTER — Ambulatory Visit
Admission: EM | Admit: 2020-08-12 | Discharge: 2020-08-12 | Disposition: A | Discharge status: Home / Self Care | Payer: Federal, State, Local not specified - PPO | Attending: Internal Medicine | Admitting: Internal Medicine

## 2020-08-12 ENCOUNTER — Other Ambulatory Visit: Payer: Self-pay

## 2020-08-12 ENCOUNTER — Ambulatory Visit (INDEPENDENT_AMBULATORY_CARE_PROVIDER_SITE_OTHER): Payer: Federal, State, Local not specified - PPO

## 2020-08-12 ENCOUNTER — Encounter: Payer: Self-pay | Admitting: Emergency Medicine

## 2020-08-12 DIAGNOSIS — S4991XA Unspecified injury of right shoulder and upper arm, initial encounter: Secondary | ICD-10-CM | POA: Diagnosis not present

## 2020-08-12 DIAGNOSIS — M25511 Pain in right shoulder: Secondary | ICD-10-CM

## 2020-08-12 DIAGNOSIS — M25611 Stiffness of right shoulder, not elsewhere classified: Secondary | ICD-10-CM | POA: Diagnosis not present

## 2020-08-12 DIAGNOSIS — Y93B1 Activity, exercise machines primarily for muscle strengthening: Secondary | ICD-10-CM

## 2020-08-12 DIAGNOSIS — S46911A Strain of unspecified muscle, fascia and tendon at shoulder and upper arm level, right arm, initial encounter: Secondary | ICD-10-CM

## 2020-08-12 MED ORDER — NAPROXEN 500 MG PO TABS: 500.0000 mg | ORAL_TABLET | Freq: Two times a day (BID) | ORAL | 0 refills | Status: DC

## 2020-08-12 NOTE — ED Triage Notes (Signed)
Pt here for right shoulder pain after injuring while working out of Friday; pt sts continued pain

## 2020-08-12 NOTE — ED Provider Notes (Addendum)
[name]    CSN: [id number] Arrival date & time: 08/12/20  1602      History   Chief Complaint Chief Complaint  Patient presents with  . Shoulder Pain    HPI Evan Burns is a 39 y.o. male  Right anterior shoulder pain.  States this occurred while doing shoulder press at the gym on Friday.  No numbness, deformity, weakness.  Denies neck pain, impact trauma.  Has taken Tylenol, applied heat with minimal relief.  Past Medical History:  Diagnosis Date  . Asthma   . Dermatomyositis (HCC)   . Hypertension     Patient Active Problem List   Diagnosis Date Noted  . Tinea cruris 07/28/2020  . Right groin pain 07/28/2020  . Leg pain, bilateral 07/28/2020  . Prediabetes 03/19/2020  . Subcutaneous mass of finger of left hand 07/27/2019  . Nodule of skin of finger of left hand 07/12/2019  . Chest mass 02/14/2019  . Other chest pain 02/14/2019  . Superficial phlebitis, chest wall 02/14/2019  . Fatigue 11/22/2018  . Essential hypertension 10/12/2010  . Dermatomyositis (HCC) 06/17/2008  . Asthma 02/22/2007    Past Surgical History:  Procedure Laterality Date  . HAND SURGERY    . INCISION AND DRAINAGE OF WOUND    . MASS EXCISION Left 07/27/2019   Procedure: LEFT LONG FINGER NODULE EXCISION;  Surgeon: Tarry Kos, MD;  Location: [facility];  Service: Orthopedics;  Laterality: Left;  . TYMPANOSTOMY TUBE PLACEMENT         Home Medications    Prior to Admission medications   Medication Sig Start Date End Date Taking? Authorizing Provider  albuterol (VENTOLIN HFA) 108 (90 Base) MCG/ACT inhaler Inhale 2 puffs into the lungs every 6 (six) hours as needed for wheezing or shortness of breath. 07/30/20   [name], MD  amLODipine (NORVASC) 10 MG tablet Take 1 tablet (10 mg total) by mouth daily. 07/28/20   [name], MD  budesonide-formoterol (SYMBICORT) 160-4.5 MCG/ACT inhaler Inhale 2 puffs into the lungs 2 (two) times daily. 07/28/20    [name], MD  Butenafine HCl 1 % cream Apply twice daily to affected area 07/28/20   [name], MD  COD LIVER OIL PO Take 1 capsule by mouth daily.    [provider]  Creatine POWD Take 1 Scoop by mouth.    [provider]  hydrocortisone 2.5 % cream Apply topically. 05/01/20   [provider]  losartan (COZAAR) 100 MG tablet take 1 tablet by mouth once daily. 07/28/20   [name], MD  naproxen (NAPROSYN) 500 MG tablet Take 1 tablet (500 mg total) by mouth 2 (two) times daily. 08/12/20   Hall-Potvin, [place], PA-C  Omega-3 Fatty Acids (FISH OIL) 1000 MG CAPS Take 1,000 mg by mouth daily.     [provider]    Family History Family History  Problem Relation Age of Onset  . Hypertension Mother   . Diabetes Mother   . Kidney disease Mother   . Hyperlipidemia Mother   . Hypertension Father   . Hyperlipidemia Father     Social History Social History   Tobacco Use  . Smoking status: Never Smoker  . Smokeless tobacco: Never Used  Substance Use Topics  . Alcohol use: No  . Drug use: No     Allergies   Sulfonamide derivatives, Cephalexin, and Lisinopril   Review of Systems As per HPI   Physical Exam Triage  Vital Signs ED Triage Vitals  Enc Vitals Group     BP 08/12/20 1724 (!) 177/63     Pulse Rate 08/12/20 1724 86     Resp 08/12/20 1724 18     Temp 08/12/20 1724 98.3 F (36.8 C)     Temp Source 08/12/20 1724 Oral     SpO2 --      Weight --      Height --      Head Circumference --      Peak Flow --      Pain Score 08/12/20 1725 6     Pain Loc --      Pain Edu? --      Excl. in GC? --    No data found.  Updated Vital Signs BP (!) 177/63 (BP Location: Right Arm)   Pulse 86   Temp 98.3 F (36.8 C) (Oral)   Resp 18   Visual Acuity Right Eye Distance:   Left Eye Distance:   Bilateral Distance:    Right Eye Near:   Left Eye Near:    Bilateral Near:     Physical Exam Constitutional:      General: He  is not in acute distress. HENT:     Head: Normocephalic and atraumatic.  Eyes:     General: No scleral icterus.    Pupils: Pupils are equal, round, and reactive to light.  Cardiovascular:     Rate and Rhythm: Normal rate.  Pulmonary:     Effort: Pulmonary effort is normal. No respiratory distress.     Breath sounds: No wheezing.  Musculoskeletal:        General: Tenderness present. No swelling. Normal range of motion.     Comments: Right shoulder with AC joint tenderness as compared to left.  Otherwise unremarkable.  Neurovascular intact.  Skin:    Coloration: Skin is not jaundiced or pale.  Neurological:     Mental Status: He is alert and oriented to person, place, and time.      UC Treatments / Results  Labs (all labs ordered are listed, but only abnormal results are displayed) Labs Reviewed - No data to display  EKG   Radiology DG Shoulder Right  Result Date: 08/12/2020 CLINICAL DATA:  Injured 1 month ago with right shoulder region pain. EXAM: RIGHT SHOULDER - 2+ VIEW COMPARISON:  None. FINDINGS: There is no evidence of fracture or dislocation. There is no evidence of arthropathy or other focal bone abnormality. Soft tissues are unremarkable. IMPRESSION: Negative. Electronically Signed   By: Paulina Fusi M.D.   On: 08/12/2020 18:17    Procedures Procedures (including critical care time)  Medications Ordered in UC Medications - No data to display  Initial Impression / Assessment and Plan / UC Course  I have reviewed the triage vital signs and the nursing notes.  Pertinent labs & imaging results that were available during my care of the patient were reviewed by me and considered in my medical decision making (see chart for details).     X-ray negative, will treat for likely MSK VS AC tendinitis as below.  Provided sports medicine contact information, the patient states he already has an appointment next week.  Return precautions discussed, pt verbalized  understanding and is agreeable to plan. Final Clinical Impressions(s) / UC Diagnoses   Final diagnoses:  Shoulder strain, right, initial encounter     Discharge Instructions     Cold therapy (ice packs) can be used to help swelling both after  injury and after prolonged use of areas of chronic pain/aches.  Pain medication:  500 mg Naprosyn/Aleve (naproxen) every 12 hours with food:  AVOID other NSAIDs while taking this (may have Tylenol).  Important to follow up with specialist(s) below for further evaluation/management if your symptoms persist or worsen.    ED Prescriptions    Medication Sig Dispense Auth. Provider   naproxen (NAPROSYN) 500 MG tablet Take 1 tablet (500 mg total) by mouth 2 (two) times daily. 30 tablet Hall-Potvin, [place], PA-C     I have reviewed the PDMP during this encounter.     Odette Fraction [place], [place] 08/12/20 0045

## 2020-08-12 NOTE — Discharge Instructions (Signed)
Cold therapy (ice packs) can be used to help swelling both after injury and after prolonged use of areas of chronic pain/aches.  Pain medication:  500 mg Naprosyn/Aleve (naproxen) every 12 hours with food:  AVOID other NSAIDs while taking this (may have Tylenol).  Important to follow up with specialist(s) below for further evaluation/management if your symptoms persist or worsen.

## 2020-08-19 ENCOUNTER — Encounter: Payer: Self-pay | Admitting: Family Medicine

## 2020-08-19 ENCOUNTER — Ambulatory Visit: Payer: Self-pay

## 2020-08-19 ENCOUNTER — Other Ambulatory Visit: Payer: Self-pay

## 2020-08-19 ENCOUNTER — Ambulatory Visit: Payer: Federal, State, Local not specified - PPO | Admitting: Family Medicine

## 2020-08-19 ENCOUNTER — Ambulatory Visit (INDEPENDENT_AMBULATORY_CARE_PROVIDER_SITE_OTHER): Payer: Federal, State, Local not specified - PPO

## 2020-08-19 VITALS — BP 118/80 | HR 85 | Ht 67.0 in | Wt 249.8 lb

## 2020-08-19 DIAGNOSIS — M25511 Pain in right shoulder: Secondary | ICD-10-CM

## 2020-08-19 DIAGNOSIS — M79662 Pain in left lower leg: Secondary | ICD-10-CM

## 2020-08-19 DIAGNOSIS — M3390 Dermatopolymyositis, unspecified, organ involvement unspecified: Secondary | ICD-10-CM

## 2020-08-19 DIAGNOSIS — R7303 Prediabetes: Secondary | ICD-10-CM

## 2020-08-19 DIAGNOSIS — R1031 Right lower quadrant pain: Secondary | ICD-10-CM

## 2020-08-19 DIAGNOSIS — M1611 Unilateral primary osteoarthritis, right hip: Secondary | ICD-10-CM | POA: Diagnosis not present

## 2020-08-19 DIAGNOSIS — M79661 Pain in right lower leg: Secondary | ICD-10-CM

## 2020-08-19 DIAGNOSIS — G8929 Other chronic pain: Secondary | ICD-10-CM

## 2020-08-19 LAB — CBC WITH DIFFERENTIAL/PLATELET
Basophils Absolute: 0.1 10*3/uL (ref 0.0–0.1)
Basophils Relative: 2.5 % (ref 0.0–3.0)
Eosinophils Absolute: 0.1 10*3/uL (ref 0.0–0.7)
Eosinophils Relative: 3.8 % (ref 0.0–5.0)
HCT: 42.1 % (ref 39.0–52.0)
Hemoglobin: 13.9 g/dL (ref 13.0–17.0)
Lymphocytes Relative: 34.9 % (ref 12.0–46.0)
Lymphs Abs: 1.4 10*3/uL (ref 0.7–4.0)
MCHC: 33 g/dL (ref 30.0–36.0)
MCV: 81.8 fl (ref 78.0–100.0)
Monocytes Absolute: 0.3 10*3/uL (ref 0.1–1.0)
Monocytes Relative: 7.3 % (ref 3.0–12.0)
Neutro Abs: 2 10*3/uL (ref 1.4–7.7)
Neutrophils Relative %: 51.5 % (ref 43.0–77.0)
Platelets: 232 10*3/uL (ref 150.0–400.0)
RBC: 5.15 Mil/uL (ref 4.22–5.81)
RDW: 13.1 % (ref 11.5–15.5)
WBC: 3.9 10*3/uL — ABNORMAL LOW (ref 4.0–10.5)

## 2020-08-19 LAB — SEDIMENTATION RATE: Sed Rate: 9 mm/hr (ref 0–15)

## 2020-08-19 LAB — CK: Total CK: 435 U/L — ABNORMAL HIGH (ref 7–232)

## 2020-08-19 NOTE — Patient Instructions (Addendum)
Thank you for coming in today.  Please get labs today before you leave  Please get an Xray today before you leave  I've referred you to Physical Therapy.  Let us know if you don't hear from them in one week.  Recheck in 4 weeks.  There likely will be more to do.    Dermatomyositis Dermatomyositis is a rare muscle disease that causes muscle weakness and a rash. It usually affects muscles that are closest to the trunk of your body (torso). You may have a combination of muscle weakness, pain, and swelling. This condition can make it hard to rise from a sitting position, climb stairs, lift objects, or reach over your head. What are the causes? The cause of this condition is not known. What increases the risk? You are more likely to develop this condition if:  You are male.  You have cancer.  You have lung disease. What are the signs or symptoms? Symptoms of this condition may include:  A bluish-purple rash that may be itchy. This often develops before the muscle weakness. It appears on the face, neck, shoulders, upper chest, elbows, knees, knuckles, and back.  Swelling of the face and eyelids.  Hardened bumps of calcium deposits under your skin (especially in children).  Trouble swallowing, talking, and breathing.  Muscle aches and tenderness.  Fatigue.  Weight loss.  Low fever.  Open wounds (ulcers).  Heartburn from changes in muscles of the esophagus.  Muscle weakness, swelling, and pain. How is this diagnosed? This condition may be diagnosed based on:  Blood tests.  A test to check whether your muscles are responding properly to electrical signals from your nerves (electromyogram).  Removal of a small tissue sample from a weakened muscle to be examined under a microscope (muscle biopsy).  Removal of a small skin sample from an area with a rash to be examined under a microscope (skin biopsy).  Chest X-ray.  MRI. How is this treated? This condition may be  treated with:  Medicines that weaken and decrease the activity of your body's disease-fighting system (immune system) and reduce inflammation (corticosteroids).  Medicine containing antibodies to help your body fight infections (immunoglobulin).  Medicines to reduce inflammation in your body.  Physical therapy and exercise to prevent muscle loss (atrophy). Follow these instructions at home:  Wear protective clothing and high-protection sunscreen when you go outside. Areas that are affected by a rash are more sensitive to the sun.  Use skin creams or ointments as told by your health care provider.  Take over-the-counter and prescription medicines only as told by your health care provider.  If physical therapy was prescribed, do exercises as directed.  Do not eat right before bedtime. Raise (elevate) the head of your bed to decrease heartburn.  If directed by your health care provider, eat more protein in your diet. Contact a health care provider if:  Your condition gets worse.  You have unexplained muscle weakness.  You develop a new rash.  You have a fever and cough. Get help right away if:  You have difficulty swallowing.  You develop breathing problems. Summary  Dermatomyositis is a rare muscle disease that causes muscle weakness and a rash.  You may have a combination of muscle weakness, pain, and swelling.  Treatment may include medicines and physical therapy.  Wear protective clothing and high-protection sunscreen when you go outside. Areas that are affected by a rash are more sensitive to the sun. This information is not intended to replace advice  given to you by your health care provider. Make sure you discuss any questions you have with your health care provider. Document Revised: 09/23/2017 Document Reviewed: 11/02/2016 Elsevier Patient Education  2020 [employer].

## 2020-08-19 NOTE — Progress Notes (Signed)
Subjective:    I'm seeing this patient as a consultation for:  Dr. Lawerance Bach. Note will be routed back to referring provider/PCP.  CC: B lower leg pain and R groin pain  I, Molly Weber, LAT, ATC, am serving as scribe for Dr. Clementeen Graham.  HPI: Pt is a 39 y/o male presenting w/ c/o B lower leg pain from his knees to ankles and R groin pain.  R groin pain: Present x approximately 3 months w/ no known MOI. -Radiating pain: No -Swelling: No -Aggravating factors: walking; certain loaded movements; R hip flexion AROM -Treatments tried: Nothing  B lower leg pain: Symptoms x 6 weeks w/ no known MOI.  He states that he changed his workouts at the gym for his lower body -Numbness/tingling: No -Aggravating factors: Unknown; Intermittent -Treatments tried: Nothing  Right shoulder pain: Ongoing for a few weeks.  Seen in emergency room on October 19 where x-rays were normal.  Advised to follow-up with sports medicine for this.  No radiating pain weakness or numbness distally.  Pain with overhead motion and reaching back to present.  Patient is an active weightlifter and has modified his weightlifting to accommodate his shoulder pain and leg pain which has helped quite a bit.  Past medical history, Surgical history, Family history, Social history, Allergies, and medications have been entered into the medical record, reviewed.  Significant for dermatomyositis as a child.  Not on any active treatment currently.  Is been quite a while since he saw a rheumatologist for this.  History of steroid use due to dermatomyositis in the past.  Review of Systems: No new headache, visual changes, nausea, vomiting, diarrhea, constipation, dizziness, abdominal pain, skin rash, fevers, chills, night sweats, weight loss, swollen lymph nodes, body aches, joint swelling, muscle aches, chest pain, shortness of breath, mood changes, visual or auditory hallucinations.   Objective:    Vitals:   08/19/20 0933  BP: 118/80    Pulse: 85  SpO2: 97%   General: Well Developed, well nourished, and in no acute distress.  Neuro/Psych: Alert and oriented x3, extra-ocular muscles intact, able to move all 4 extremities, sensation grossly intact. Skin: Warm and dry, maculopapular rash across face and upper and lower extremities.  Significant nontender firm nodules cutaneous upper extremities bilaterally. Respiratory: Not using accessory muscles, speaking in full sentences, trachea midline.  Cardiovascular: Pulses palpable, no extremity edema. Abdomen: Does not appear distended. MSK:  Right shoulder well-developed musculature normal-appearing Range of motion abduction full external rotation full internal rotation lumbar spine. Some pain with abduction present. Strength intact abduction external and internal rotation. Mildly positive Hawkins and Neer's test.  Negative Yergason's and speeds test. Mildly positive empty can test.   Right hip normal-appearing nontender. Range of motion lacks full flexion and internal rotation as this does reproduce pain. Strength intact some pain with resisted hip flexion.  Bilateral lower extremities otherwise normal-appearing nontender muscle groups.  Normal knee motion and strength.   Lab and Radiology Results DG Shoulder Right  Result Date: 08/12/2020 CLINICAL DATA:  Injured 1 month ago with right shoulder region pain. EXAM: RIGHT SHOULDER - 2+ VIEW COMPARISON:  None. FINDINGS: There is no evidence of fracture or dislocation. There is no evidence of arthropathy or other focal bone abnormality. Soft tissues are unremarkable. IMPRESSION: Negative. Electronically Signed   By: Paulina Fusi M.D.   On: 08/12/2020 18:17   I, Clementeen Graham, personally (independently) visualized and performed the interpretation of the images attached in this note.  X-ray images  right hip obtained today personally and independently interpreted. Mild to moderate DJD with femoral acetabular impingement pattern.   Concern for AVN without collapse Await for radiology review  Diagnostic Limited MSK Ultrasound of: Right shoulder Biceps tendon in bicipital groove.  However significant hypoechoic fluid surrounds bicep tendon and tendon sheath.  Tendon is intact and nontender with ultrasound pressure. Subscapularis tendon is intact. Supraspinatus tendon is intact. Moderate subacromial bursa fluid collection present.  Next infraspinatus tendon is intact. AC joint narrowed degenerative with effusion. Impression: Subacromial bursitis.  Significant fluid in biceps tendon sheath but not significant for biceps tendinitis.  This likely indicates intra-articular dysfunction or pain. AC DJD   Impression and Recommendations:    Assessment and Plan: 39 y.o. male with right shoulder pain, right anterior hip pain, and bilateral leg pain in the setting of dermatomyositis.  Shoulder pain thought to be due to rotator cuff tendinitis and subacromial impingement.  Patient has a moderate hypoechoic fluid tracking in biceps tendon sheath indicating more intra-articular issue.  Not sure if this relates to his dermatomyositis which will be addressed below. Plan for physical therapy and recheck in a month.  Right anterior hip pain: Concern for femoral acetabular impingement and DJD and possibly AVN on x-ray.  Await radiology overread.  Again physical therapy and if concern for AVN will proceed directly to MRI.  Leg pain: Much improved with a bit of time of modification of exercise.  Rheumatologic work-up below.  Dermatomyositis: Unusual history of dermatomyositis as a child.  This has not been evaluated recently.  Plan for limited rheumatologic work-up for this with sed rate ANA, CBC, CK.  Consider referral to rheumatology.  Recheck in 1 month.Marland Kitchen  PDMP not reviewed this encounter. Orders Placed This Encounter  Procedures  . [place] LIMITED JOINT SPACE STRUCTURES LOW BILAT(NO LINKED CHARGES)    Order Specific Question:   Reason  for Exam (SYMPTOM  OR DIAGNOSIS REQUIRED)    Answer:   B lower leg pain    Order Specific Question:   Preferred imaging location?    Answer:   Adult nurse [facility]  . DG HIP UNILAT WITH PELVIS 2-3 VIEWS RIGHT    Standing Status:   Future    Number of Occurrences:   1    Standing Expiration Date:   08/19/2021    Order Specific Question:   Reason for Exam (SYMPTOM  OR DIAGNOSIS REQUIRED)    Answer:   eval hip pain    Order Specific Question:   Preferred imaging location?    Answer:   [facility]  . Sedimentation rate    Standing Status:   Future    Number of Occurrences:   1    Standing Expiration Date:   08/19/2021  . ANA    Standing Status:   Future    Number of Occurrences:   1    Standing Expiration Date:   08/19/2021  . CK    Standing Status:   Future    Number of Occurrences:   1    Standing Expiration Date:   08/19/2021  . CBC with Differential/Platelet    Standing Status:   Future    Number of Occurrences:   1    Standing Expiration Date:   08/19/2021  . Ambulatory referral to Physical Therapy    Referral Priority:   Routine    Referral Type:   Physical Medicine    Referral Reason:   Specialty Services Required    Requested Specialty:  Physical Therapy   No orders of the defined types were placed in this encounter.   Discussed warning signs or symptoms. Please see discharge instructions. Patient expresses understanding.   The above documentation has been reviewed and is accurate and complete [name], M.D.

## 2020-08-20 NOTE — Progress Notes (Signed)
CK a lab for muscle breakdown is moderately elevated.  CBC and sedimentation rate are effectively normal.  Other lab is still pending.

## 2020-08-21 LAB — ANTI-NUCLEAR AB-TITER (ANA TITER): ANA Titer 1: 1:80 {titer} — ABNORMAL HIGH

## 2020-08-21 LAB — ANA: Anti Nuclear Antibody (ANA): POSITIVE — AB

## 2020-08-21 NOTE — Progress Notes (Signed)
X-ray right hip shows medium arthritis

## 2020-08-22 NOTE — Addendum Note (Signed)
Addended by: Rodolph Bong on: 08/22/2020 07:24 AM   Modules accepted: Orders

## 2020-08-22 NOTE — Progress Notes (Signed)
ANA lab for inflammation is positive.  I have placed a referral to rheumatology.  You should hear soon about the appointment

## 2020-08-27 ENCOUNTER — Encounter: Payer: Self-pay | Admitting: Internal Medicine

## 2020-09-15 ENCOUNTER — Encounter: Payer: Self-pay | Admitting: Rehabilitative and Restorative Service Providers"

## 2020-09-15 ENCOUNTER — Other Ambulatory Visit: Payer: Self-pay

## 2020-09-15 ENCOUNTER — Ambulatory Visit (INDEPENDENT_AMBULATORY_CARE_PROVIDER_SITE_OTHER): Payer: Federal, State, Local not specified - PPO | Admitting: Rehabilitative and Restorative Service Providers"

## 2020-09-15 DIAGNOSIS — M25651 Stiffness of right hip, not elsewhere classified: Secondary | ICD-10-CM | POA: Diagnosis not present

## 2020-09-15 DIAGNOSIS — M25551 Pain in right hip: Secondary | ICD-10-CM | POA: Diagnosis not present

## 2020-09-15 DIAGNOSIS — R262 Difficulty in walking, not elsewhere classified: Secondary | ICD-10-CM | POA: Diagnosis not present

## 2020-09-15 DIAGNOSIS — G8929 Other chronic pain: Secondary | ICD-10-CM

## 2020-09-15 DIAGNOSIS — M25511 Pain in right shoulder: Secondary | ICD-10-CM

## 2020-09-15 DIAGNOSIS — M25611 Stiffness of right shoulder, not elsewhere classified: Secondary | ICD-10-CM | POA: Diagnosis not present

## 2020-09-15 NOTE — Patient Instructions (Signed)
Access Code: [email or web address] URL: [email or web address] Date: 09/15/2020 Prepared by: Pauletta Browns  Exercises Supine Shoulder Internal Rotation Stretch - 2-3 x daily - 7 x weekly - 1 sets - 10-20 reps - 10 secondsinutes hold Supine Figure 4 Piriformis Stretch - 2-3 x daily - 7 x weekly - 1 sets - 5 reps - 20 seconds hold Standing Lumbar Extension at Wall - Forearms - 5 x daily - 7 x weekly - 1 sets - 5 reps - 3 seconds hold Kneeling Hip Flexor Stretch - 2-3 x daily - 7 x weekly - 1 sets - 5 reps - 20 seconds hold

## 2020-09-15 NOTE — Therapy (Signed)
[facility] Physical Therapy [address] [place], [place], [address] Phone: [phone]   Fax:  [phone]  Physical Therapy Evaluation  Patient Details  Name: Evan Burns MRN: [id number] Date of Birth: 07/15/1981 Referring Provider (PT): Rodolph Bong MD   Encounter Date: 09/15/2020   PT End of Session - 09/15/20 1157    Visit Number 1    Number of Visits 16    PT Start Time 0849    PT Stop Time 0932    PT Time Calculation (min) 43 min    Activity Tolerance Patient tolerated treatment well;No increased pain    Behavior During Therapy WFL for tasks assessed/performed           Past Medical History:  Diagnosis Date   Asthma    Dermatomyositis (HCC)    Hypertension     Past Surgical History:  Procedure Laterality Date   HAND SURGERY     INCISION AND DRAINAGE OF WOUND     MASS EXCISION Left 07/27/2019   Procedure: LEFT LONG FINGER NODULE EXCISION;  Surgeon: Tarry Kos, MD;  Location: [facility];  Service: Orthopedics;  Laterality: Left;   TYMPANOSTOMY TUBE PLACEMENT      There were no vitals filed for this visit.    Subjective Assessment - 09/15/20 1151    Subjective Evan Burns has had increasing R hip pain for several months.  R shoulder pain has been a few weeks.    Pertinent History Dermatomyositis, HTN    Patient Stated Goals Decrease R shoulder and hip pain    Currently in Pain? Yes    Pain Score 3     Pain Location Other (Comment)   Shoulder and hip (both R)   Pain Orientation Right    Pain Descriptors / Indicators Tightness;Sharp    Pain Type Chronic pain    Pain Onset More than a month ago    Pain Frequency Intermittent    Effect of Pain on Daily Activities R hip pain affects gait quality and endurance.  Shoulder pain affects reaching and overhead function.              [facility] PT Assessment - 09/15/20 0001      Assessment   Medical Diagnosis R groin and R shoulder pain    Referring Provider (PT)  Rodolph Bong MD    Onset Date/Surgical Date --   Chronic     Balance Screen   Has the patient fallen in the past 6 months No    Has the patient had a decrease in activity level because of a fear of falling?  No    Is the patient reluctant to leave their home because of a fear of falling?  No      Prior Function   Level of Independence Independent      Cognition   Overall Cognitive Status Within Functional Limits for tasks assessed      Observation/Other Assessments   Focus on Therapeutic Outcomes (FOTO)  67 (80 Goal)      ROM / Strength   AROM / PROM / Strength AROM;Strength      AROM   Overall AROM  Deficits    AROM Assessment Site Hip;Shoulder    Right/Left Shoulder Left;Right    Right Shoulder Flexion 160 Degrees    Right Shoulder Internal Rotation 30 Degrees    Right Shoulder External Rotation 80 Degrees    Right Shoulder Horizontal  ADduction 40 Degrees    Left Shoulder  Flexion 170 Degrees    Left Shoulder Internal Rotation 45 Degrees    Left Shoulder External Rotation 90 Degrees    Left Shoulder Horizontal ADduction 35 Degrees    Right/Left Hip Left;Right    Right Hip Flexion 95    Right Hip External Rotation  26    Right Hip Internal Rotation  5    Left Hip Flexion 95    Left Hip External Rotation  18    Left Hip Internal Rotation  12      Flexibility   Soft Tissue Assessment /Muscle Length yes    Hamstrings 50 degrees L/45 degrees R                      Objective measurements completed on examination: See above findings.       [facility] Adult PT Treatment/Exercise - 09/15/20 0001      Exercises   Exercises Shoulder;Knee/Hip      Knee/Hip Exercises: Stretches   Hip Flexor Stretch Both;4 reps;20 seconds    Hip Flexor Stretch Limitations 1 foot in chair, hop back with other foot, ball of foot, turn slightly toward foot in chair    Piriformis Stretch Both;4 reps;20 seconds   Figure 4 supine     Knee/Hip Exercises: Standing   Other Standing  Knee Exercises Standing trunk extension AROM 10X 3 seconds      Shoulder Exercises: Supine   Internal Rotation AROM;Right;10 reps   10 seconds                 PT Education - 09/15/20 1156    Education Details Discussed exam findings, prognosis (excellent shoulder and good/fair hip) and reviewed HEP.    Person(s) Educated Patient    Methods Explanation;Demonstration;Tactile cues;Verbal cues;Handout    Comprehension Verbalized understanding;Tactile cues required;Need further instruction;Returned demonstration;Verbal cues required               PT Long Term Goals - 09/15/20 1204      PT LONG TERM GOAL #1   Title Improve R shoulder and hip pain to consistently 0-3/10 on the Numeric Pain Rating Scale.    Baseline Can be 6-7/10    Time 8    Period Weeks    Status New    Target Date 11/21/20      PT LONG TERM GOAL #2   Title Improve FOTO score to 80.    Baseline 67    Time 8    Period Weeks    Status New    Target Date 11/21/20      PT LONG TERM GOAL #3   Title Improve R shoulder AROM for flexion to 170; ER to 90; IR to 70 and HA to 40 degrees.    Baseline See objective.    Time 8    Period Weeks    Status New    Target Date 11/21/20      PT LONG TERM GOAL #4   Title Improve R hip flexion to 100 degrees; hamstrings to 50 degrees; hip ER to 40 degrees and IR to 10 degrees.    Baseline See Objective.    Time 8    Period Weeks    Status New    Target Date 11/21/20      PT LONG TERM GOAL #5   Title Evan Burns will be independent with his HEP at DC.    Time 8    Period Weeks    Status New    Target  Date 11/21/20                  Plan - 09/15/20 1158    Clinical Impression Statement Evan Burns has a months long history of R hip pain with recent increase in R shoulder pain.  Shoulder appears to be an impingement with capsular tightness and mild ER RTC weakness.  R hip has moderate OA and will be a more challenging rehabilitation.  I expect 100% shoulder  function and improved hip function with supervised physical therapy although the hip will likely have some continuing long-term impairments.    Examination-Activity Limitations Dressing;Sit;Sleep;Bed Mobility;Bend;Squat;Stairs;Reach Overhead;Stand    Examination-Participation Restrictions Occupation;Community Activity    Stability/Clinical Decision Making Stable/Uncomplicated    Clinical Decision Making Low    Rehab Potential Good    PT Frequency 2x / week    PT Duration 8 weeks    PT Treatment/Interventions ADLs/Self Care Home Management;Cryotherapy;Therapeutic activities;Gait training;Therapeutic exercise;Neuromuscular re-education;Functional mobility training;Patient/family education;Manual techniques    PT Next Visit Plan Continue shoulder capsular stretching, add scapular and rotator cuff strengthening, progress hip extensors and abductors strengthening and add flexibility activities to address impairments noted on exam.    PT Home Exercise Plan [id number]    Consulted and Agree with Plan of Care Patient           Patient will benefit from skilled therapeutic intervention in order to improve the following deficits and impairments:  Abnormal gait, Decreased endurance, Decreased range of motion, Decreased mobility, Decreased strength, Difficulty walking, Hypomobility, Impaired flexibility, Impaired UE functional use, Pain  Visit Diagnosis: Difficulty walking  Stiffness of right hip, not elsewhere classified  Stiffness of right shoulder, not elsewhere classified  Chronic pain of right hip  Pain of right shoulder joint on movement     Problem List Patient Active Problem List   Diagnosis Date Noted   Tinea cruris 07/28/2020   Right groin pain 07/28/2020   Leg pain, bilateral 07/28/2020   Prediabetes 03/19/2020   Subcutaneous mass of finger of left hand 07/27/2019   Nodule of skin of finger of left hand 07/12/2019   Chest mass 02/14/2019   Other chest pain 02/14/2019     Superficial phlebitis, chest wall 02/14/2019   Fatigue 11/22/2018   Essential hypertension 10/12/2010   Dermatomyositis (HCC) 06/17/2008   Asthma 02/22/2007    Evan Burns PT, MPT 09/15/2020, 12:10 PM  [place] [facility] Physical Therapy [address] [place], [place], [address] Phone: [phone]   Fax:  [phone]  Name: Evan Burns MRN: [id number] Date of Birth: Mar 01, 1981

## 2020-09-15 NOTE — Progress Notes (Signed)
I, Evan Burns, LAT, ATC, am serving as scribe for Dr. [name].  Evan Burns is a 39 y.o. male who presents to [place] [facility] today for f/u of R groin pain and B lower leg pain.  He was last seen by Dr. Georgina Snell on 08/19/20 and noted R groin pain x 3 months and B lower leg pain x 6 weeks and was referred to PT of which he's completed one visit.  Since his last visit, pt reports that his R groin still bothers him.  He states that his R shoulder is feeling better w/ random bursts of pain.  His B lower legs have not been bothering him as much recently.  Pt states that his L ankle is also bothering him again.  This issue began in July when he was having difficulty weight bearing and had some L ankle swelling.  He does intermittently wear an ASO ankle brace and is interested in getting another one as his old brace is tearing.  He continues to have a little bit of left ankle pain.. . Patient was referred to rheumatology the last visit.  First appointment is scheduled for December 9.  Diagnostic testing: R hip XR- 08/19/20; R shoulder XR- 08/12/20  Pertinent review of systems: No fevers or chills  Relevant historical information: Dermatomyositis   Exam:  BP 130/82 (BP Location: Left Arm, Patient Position: Sitting, Cuff Size: Large)   Pulse 89   Ht '5\' 7"'  (1.702 m)   Wt 252 lb 6.4 oz (114.5 kg)   SpO2 97%   BMI 39.53 kg/m  General: Well Developed, well nourished, and in no acute distress.   MSK: Right shoulder normal-appearing nontender normal shoulder motion. Right hip decreased motion normal gait. Left ankle normal-appearing normal gait.    Lab and Radiology Results  DG Shoulder Right  Result Date: 08/12/2020 CLINICAL DATA:  Injured 1 month ago with right shoulder region pain. EXAM: RIGHT SHOULDER - 2+ VIEW COMPARISON:  None. FINDINGS: There is no evidence of fracture or dislocation. There is no evidence of arthropathy or other focal bone  abnormality. Soft tissues are unremarkable. IMPRESSION: Negative. Electronically Signed   By: Nelson Chimes M.D.   On: 08/12/2020 18:17   [place] LIMITED JOINT SPACE STRUCTURES LOW BILAT(NO LINKED CHARGES)  Result Date: 09/04/2020 Diagnostic Limited MSK Ultrasound of: Right shoulder Biceps tendon in bicipital groove.  However significant hypoechoic fluid surrounds bicep tendon and tendon sheath.  Tendon is intact and nontender with ultrasound pressure. Subscapularis tendon is intact. Supraspinatus tendon is intact. Moderate subacromial bursa fluid collection present.  Next infraspinatus tendon is intact. AC joint narrowed degenerative with effusion. Impression: Subacromial bursitis.  Significant fluid in biceps tendon sheath but not significant for biceps tendinitis.  This likely indicates intra-articular dysfunction or pain. AC DJD  DG HIP UNILAT WITH PELVIS 2-3 VIEWS RIGHT  Result Date: 08/21/2020 CLINICAL DATA:  Acute right hip pain without known injury. EXAM: DG HIP (WITH OR WITHOUT PELVIS) 2-3V RIGHT COMPARISON:  None. FINDINGS: There is no evidence of hip fracture or dislocation. Moderate narrowing and osteophyte formation of the right hip is noted. IMPRESSION: Moderate osteoarthritis of the right hip. No acute abnormality is noted. Electronically Signed   By: Marijo Conception M.D.   On: 08/21/2020 09:30   I, [name], personally (independently) visualized and performed the interpretation of the xray images attached in this note.   Recent Results (from the past 2160 hour(s))  Hemoglobin A1c  Status: None   Collection Time: 07/28/20  9:19 AM  Result Value Ref Range   Hgb A1c MFr Bld 5.8 4.6 - 6.5 %    Comment: Glycemic Control Guidelines for People with Diabetes:Non Diabetic:  <6%Goal of Therapy: <7%Additional Action Suggested:  >8%   Comp Met (CMET)     Status: Abnormal   Collection Time: 07/28/20  9:20 AM  Result Value Ref Range   Sodium 138 135 - 145 mEq/L   Potassium 3.9 3.5 - 5.1  mEq/L   Chloride 106 96 - 112 mEq/L   CO2 26 19 - 32 mEq/L   Glucose, Bld 101 (H) 70 - 99 mg/dL   BUN 17 6 - 23 mg/dL   Creatinine, Ser 0.97 0.40 - 1.50 mg/dL   Total Bilirubin 0.3 0.2 - 1.2 mg/dL   Alkaline Phosphatase 41 39 - 117 U/L   AST 22 0 - 37 U/L   ALT 31 0 - 53 U/L   Total Protein 6.6 6.0 - 8.3 g/dL   Albumin 4.1 3.5 - 5.2 g/dL   GFR 104.53 >60.00 mL/min   Calcium 8.7 8.4 - 10.5 mg/dL  ANA     Status: Abnormal   Collection Time: 08/19/20 10:04 AM  Result Value Ref Range   Anti Nuclear Antibody (ANA) POSITIVE (A) NEGATIVE    Comment: ANA IFA is a first line screen for detecting the presence of up to approximately 150 autoantibodies in various autoimmune diseases. A positive ANA IFA result is suggestive of autoimmune disease and reflexes to titer and pattern. Further laboratory testing may be considered if clinically indicated. . For additional information, please refer to http://education.[employer].com/faq/FAQ177 (This link is being provided for informational/ educational purposes only.) .   Anti-nuclear ab-titer (ANA titer)     Status: Abnormal   Collection Time: 08/19/20 10:04 AM  Result Value Ref Range   ANA Titer 1 1:80 (H) titer    Comment: A low level ANA titer may be present in pre-clinical autoimmune diseases and normal individuals.                 Reference Range                 <1:40        Negative                 1:40-1:80    Low Antibody Level                 >1:80        Elevated Antibody Level .    ANA Pattern 1 Nuclear, Speckled (A)     Comment: Speckled pattern is associated with mixed connective tissue disease (MCTD), systemic lupus erythematosus (SLE), Sjogren's syndrome, dermatomyositis, and  systemic sclerosis/polymyositis overlap. . AC-2,4,5,29: Speckled . International Consensus on ANA Patterns (https://www.hernandez-brewer.[email or web address]/)   CBC with Differential/Platelet     Status: Abnormal   Collection Time: 08/19/20 10:04 AM   Result Value Ref Range   WBC 3.9 (L) 4.0 - 10.5 K/uL   RBC 5.15 4.22 - 5.81 Mil/uL   Hemoglobin 13.9 13.0 - 17.0 g/dL   HCT 42.1 39 - 52 %   MCV 81.8 78.0 - 100.0 fl   MCHC 33.0 30.0 - 36.0 g/dL   RDW 13.1 11.5 - 15.5 %   Platelets 232.0 150 - 400 K/uL   Neutrophils Relative % 51.5 43 - 77 %   Lymphocytes Relative 34.9 12 - 46 %   Monocytes Relative 7.3 3 -  12 %   Eosinophils Relative 3.8 0 - 5 %   Basophils Relative 2.5 0 - 3 %   Neutro Abs 2.0 1.4 - 7.7 K/uL   Lymphs Abs 1.4 0.7 - 4.0 K/uL   Monocytes Absolute 0.3 0.1 - 1.0 K/uL   Eosinophils Absolute 0.1 0.0 - 0.7 K/uL   Basophils Absolute 0.1 0.0 - 0.1 K/uL  CK     Status: Abnormal   Collection Time: 08/19/20 10:04 AM  Result Value Ref Range   Total CK 435 (H) 7.0 - 232.0 U/L  Sedimentation rate     Status: None   Collection Time: 08/19/20 10:04 AM  Result Value Ref Range   Sed Rate 9 0 - 15 mm/hr       Assessment and Plan: 39 y.o. male with right hip pain due to DJD.  He will have some benefit with physical therapy but I am not optimistic of full improvement with PT.  I think it is worthwhile doing the PT however.  Discussed next steps would include trial of injection and ultimately hip replacement.  Recheck in about 6 weeks.  Right shoulder pain due to impingement.  This should improve significantly with physical therapy.  Check back in 6 weeks if not better consider injection versus MRI.  Ankle pain following ankle sprain doing pretty well.  Replaced ASO brace today.  Dermatomyositis.  Elevated ANA and CK.  Referral ordered for rheumatology.  First appointment scheduled for December 9.  Follow-up in 6 weeks.   PDMP not reviewed this encounter. Orders Placed This Encounter  Procedures  . [place] LIMITED JOINT SPACE STRUCTURES LOW RIGHT(NO LINKED CHARGES)    Order Specific Question:   Reason for Exam (SYMPTOM  OR DIAGNOSIS REQUIRED)    Answer:   R groin pain    Order Specific Question:   Preferred imaging location?      Answer:   [place]   No orders of the defined types were placed in this encounter.    Discussed warning signs or symptoms. Please see discharge instructions. Patient expresses understanding.   The above documentation has been reviewed and is accurate and complete [name], M.D.

## 2020-09-16 ENCOUNTER — Ambulatory Visit (INDEPENDENT_AMBULATORY_CARE_PROVIDER_SITE_OTHER): Payer: Federal, State, Local not specified - PPO | Admitting: Family Medicine

## 2020-09-16 ENCOUNTER — Encounter: Payer: Self-pay | Admitting: Family Medicine

## 2020-09-16 ENCOUNTER — Ambulatory Visit: Payer: Self-pay

## 2020-09-16 VITALS — BP 130/82 | HR 89 | Ht 67.0 in | Wt 252.4 lb

## 2020-09-16 DIAGNOSIS — M25511 Pain in right shoulder: Secondary | ICD-10-CM

## 2020-09-16 DIAGNOSIS — R1031 Right lower quadrant pain: Secondary | ICD-10-CM | POA: Diagnosis not present

## 2020-09-16 DIAGNOSIS — M3390 Dermatopolymyositis, unspecified, organ involvement unspecified: Secondary | ICD-10-CM

## 2020-09-16 DIAGNOSIS — G8929 Other chronic pain: Secondary | ICD-10-CM

## 2020-09-16 DIAGNOSIS — M1611 Unilateral primary osteoarthritis, right hip: Secondary | ICD-10-CM | POA: Diagnosis not present

## 2020-09-16 DIAGNOSIS — M25572 Pain in left ankle and joints of left foot: Secondary | ICD-10-CM

## 2020-09-16 HISTORY — DX: Unilateral primary osteoarthritis, right hip: M16.11

## 2020-09-16 NOTE — Patient Instructions (Signed)
Thank you for coming in today.  Please keep your appointment with Rheumatology scheduled Dec 9th.   ASO ankle brace here or at [facility] or [facility] supply.   Keep PT.   I can do injection for either the shoulder or hip as needed.   Lets plan on recheck in January.

## 2020-09-17 ENCOUNTER — Ambulatory Visit: Payer: Federal, State, Local not specified - PPO | Admitting: Internal Medicine

## 2020-09-23 ENCOUNTER — Encounter: Payer: Self-pay | Admitting: Internal Medicine

## 2020-09-25 ENCOUNTER — Encounter: Payer: Federal, State, Local not specified - PPO | Admitting: Rehabilitative and Restorative Service Providers"

## 2020-09-25 ENCOUNTER — Telehealth: Payer: Self-pay | Admitting: Rehabilitative and Restorative Service Providers"

## 2020-09-25 NOTE — Telephone Encounter (Signed)
Called and left voice message after no show for today's appointment.  Reminded of next appointment time and also mentioned policy of cancelling visits after 2 no shows in a row until Pt. Were to call back.  Chyrel Masson, PT, DPT, OCS, ATC 09/25/20  8:21 AM

## 2020-09-30 ENCOUNTER — Encounter: Payer: Self-pay | Admitting: Rehabilitative and Restorative Service Providers"

## 2020-09-30 ENCOUNTER — Other Ambulatory Visit: Payer: Self-pay

## 2020-09-30 ENCOUNTER — Ambulatory Visit (INDEPENDENT_AMBULATORY_CARE_PROVIDER_SITE_OTHER): Payer: Federal, State, Local not specified - PPO | Admitting: Rehabilitative and Restorative Service Providers"

## 2020-09-30 DIAGNOSIS — R262 Difficulty in walking, not elsewhere classified: Secondary | ICD-10-CM | POA: Diagnosis not present

## 2020-09-30 DIAGNOSIS — M25611 Stiffness of right shoulder, not elsewhere classified: Secondary | ICD-10-CM

## 2020-09-30 DIAGNOSIS — M25511 Pain in right shoulder: Secondary | ICD-10-CM

## 2020-09-30 DIAGNOSIS — M25551 Pain in right hip: Secondary | ICD-10-CM | POA: Diagnosis not present

## 2020-09-30 DIAGNOSIS — G8929 Other chronic pain: Secondary | ICD-10-CM

## 2020-09-30 DIAGNOSIS — M25651 Stiffness of right hip, not elsewhere classified: Secondary | ICD-10-CM | POA: Diagnosis not present

## 2020-09-30 NOTE — Therapy (Signed)
[facility] Physical Therapy [address] [place], [place], [address] Phone: [phone]   Fax:  [phone]  Physical Therapy Treatment  Patient Details  Name: Evan Burns MRN: [id number] Date of Birth: 1981-06-01 Referring Provider (PT): Rodolph Bong MD   Encounter Date: 09/30/2020   PT End of Session - 09/30/20 0806    Visit Number 2    Number of Visits 16    Date for PT Re-Evaluation 11/21/20    PT Start Time 0801    PT Stop Time 0840    PT Time Calculation (min) 39 min    Activity Tolerance Patient tolerated treatment well    Behavior During Therapy [facility] for tasks assessed/performed           Past Medical History:  Diagnosis Date  . Arthritis of right hip 09/16/2020   X-ray October 2021  . Asthma   . Dermatomyositis (HCC)   . Hypertension     Past Surgical History:  Procedure Laterality Date  . HAND SURGERY    . INCISION AND DRAINAGE OF WOUND    . MASS EXCISION Left 07/27/2019   Procedure: LEFT LONG FINGER NODULE EXCISION;  Surgeon: Tarry Kos, MD;  Location: [facility];  Service: Orthopedics;  Laterality: Left;  . TYMPANOSTOMY TUBE PLACEMENT      There were no vitals filed for this visit.   Subjective Assessment - 09/30/20 0804    Subjective Pt. indicated his shoulder and hip/thigh still present.  Pt. stated shoulder seems to be doing some better but hip/thigh feels worse (walking, sitting lower).    Pertinent History Dermatomyositis, HTN    Patient Stated Goals Decrease R shoulder and hip pain    Currently in Pain? Yes    Pain Score 8    Rt hip   Pain Location Hip    Pain Orientation Right    Pain Descriptors / Indicators Tightness;Sharp;Aching    Pain Onset More than a month ago    Aggravating Factors  deep sitting, walking                             [facility] Adult PT Treatment/Exercise - 09/30/20 0001      Knee/Hip Exercises: Stretches   Other Knee/Hip Stretches supine thomas stretch 30  sec x 3    Other Knee/Hip Stretches supine adductor stretch 30 sec x 3      Knee/Hip Exercises: Supine   Bridges 15 reps    Other Supine Knee/Hip Exercises supine slr x 5 c pain reported    Other Supine Knee/Hip Exercises supine march Rt 2 x 10      Manual Therapy   Manual therapy comments percussive device to Rt hip flexors, pectinus, proximal adductors.  compression c movement to Rt rectus femoris                   PT Education - 09/30/20 0832    Education Details DN    Person(s) Educated Patient    Methods Explanation;Handout    Comprehension Verbalized understanding               PT Long Term Goals - 09/15/20 1204      PT LONG TERM GOAL #1   Title Improve R shoulder and hip pain to consistently 0-3/10 on the Numeric Pain Rating Scale.    Baseline Can be 6-7/10    Time 8    Period Weeks  Status New    Target Date 11/21/20      PT LONG TERM GOAL #2   Title Improve FOTO score to 80.    Baseline 67    Time 8    Period Weeks    Status New    Target Date 11/21/20      PT LONG TERM GOAL #3   Title Improve R shoulder AROM for flexion to 170; ER to 90; IR to 70 and HA to 40 degrees.    Baseline See objective.    Time 8    Period Weeks    Status New    Target Date 11/21/20      PT LONG TERM GOAL #4   Title Improve R hip flexion to 100 degrees; hamstrings to 50 degrees; hip ER to 40 degrees and IR to 10 degrees.    Baseline See Objective.    Time 8    Period Weeks    Status New    Target Date 11/21/20      PT LONG TERM GOAL #5   Title Evan Burns will be independent with his HEP at DC.    Time 8    Period Weeks    Status New    Target Date 11/21/20                 Plan - 09/30/20 1540    Clinical Impression Statement Rt hip pain mor enoted compared to shoulder at this time.  Mild joint restriction noted in Rt hip mobility today but more pain prominent c active contractile tissue as noted c painful active Rt hip flexion, SLR attempts and  much reduction in symptoms in same passive movement.  Discussed myofascial release techniques in area (DN, percussive device, etc).  Compresison c movement improved ability to perform hip flexion in supine.    Examination-Activity Limitations Dressing;Sit;Sleep;Bed Mobility;Bend;Squat;Stairs;Reach Overhead;Stand    Examination-Participation Restrictions Occupation;Community Activity    Stability/Clinical Decision Making Stable/Uncomplicated    Rehab Potential Good    PT Frequency 2x / week    PT Duration 8 weeks    PT Treatment/Interventions ADLs/Self Care Home Management;Cryotherapy;Therapeutic activities;Gait training;Therapeutic exercise;Neuromuscular re-education;Functional mobility training;Patient/family education;Manual techniques    PT Next Visit Plan Follow up on myofascial mobility for Rt hip, strengthening.  Resume shoulder related strengthening.    PT Home Exercise Plan [id number]    Consulted and Agree with Plan of Care Patient           Patient will benefit from skilled therapeutic intervention in order to improve the following deficits and impairments:  Abnormal gait, Decreased endurance, Decreased range of motion, Decreased mobility, Decreased strength, Difficulty walking, Hypomobility, Impaired flexibility, Impaired UE functional use, Pain  Visit Diagnosis: Chronic pain of right hip  Pain of right shoulder joint on movement  Difficulty walking  Stiffness of right hip, not elsewhere classified  Stiffness of right shoulder, not elsewhere classified     Problem List Patient Active Problem List   Diagnosis Date Noted  . Arthritis of right hip 09/16/2020  . Tinea cruris 07/28/2020  . Leg pain, bilateral 07/28/2020  . Prediabetes 03/19/2020  . Subcutaneous mass of finger of left hand 07/27/2019  . Nodule of skin of finger of left hand 07/12/2019  . Chest mass 02/14/2019  . Other chest pain 02/14/2019  . Superficial phlebitis, chest wall 02/14/2019  . Fatigue  11/22/2018  . Essential hypertension 10/12/2010  . Dermatomyositis (HCC) 06/17/2008  . Asthma 02/22/2007    Chyrel Masson, PT, DPT, OCS,  ATC 09/30/20  8:33 AM    [facility] Physical Therapy [address] [place], [place], [address] Phone: [phone]   Fax:  [phone]  Name: Evan Burns MRN: [id number] Date of Birth: 1981-03-07

## 2020-10-02 ENCOUNTER — Ambulatory Visit (INDEPENDENT_AMBULATORY_CARE_PROVIDER_SITE_OTHER): Payer: Federal, State, Local not specified - PPO | Admitting: Internal Medicine

## 2020-10-02 ENCOUNTER — Ambulatory Visit: Payer: Self-pay

## 2020-10-02 ENCOUNTER — Encounter: Payer: Self-pay | Admitting: Internal Medicine

## 2020-10-02 ENCOUNTER — Other Ambulatory Visit: Payer: Self-pay

## 2020-10-02 VITALS — BP 143/87 | HR 81 | Ht 67.0 in | Wt 251.0 lb

## 2020-10-02 DIAGNOSIS — L942 Calcinosis cutis: Secondary | ICD-10-CM

## 2020-10-02 DIAGNOSIS — M3390 Dermatopolymyositis, unspecified, organ involvement unspecified: Secondary | ICD-10-CM | POA: Diagnosis not present

## 2020-10-02 DIAGNOSIS — M1611 Unilateral primary osteoarthritis, right hip: Secondary | ICD-10-CM | POA: Diagnosis not present

## 2020-10-02 DIAGNOSIS — M25572 Pain in left ankle and joints of left foot: Secondary | ICD-10-CM

## 2020-10-02 NOTE — Progress Notes (Signed)
Office Visit Note  Patient: Evan Burns             Date of Birth: 21-Oct-1981           MRN: [id number]             PCP: Binnie Rail, MD Referring: Gregor Hams, MD Visit Date: 10/02/2020 Occupation: Special educational needs teacher  Subjective:  Pain of the Right Hip, Pain and Edema of the Left Ankle, Pain of the Right Shoulder, New Patient (Initial Visit), and Rash (Face, neck, bilateral upper extremities)   History of Present Illness: Evan Burns is a 39 y.o. male with a history of dermatomyositis here for evaluation of myalgias and skin problems. He has a long history of DM diagnosed around 20 years ago originally with muscle and skin disease activity. He was treated with steroids for this with apparent resolution of his muscle inflammation. Since that time he has had continued development of calcifications that sometimes become swollen or infected but no known recurrence of muscular involvement. He takes treatments for asthma but no known ILD. Today he complains of joint pain in multiple sites the right groin, left ankle, and right shoulder. He has had previous imaging of the shoulder and hip showing right shoulder bursitis and moderately advanced OA of the right hip. He has not had imaging for the left ankle pain. He does see swelling around the left ankle sometimes and worse after working all day. He has not noticed any focal weakness except around his right hip and thigh. He denies cough, dyspnea, fevers, weight loss, or lymphadenopathy.   Labs reviewed ANA 1:80 nuclear, speckled CK 435 WBC 3.9 ESR 9  Activities of Daily Living:  Patient reports morning stiffness for 10 minutes.   Patient Reports nocturnal pain.  Difficulty dressing/grooming: Reports Difficulty climbing stairs: Reports Difficulty getting out of chair: Reports Difficulty using hands for taps, buttons, cutlery, and/or writing: Denies  Review of Systems  Constitutional: Positive for fatigue.  HENT: Negative  for mouth sores, mouth dryness and nose dryness.   Eyes: Negative for pain, itching, visual disturbance and dryness.  Respiratory: Negative for cough, hemoptysis, shortness of breath and difficulty breathing.   Cardiovascular: Negative for chest pain, palpitations and swelling in legs/feet.  Gastrointestinal: Negative for abdominal pain, blood in stool, constipation and diarrhea.  Endocrine: Negative for increased urination.  Genitourinary: Negative for painful urination.  Musculoskeletal: Positive for arthralgias, joint pain, joint swelling, myalgias, muscle weakness, morning stiffness, muscle tenderness and myalgias.  Skin: Positive for rash, nodules/bumps, redness, ulcers and sensitivity to sunlight. Negative for color change, hair loss and skin tightness.  Allergic/Immunologic: Negative for susceptible to infections.  Neurological: Positive for headaches and weakness. Negative for dizziness, numbness and memory loss.  Hematological: Negative for swollen glands.  Psychiatric/Behavioral: Negative for confusion and sleep disturbance.    PMFS History:  Patient Active Problem List   Diagnosis Date Noted  . Pain in left ankle and joints of left foot 10/02/2020  . 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 Arthritis of right hip 09/16/2020  . Tinea cruris 07/28/2020  . Leg pain, bilateral 07/28/2020  . Prediabetes 03/19/2020  . Subcutaneous mass of finger of left hand 07/27/2019  . Nodule of skin of finger of left hand 07/12/2019  . Chest mass 02/14/2019  . Other chest pain 02/14/2019  . Superficial phlebitis, chest wall 02/14/2019  . Fatigue 11/22/2018  . Essential hypertension 10/12/2010  . Dermatomyositis ([place]) 06/17/2008  . Asthma 02/22/2007  Past Medical History:  Diagnosis Date  . Arthritis of right hip 09/16/2020   X-ray October 2021  . Asthma   . Dermatomyositis ([place])   . Hypertension     Family History  Problem Relation Age of Onset  . Hypertension Mother   . Diabetes Mother    . Kidney disease Mother   . Hyperlipidemia Mother   . Arthritis Mother   . Hypertension Father   . Hyperlipidemia Father    Past Surgical History:  Procedure Laterality Date  . HAND SURGERY    . INCISION AND DRAINAGE OF WOUND    . MASS EXCISION Left 07/27/2019   Procedure: LEFT LONG FINGER NODULE EXCISION;  Surgeon: Leandrew Koyanagi, MD;  Location: [facility];  Service: Orthopedics;  Laterality: Left;  . TYMPANOSTOMY TUBE PLACEMENT     Social History   Social History Narrative   Pt lives at home w/ parents       Very active at work - works two jobs   Immunization History  Administered Date(s) Administered  . Influenza Split 08/25/2011, 07/25/2012  . Influenza Whole 08/08/2007, 08/14/2008, 07/23/2009, 07/21/2010  . Influenza,inj,Quad PF,6+ Mos 07/24/2013, 07/23/2014, 07/01/2015, 07/05/2016, 08/06/2017, 08/04/2018, 09/07/2019, 07/28/2020  . PFIZER SARS-COV-2 Vaccination 01/07/2020, 01/28/2020, 09/22/2020  . Pneumococcal Polysaccharide-23 10/01/2014  . Tdap 09/13/2013, 04/18/2019     Objective: Vital Signs: BP (!) 143/87 (BP Location: Left Arm, Patient Position: Sitting, Cuff Size: Normal)   Pulse 81   Ht '5\' 7"'  (1.702 m)   Wt 251 lb (113.9 kg)   BMI 39.31 kg/m    Physical Exam HENT:     Right Ear: External ear normal.     Left Ear: External ear normal.     Mouth/Throat:     Mouth: Mucous membranes are moist.     Pharynx: Oropharynx is clear.  Eyes:     Conjunctiva/sclera: Conjunctivae normal.  Cardiovascular:     Rate and Rhythm: Normal rate and regular rhythm.  Pulmonary:     Effort: Pulmonary effort is normal.     Breath sounds: Normal breath sounds.  Skin:    General: Skin is warm and dry.     Comments: Diffuse scattered hyperpigmentation and hypopigmentation over whole trunk and extremities upper > lower  Numerous calcified nodules throughout upper extremities from upper arm to digits  Neurological:     General: No focal deficit present.      Mental Status: He is alert.     Comments: Right hip flexion 4/5 with pain rest 5/5 on exam     Musculoskeletal Exam:  Neck full range of motion no tenderness Shoulder full range of motion some pain with extension and internal rotation, tenderness over bicipital groove without obvious swelling Wrist and fingers full ROM numerous nodules over dorsal aspect of joints Mild paraspinal tenderness to palpation over lower back Right hip internal rotation is reduced and painful compared to left hip rotation Knees full ROM with patellofemoral crepitus present bilaterally Left ankle dorsiflexion and plantarflexion are reduced ROM, overlying soft tissue swelling present  Investigation: No additional findings.  Imaging: XR Ankle 2 Views Left  Result Date: 10/02/2020 Xray left ankle 2 views Anterior and posterior osteophytes of tibiotalar joint with mostly preserved joint space. Mild spurring on dorsal midfoot. No erosions seen. Bone mineralization appears normal. Some soft tissue swelling overlying ankle, more anterior than posterior. Impression Moderate OA of left ankle joint   Recent Labs: Lab Results  Component Value Date   WBC 3.9 (L) 08/19/2020  HGB 13.9 08/19/2020   PLT 232.0 08/19/2020   NA 138 07/28/2020   K 3.9 07/28/2020   CL 106 07/28/2020   CO2 26 07/28/2020   GLUCOSE 101 (H) 07/28/2020   BUN 17 07/28/2020   CREATININE 0.97 07/28/2020   BILITOT 0.3 07/28/2020   ALKPHOS 41 07/28/2020   AST 22 07/28/2020   ALT 31 07/28/2020   PROT 6.6 07/28/2020   ALBUMIN 4.1 07/28/2020   CALCIUM 8.7 07/28/2020   GFRAA  02/01/2010    >60        The eGFR has been calculated using the MDRD equation. This calculation has not been validated in all clinical situations. eGFR's persistently <60 mL/min signify possible Chronic Kidney Disease.    Speciality Comments: No specialty comments available.  Procedures:  No procedures performed Allergies: Sulfonamide derivatives, Cephalexin,  and Lisinopril   Assessment / Plan:     Visit Diagnoses: Dermatomyositis ([place])  Calcinosis cutis- Plan: Sedimentation rate, CK, Aldolase, Parathyroid hormone, intact (no Ca), VITAMIN D 25 Hydroxy (Vit-D Deficiency, Fractures), Phosphorus, Serum protein electrophoresis with reflex  Mr. Rockwell has extensive changes of long term dermatomyositis also moderately advanced OA for which his previous steroid treatments and obesity are risk factors. His current skin disease seems mostly chronic but sounds like may have some ongoing progression of calcinosis. We will repeat ESR and CK levels today, plus checking phosphorus PTH vitamin D and SPEP since might benefit with trial of treatment for this such as bisphosphonate. Will f/u after results and also attempt to obtain previous records [facility] for review of past DM workup.  Arthritis of right hip Pain in left ankle and joints of left foot - Plan: XR Ankle 2 Views Left  Hip pain is consistent with OA and ankle also seems to be OA with radiographic changes and reduced ROM. He has reduced strength in right hip flexion however I am more suspicious for this related to his OA than active myositis at this time. He is already enrolled with physical therapy which is first line. We discussed other options including antiinflammatory medication or joint injections, with long term likely to benefit from joint replacement surgery.  Orders: Orders Placed This Encounter  Procedures  . XR Ankle 2 Views Left  . Sedimentation rate  . CK  . Aldolase  . Parathyroid hormone, intact (no Ca)  . VITAMIN D 25 Hydroxy (Vit-D Deficiency, Fractures)  . Phosphorus  . Serum protein electrophoresis with reflex   No orders of the defined types were placed in this encounter.   Follow-Up Instructions: Return in about 3 weeks (around 10/23/2020) for Dermatomyositis.   Collier Salina, MD  Note - This record has been created using [employer].  Chart creation errors have  been sought, but may not always  have been located. Such creation errors do not reflect on  the standard of medical care.

## 2020-10-02 NOTE — Patient Instructions (Addendum)
I am checking your ankle for osteoarthritis changes. I do not see evidence of inflammation of the joints on exam today. I am repeating some tests looking for inflammation of the muscles that could represent return of dermatomyositis activity. There are some medication treatments that could be beneficial for skin calcifications but first will check blood and urine tests to look for evidence of any calcium and vitamin D problems.   Dermatomyositis Dermatomyositis is a rare muscle disease that causes muscle weakness and a rash. It usually affects muscles that are closest to the trunk of your body (torso). You may have a combination of muscle weakness, pain, and swelling. This condition can make it hard to rise from a sitting position, climb stairs, lift objects, or reach over your head. What are the causes? The cause of this condition is not known. What increases the risk? You are more likely to develop this condition if:  You are male.  You have cancer.  You have lung disease. What are the signs or symptoms? Symptoms of this condition may include:  A bluish-purple rash that may be itchy. This often develops before the muscle weakness. It appears on the face, neck, shoulders, upper chest, elbows, knees, knuckles, and back.  Swelling of the face and eyelids.  Hardened bumps of calcium deposits under your skin (especially in children).  Trouble swallowing, talking, and breathing.  Muscle aches and tenderness.  Fatigue.  Weight loss.  Low fever.  Open wounds (ulcers).  Heartburn from changes in muscles of the esophagus.  Muscle weakness, swelling, and pain. How is this diagnosed? This condition may be diagnosed based on:  Blood tests.  A test to check whether your muscles are responding properly to electrical signals from your nerves (electromyogram).  Removal of a small tissue sample from a weakened muscle to be examined under a microscope (muscle biopsy).  Removal of a  small skin sample from an area with a rash to be examined under a microscope (skin biopsy).  Chest X-ray.  MRI. How is this treated? This condition may be treated with:  Medicines that weaken and decrease the activity of your body's disease-fighting system (immune system) and reduce inflammation (corticosteroids).  Medicine containing antibodies to help your body fight infections (immunoglobulin).  Medicines to reduce inflammation in your body.  Physical therapy and exercise to prevent muscle loss (atrophy). Follow these instructions at home:  Wear protective clothing and high-protection sunscreen when you go outside. Areas that are affected by a rash are more sensitive to the sun.  Use skin creams or ointments as told by your health care provider.  Take over-the-counter and prescription medicines only as told by your health care provider.  If physical therapy was prescribed, do exercises as directed.  Do not eat right before bedtime. Raise (elevate) the head of your bed to decrease heartburn.  If directed by your health care provider, eat more protein in your diet. Contact a health care provider if:  Your condition gets worse.  You have unexplained muscle weakness.  You develop a new rash.  You have a fever and cough. Get help right away if:  You have difficulty swallowing.  You develop breathing problems. Summary  Dermatomyositis is a rare muscle disease that causes muscle weakness and a rash.  You may have a combination of muscle weakness, pain, and swelling.  Treatment may include medicines and physical therapy.  Wear protective clothing and high-protection sunscreen when you go outside. Areas that are affected by a rash are  more sensitive to the sun.    Osteoarthritis  Osteoarthritis is a type of arthritis that affects tissue that covers the ends of bones in joints (cartilage). Cartilage acts as a cushion between the bones and helps them move smoothly.  Osteoarthritis results when cartilage in the joints gets worn down. Osteoarthritis is sometimes called "wear and tear" arthritis. Osteoarthritis is the most common form of arthritis. It often occurs in older people. It is a condition that gets worse over time (a progressive condition). Joints that are most often affected by this condition are in:  Fingers.  Toes.  Hips.  Knees.  Spine, including neck and lower back. What are the causes? This condition is caused by age-related wearing down of cartilage that covers the ends of bones. What increases the risk? The following factors may make you more likely to develop this condition:  Older age.  Being overweight or obese.  Overuse of joints, such as in athletes.  Past injury of a joint.  Past surgery on a joint.  Family history of osteoarthritis. What are the signs or symptoms? The main symptoms of this condition are pain, swelling, and stiffness in the joint. The joint may lose its shape over time. Small pieces of bone or cartilage may break off and float inside of the joint, which may cause more pain and damage to the joint. Small deposits of bone (osteophytes) may grow on the edges of the joint. Other symptoms may include:  A grating or scraping feeling inside the joint when you move it.  Popping or creaking sounds when you move. Symptoms may affect one or more joints. Osteoarthritis in a major joint, such as your knee or hip, can make it painful to walk or exercise. If you have osteoarthritis in your hands, you might not be able to grip items, twist your hand, or control small movements of your hands and fingers (fine motor skills). How is this diagnosed? This condition may be diagnosed based on:  Your medical history.  A physical exam.  Your symptoms.  X-rays of the affected joint(s).  Blood tests to rule out other types of arthritis. How is this treated? There is no cure for this condition, but treatment can help to  control pain and improve joint function. Treatment plans may include:  A prescribed exercise program that allows for rest and joint relief. You may work with a physical therapist.  A weight control plan.  Pain relief techniques, such as: ? Applying heat and cold to the joint. ? Electric pulses delivered to nerve endings under the skin (transcutaneous electrical nerve stimulation, or TENS). ? Massage. ? Certain nutritional supplements.  NSAIDs or prescription medicines to help relieve pain.  Medicine to help relieve pain and inflammation (corticosteroids). This can be given by mouth (orally) or as an injection.  Assistive devices, such as a brace, wrap, splint, specialized glove, or cane.  Surgery, such as: ? An osteotomy. This is done to reposition the bones and relieve pain or to remove loose pieces of bone and cartilage. ? Joint replacement surgery. You may need this surgery if you have very bad (advanced) osteoarthritis. Follow these instructions at home: Activity  Rest your affected joints as directed by your health care provider.  Do not drive or use heavy machinery while taking prescription pain medicine.  Exercise as directed. Your health care provider or physical therapist may recommend specific types of exercise, such as: ? Strengthening exercises. These are done to strengthen the muscles that support joints  that are affected by arthritis. They can be performed with weights or with exercise bands to add resistance. ? Aerobic activities. These are exercises, such as brisk walking or water aerobics, that get your heart pumping. ? Range-of-motion activities. These keep your joints easy to move. ? Balance and agility exercises. Managing pain, stiffness, and swelling      If directed, apply heat to the affected area as often as told by your health care provider. Use the heat source that your health care provider recommends, such as a moist heat pack or a heating pad. ? If  you have a removable assistive device, remove it as told by your health care provider. ? Place a towel between your skin and the heat source. If your health care provider tells you to keep the assistive device on while you apply heat, place a towel between the assistive device and the heat source. ? Leave the heat on for 20-30 minutes. ? Remove the heat if your skin turns bright red. This is especially important if you are unable to feel pain, heat, or cold. You may have a greater risk of getting burned.  If directed, put ice on the affected joint: ? If you have a removable assistive device, remove it as told by your health care provider. ? Put ice in a plastic bag. ? Place a towel between your skin and the bag. If your health care provider tells you to keep the assistive device on during icing, place a towel between the assistive device and the bag. ? Leave the ice on for 20 minutes, 2-3 times a day. General instructions  Take over-the-counter and prescription medicines only as told by your health care provider.  Maintain a healthy weight. Follow instructions from your health care provider for weight control. These may include dietary restrictions.  Do not use any products that contain nicotine or tobacco, such as cigarettes and e-cigarettes. These can delay bone healing. If you need help quitting, ask your health care provider.  Use assistive devices as directed by your health care provider.  Keep all follow-up visits as told by your health care provider. This is important. Where to find more information  [employer] of Arthritis and Musculoskeletal and Skin Diseases: www.niams.http://www.myers.net/  [employer] on Aging: https://walker.com/  American College of Rheumatology: www.rheumatology.org Contact a health care provider if:  Your skin turns red.  You develop a rash.  You have pain that gets worse.  You have a fever along with joint or muscle aches. Get help right away  if:  You lose a lot of weight.  You suddenly lose your appetite.  You have night sweats. Summary  Osteoarthritis is a type of arthritis that affects tissue covering the ends of bones in joints (cartilage).  This condition is caused by age-related wearing down of cartilage that covers the ends of bones.  The main symptom of this condition is pain, swelling, and stiffness in the joint.  There is no cure for this condition, but treatment can help to control pain and improve joint function. This information is not intended to replace advice given to you by your health care provider. Make sure you discuss any questions you have with your health care provider. Document Revised: 09/23/2017 Document Reviewed: 06/14/2016 Elsevier Patient Education  2020 [employer].

## 2020-10-03 ENCOUNTER — Encounter: Payer: Federal, State, Local not specified - PPO | Admitting: Rehabilitative and Restorative Service Providers"

## 2020-10-06 LAB — PROTEIN ELECTROPHORESIS, SERUM, WITH REFLEX
Albumin ELP: 4.2 g/dL (ref 3.8–4.8)
Alpha 1: 0.3 g/dL (ref 0.2–0.3)
Alpha 2: 0.7 g/dL (ref 0.5–0.9)
Beta 2: 0.5 g/dL (ref 0.2–0.5)
Beta Globulin: 0.4 g/dL (ref 0.4–0.6)
Gamma Globulin: 0.9 g/dL (ref 0.8–1.7)
Total Protein: 7 g/dL (ref 6.1–8.1)

## 2020-10-06 LAB — SEDIMENTATION RATE: Sed Rate: 6 mm/h (ref 0–15)

## 2020-10-06 LAB — ALDOLASE: Aldolase: 11.5 U/L — ABNORMAL HIGH (ref <=8.1)

## 2020-10-06 LAB — CK: Total CK: 738 U/L — ABNORMAL HIGH (ref 44–196)

## 2020-10-06 LAB — PARATHYROID HORMONE, INTACT (NO CA): PTH: 58 pg/mL (ref 14–64)

## 2020-10-06 LAB — VITAMIN D 25 HYDROXY (VIT D DEFICIENCY, FRACTURES): Vit D, 25-Hydroxy: 24 ng/mL — ABNORMAL LOW (ref 30–100)

## 2020-10-06 LAB — PHOSPHORUS: Phosphorus: 4.3 mg/dL (ref 2.5–4.5)

## 2020-10-07 ENCOUNTER — Encounter: Payer: Federal, State, Local not specified - PPO | Admitting: Rehabilitative and Restorative Service Providers"

## 2020-10-09 ENCOUNTER — Ambulatory Visit (INDEPENDENT_AMBULATORY_CARE_PROVIDER_SITE_OTHER): Payer: Federal, State, Local not specified - PPO | Admitting: Rehabilitative and Restorative Service Providers"

## 2020-10-09 ENCOUNTER — Other Ambulatory Visit: Payer: Self-pay

## 2020-10-09 ENCOUNTER — Encounter: Payer: Self-pay | Admitting: Rehabilitative and Restorative Service Providers"

## 2020-10-09 DIAGNOSIS — R262 Difficulty in walking, not elsewhere classified: Secondary | ICD-10-CM | POA: Diagnosis not present

## 2020-10-09 DIAGNOSIS — M25551 Pain in right hip: Secondary | ICD-10-CM

## 2020-10-09 DIAGNOSIS — M25511 Pain in right shoulder: Secondary | ICD-10-CM | POA: Diagnosis not present

## 2020-10-09 DIAGNOSIS — M25611 Stiffness of right shoulder, not elsewhere classified: Secondary | ICD-10-CM

## 2020-10-09 DIAGNOSIS — M25651 Stiffness of right hip, not elsewhere classified: Secondary | ICD-10-CM | POA: Diagnosis not present

## 2020-10-09 DIAGNOSIS — G8929 Other chronic pain: Secondary | ICD-10-CM

## 2020-10-09 NOTE — Therapy (Addendum)
[facility] [address] [place], [place], [address] Phone: [phone]   Fax:  [phone]  Physical Therapy Treatment/Discharge  Patient Details  Name: Evan Burns MRN: [id number] Date of Birth: 01/02/1981 Referring Provider (PT): Evan S Corey MD   Encounter Date: 10/09/2020   PT End of Session - 10/09/20 0800    Visit Number 3    Number of Visits 16    Date for PT Re-Evaluation 11/21/20    PT Start Time 0800    PT Stop Time 0840    PT Time Calculation (min) 40 min    Activity Tolerance Patient tolerated treatment well    Behavior During Therapy WFL for tasks assessed/performed           Past Medical History:  Diagnosis Date  . Arthritis of right hip 09/16/2020   X-ray October 2021  . Asthma   . Dermatomyositis (HCC)   . Hypertension     Past Surgical History:  Procedure Laterality Date  . HAND SURGERY    . INCISION AND DRAINAGE OF WOUND    . MASS EXCISION Left 07/27/2019   Procedure: LEFT LONG FINGER NODULE EXCISION;  Surgeon: Xu, Naiping M, MD;  Location: [facility];  Service: Orthopedics;  Laterality: Left;  . TYMPANOSTOMY TUBE PLACEMENT      There were no vitals filed for this visit.   Subjective Assessment - 10/09/20 0809    Subjective Pt. indicated while pain in hip is still there, he felt like he could lift a bit better and had less pain that prior.    Pertinent History Dermatomyositis, HTN    Patient Stated Goals Decrease R shoulder and hip pain    Currently in Pain? Yes    Pain Orientation Right    Pain Type Chronic pain    Pain Onset More than a month ago    Pain Frequency Intermittent    Aggravating Factors  trying to lift Rt leg                             [facility] Adult PT Treatment/Exercise - 10/09/20 0001      Knee/Hip Exercises: Stretches   Other Knee/Hip Stretches supine thomas stretch 30 sec x 3      Knee/Hip Exercises: Machines for Strengthening   Cybex Knee  Extension eccentric LAQ 20 lbs 3 x 10      Knee/Hip Exercises: Standing   Lateral Step Up 20 reps;Right;Step Height: 4"   eccentric control focus   Forward Step Up Step Height: 6";20 reps;Right      Knee/Hip Exercises: Supine   Bridges 20 reps;Both    Other Supine Knee/Hip Exercises supine isometric hip flexion hold 90 deg 5 sec x 10 Rt      Shoulder Exercises: Standing   External Rotation Right   3 x 10 blue band c towel roll   Theraband Level (Shoulder External Rotation) Level 4 (Blue)    Internal Rotation Right   3 x 10 blue band c towel roll   Theraband Level (Shoulder Internal Rotation) Level 4 (Blue)      Manual Therapy   Manual therapy comments percussive device to Rt hip flexors, pectinus, proximal adductors.  compression c movement to Rt rectus femoris                        PT Long Term Goals - 09/15/20 1204        PT LONG TERM GOAL #1   Title Improve R shoulder and hip pain to consistently 0-3/10 on the Numeric Pain Rating Scale.    Baseline Can be 6-7/10    Time 8    Period Weeks    Status New    Target Date 11/21/20      PT LONG TERM GOAL #2   Title Improve FOTO score to 80.    Baseline 67    Time 8    Period Weeks    Status New    Target Date 11/21/20      PT LONG TERM GOAL #3   Title Improve R shoulder AROM for flexion to 170; ER to 90; IR to 70 and HA to 40 degrees.    Baseline See objective.    Time 8    Period Weeks    Status New    Target Date 11/21/20      PT LONG TERM GOAL #4   Title Improve R hip flexion to 100 degrees; hamstrings to 50 degrees; hip ER to 40 degrees and IR to 10 degrees.    Baseline See Objective.    Time 8    Period Weeks    Status New    Target Date 11/21/20      PT LONG TERM GOAL #5   Title Evan Burns will be independent with his HEP at DC.    Time 8    Period Weeks    Status New    Target Date 11/21/20                 Plan - 10/09/20 0818    Clinical Impression Statement Pt. demonstrated  improvement in active Rt hip flexion seated and very small improvement in SLR lift ability but still limited at this time.  Posititive noted symptom report c manual intervention last visit.  Continued strengthening for improved contractile tissue performance indicated at this time.  Poor eccentric WB control on Rt LE at this time. Continued reduced complaints from shoulder overall.    Examination-Activity Limitations Dressing;Sit;Sleep;Bed Mobility;Bend;Squat;Stairs;Reach Overhead;Stand    Examination-Participation Restrictions Occupation;Community Activity    Stability/Clinical Decision Making Stable/Uncomplicated    Rehab Potential Good    PT Frequency 2x / week    PT Duration 8 weeks    PT Treatment/Interventions ADLs/Self Care Home Management;Cryotherapy;Therapeutic activities;Gait training;Therapeutic exercise;Neuromuscular re-education;Functional mobility training;Patient/family education;Manual techniques    PT Next Visit Plan Follow up on myofascial mobility for Rt hip, strengthening, Rt shoulder stabilty/strengthening    PT Home Exercise Plan [email or web address]    Consulted and Agree with Plan of Care Patient           Patient will benefit from skilled therapeutic intervention in order to improve the following deficits and impairments:  Abnormal gait,Decreased endurance,Decreased range of motion,Decreased mobility,Decreased strength,Difficulty walking,Hypomobility,Impaired flexibility,Impaired UE functional use,Pain  Visit Diagnosis: Chronic pain of right hip  Pain of right shoulder joint on movement  Difficulty walking  Stiffness of right hip, not elsewhere classified  Stiffness of right shoulder, not elsewhere classified     Problem List Patient Active Problem List   Diagnosis Date Noted  . Pain in left ankle and joints of left foot 10/02/2020  . 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 Arthritis of right hip 09/16/2020  . Tinea cruris 07/28/2020  . Leg pain, bilateral 07/28/2020  .  Prediabetes 03/19/2020  . Subcutaneous mass of finger of left hand 07/27/2019  . Nodule of skin of finger of left hand 07/12/2019  . Chest mass   02/14/2019  . Other chest pain 02/14/2019  . Superficial phlebitis, chest wall 02/14/2019  . Fatigue 11/22/2018  . Essential hypertension 10/12/2010  . Dermatomyositis ([place]) 06/17/2008  . Asthma 02/22/2007    Scot Jun, PT, DPT, OCS, ATC 10/09/20  8:35 AM  PHYSICAL THERAPY DISCHARGE SUMMARY  Visits from Start of Care: 3  Current functional level related to goals / functional outcomes: See note   Remaining deficits: See note   Education / Equipment: HEP  Plan: Patient agrees to discharge.  Patient goals were partially met. Patient is being discharged due to not returning since the last visit.  ?????     Scot Jun, PT, DPT, OCS, ATC 12/09/20  10:17 AM    [facility] [address] [place], [place], [address] Phone: [phone]   Fax:  [phone]  Name: Evan Burns MRN: [id number] Date of Birth: 1981/04/09

## 2020-10-14 ENCOUNTER — Encounter: Payer: Federal, State, Local not specified - PPO | Admitting: Rehabilitative and Restorative Service Providers"

## 2020-10-14 NOTE — Progress Notes (Signed)
Lab results do show increase in muscle enzymes this indicates some active muscle inflammation is present, not just skin disease. Based on this I anticipate we need to start antiinflammatory medication unless symptoms are improving. Vitamin D level is also low and would benefit with taking a daily supplement either 800 or 1000 units daily. Chest xray shows no evidence of lung disease involvement.

## 2020-10-16 ENCOUNTER — Encounter: Payer: Federal, State, Local not specified - PPO | Admitting: Rehabilitative and Restorative Service Providers"

## 2020-10-21 ENCOUNTER — Encounter: Payer: Federal, State, Local not specified - PPO | Admitting: Rehabilitative and Restorative Service Providers"

## 2020-10-23 ENCOUNTER — Encounter: Payer: Federal, State, Local not specified - PPO | Admitting: Rehabilitative and Restorative Service Providers"

## 2020-10-28 ENCOUNTER — Encounter: Payer: Self-pay | Admitting: Internal Medicine

## 2020-10-28 ENCOUNTER — Other Ambulatory Visit: Payer: Self-pay

## 2020-10-28 ENCOUNTER — Ambulatory Visit: Payer: Federal, State, Local not specified - PPO | Admitting: Internal Medicine

## 2020-10-28 VITALS — BP 126/78 | HR 80 | Ht 67.0 in | Wt 252.3 lb

## 2020-10-28 DIAGNOSIS — M3390 Dermatopolymyositis, unspecified, organ involvement unspecified: Secondary | ICD-10-CM

## 2020-10-28 DIAGNOSIS — M1611 Unilateral primary osteoarthritis, right hip: Secondary | ICD-10-CM | POA: Diagnosis not present

## 2020-10-28 DIAGNOSIS — L942 Calcinosis cutis: Secondary | ICD-10-CM | POA: Diagnosis not present

## 2020-10-28 NOTE — Patient Instructions (Signed)
  Dermatomyositis Dermatomyositis is a rare muscle disease that causes muscle weakness and a rash. It usually affects muscles that are closest to the trunk of your body (torso). You may have a combination of muscle weakness, pain, and swelling. This condition can make it hard to rise from a sitting position, climb stairs, lift objects, or reach over your head. What are the causes? The cause of this condition is not known. What increases the risk? You are more likely to develop this condition if:  You are male.  You have cancer.  You have lung disease. What are the signs or symptoms? Symptoms of this condition may include:  A bluish-purple rash that may be itchy. This often develops before the muscle weakness. It appears on the face, neck, shoulders, upper chest, elbows, knees, knuckles, and back.  Swelling of the face and eyelids.  Hardened bumps of calcium deposits under your skin (especially in children).  Trouble swallowing, talking, and breathing.  Muscle aches and tenderness.  Fatigue.  Weight loss.  Low fever.  Open wounds (ulcers).  Heartburn from changes in muscles of the esophagus.  Muscle weakness, swelling, and pain. How is this diagnosed? This condition may be diagnosed based on:  Blood tests.  A test to check whether your muscles are responding properly to electrical signals from your nerves (electromyogram).  Removal of a small tissue sample from a weakened muscle to be examined under a microscope (muscle biopsy).  Removal of a small skin sample from an area with a rash to be examined under a microscope (skin biopsy).  Chest X-ray.  MRI. How is this treated? This condition may be treated with:  Medicines that weaken and decrease the activity of your body's disease-fighting system (immune system) and reduce inflammation (corticosteroids).  Medicine containing antibodies to help your body fight infections (immunoglobulin).  Medicines to reduce  inflammation in your body.  Physical therapy and exercise to prevent muscle loss (atrophy). Follow these instructions at home:  Wear protective clothing and high-protection sunscreen when you go outside. Areas that are affected by a rash are more sensitive to the sun.  Use skin creams or ointments as told by your health care provider.  Take over-the-counter and prescription medicines only as told by your health care provider.  If physical therapy was prescribed, do exercises as directed.  Do not eat right before bedtime. Raise (elevate) the head of your bed to decrease heartburn.  If directed by your health care provider, eat more protein in your diet. Contact a health care provider if:  Your condition gets worse.  You have unexplained muscle weakness.  You develop a new rash.  You have a fever and cough. Get help right away if:  You have difficulty swallowing.  You develop breathing problems. Summary  Dermatomyositis is a rare muscle disease that causes muscle weakness and a rash.  You may have a combination of muscle weakness, pain, and swelling.  Treatment may include medicines and physical therapy.  Wear protective clothing and high-protection sunscreen when you go outside. Areas that are affected by a rash are more sensitive to the sun. This information is not intended to replace advice given to you by your health care provider. Make sure you discuss any questions you have with your health care provider. Document Revised: 09/23/2017 Document Reviewed: 11/02/2016 Elsevier Patient Education  2020 [employer].

## 2020-10-28 NOTE — Progress Notes (Unsigned)
Office Visit Note  Patient: Evan Burns             Date of Birth: 07-24-1981           MRN: [id number]             PCP: Binnie Rail, MD Referring: Binnie Rail, MD Visit Date: 10/28/2020 Occupation: Special educational needs teacher  Subjective:  Follow-up   History of Present Illness: Evan Burns is a 40 y.o. male here for follow up of dermatomyositis. He is now seeing physical therapy for his right hip pain and weakness making some progress with this. His strength is doing well outside of some continued hip limitations. He has not noticed significant change in skin lesions. He does continue to have some persistent fatigue. Lab tests at previous visit demonstrated an increase in CK level of 738 from 435 a month earlier, with still normal sedimentation rate.   Review of Systems  Constitutional: Positive for fatigue.  HENT: Negative for mouth sores, mouth dryness and nose dryness.   Eyes: Negative for pain, itching, visual disturbance and dryness.  Respiratory: Negative for cough, hemoptysis, shortness of breath and difficulty breathing.   Cardiovascular: Negative for chest pain, palpitations and swelling in legs/feet.  Gastrointestinal: Negative for abdominal pain, blood in stool, constipation and diarrhea.  Endocrine: Negative for increased urination.  Genitourinary: Negative for painful urination.  Musculoskeletal: Positive for arthralgias, joint pain, joint swelling, myalgias, muscle weakness, morning stiffness, muscle tenderness and myalgias.  Skin: Positive for color change, rash, nodules/bumps and redness.  Allergic/Immunologic: Negative for susceptible to infections.  Neurological: Positive for headaches and weakness. Negative for dizziness, numbness and memory loss.  Hematological: Negative for swollen glands.  Psychiatric/Behavioral: Negative for confusion and sleep disturbance.    PMFS History:  Patient Active Problem List   Diagnosis Date Noted  . Pain in left  ankle and joints of left foot 10/02/2020  . 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 Arthritis of right hip 09/16/2020  . Tinea cruris 07/28/2020  . Leg pain, bilateral 07/28/2020  . Prediabetes 03/19/2020  . Subcutaneous mass of finger of left hand 07/27/2019  . Nodule of skin of finger of left hand 07/12/2019  . Chest mass 02/14/2019  . Other chest pain 02/14/2019  . Superficial phlebitis, chest wall 02/14/2019  . Fatigue 11/22/2018  . Essential hypertension 10/12/2010  . Dermatomyositis ([place]) 06/17/2008  . Asthma 02/22/2007    Past Medical History:  Diagnosis Date  . Arthritis of right hip 09/16/2020   X-ray October 2021  . Asthma   . Dermatomyositis ([place])   . Hypertension     Family History  Problem Relation Age of Onset  . Hypertension Mother   . Diabetes Mother   . Kidney disease Mother   . Hyperlipidemia Mother   . Arthritis Mother   . Hypertension Father   . Hyperlipidemia Father    Past Surgical History:  Procedure Laterality Date  . HAND SURGERY    . INCISION AND DRAINAGE OF WOUND    . MASS EXCISION Left 07/27/2019   Procedure: LEFT LONG FINGER NODULE EXCISION;  Surgeon: Leandrew Koyanagi, MD;  Location: [facility];  Service: Orthopedics;  Laterality: Left;  . TYMPANOSTOMY TUBE PLACEMENT     Social History   Social History Narrative   Pt lives at home w/ parents       Very active at work - works two jobs   Immunization History  Administered Date(s) Administered  . Influenza  Split 08/25/2011, 07/25/2012  . Influenza Whole 08/08/2007, 08/14/2008, 07/23/2009, 07/21/2010  . Influenza,inj,Quad PF,6+ Mos 07/24/2013, 07/23/2014, 07/01/2015, 07/05/2016, 08/06/2017, 08/04/2018, 09/07/2019, 07/28/2020  . PFIZER SARS-COV-2 Vaccination 01/07/2020, 01/28/2020, 09/22/2020  . Pneumococcal Polysaccharide-23 10/01/2014  . Tdap 09/13/2013, 04/18/2019     Objective: Vital Signs: BP 126/78 (BP Location: Right Arm, Patient Position: Sitting, Cuff Size: Normal)    Pulse 80   Ht _0  (1.702 m)   Wt 252 lb 4.8 oz (114.4 kg)   BMI 39.52 kg/m    Physical Exam Eyes:     Conjunctiva/sclera: Conjunctivae normal.  Cardiovascular:     Rate and Rhythm: Normal rate and regular rhythm.  Pulmonary:     Effort: Pulmonary effort is normal.     Breath sounds: Normal breath sounds.  Skin:    General: Skin is warm and dry.     Comments: Extensive areas of hyperpigmentation, numerous calcified nodules over extensor surfaces of arms and dorsum of hands  Neurological:     Mental Status: He is alert.     Comments: Right hip flexion 4/5 strength normal throughout elsewhere     Musculoskeletal Exam:  Shoulder, elbow, wrist, fingers full range of motion no tenderness or swelling Knees bilateral patellofemoral crepitus    Investigation: No additional findings.  Imaging: XR Ankle 2 Views Left  Result Date: 10/02/2020 Xray left ankle 2 views Anterior and posterior osteophytes of tibiotalar joint with mostly preserved joint space. Mild spurring on dorsal midfoot. No erosions seen. Bone mineralization appears normal. Some soft tissue swelling overlying ankle, more anterior than posterior. Impression Moderate OA of left ankle joint   Recent Labs: Lab Results  Component Value Date   WBC 3.9 (L) 08/19/2020   HGB 13.9 08/19/2020   PLT 232.0 08/19/2020   NA 138 07/28/2020   K 3.9 07/28/2020   CL 106 07/28/2020   CO2 26 07/28/2020   GLUCOSE 101 (H) 07/28/2020   BUN 17 07/28/2020   CREATININE 0.97 07/28/2020   BILITOT 0.3 07/28/2020   ALKPHOS 41 07/28/2020   AST 22 07/28/2020   ALT 31 07/28/2020   PROT 7.0 10/02/2020   ALBUMIN 4.1 07/28/2020   CALCIUM 8.7 07/28/2020   GFRAA  02/01/2010    >60        The eGFR has been calculated using the MDRD equation. This calculation has not been validated in all clinical situations. eGFR's persistently <60 mL/min signify possible Chronic Kidney Disease.    Speciality Comments: No specialty comments  available.  Procedures:  No procedures performed Allergies: Sulfonamide derivatives, Cephalexin, and Lisinopril   Assessment / Plan:     Visit Diagnoses: Calcinosis cutis  There is more evidence of skin disease than muscle disease although largely appears to be chronic changes. Switching to diltiazem as a CCB may provide some efficacy in limiting further calcium deposition. Will contact PCP regarding this since HTN currently controlled and combination therapy with amlodipine increases risk for side effects.  Dermatomyositis ([place]) - Plan: CK, Aldolase  Not clear that there is active muscle inflammation by history and exam but increased CK. He may have a slightly high baseline. If remains elevated on repeat will recommend trial of low dose steroid treatment would expect this to affect values if active inflammation present.   Orders: Orders Placed This Encounter  Procedures  . CK  . Aldolase   No orders of the defined types were placed in this encounter.   Follow-Up Instructions: Return in about 4 weeks (around 11/25/2020) for Dermatomyositis.  Collier Salina, MD  Note - This record has been created using [employer].  Chart creation errors have been sought, but may not always  have been located. Such creation errors do not reflect on  the standard of medical care.

## 2020-10-29 LAB — ALDOLASE: Aldolase: 8.8 U/L — ABNORMAL HIGH (ref <=8.1)

## 2020-10-29 LAB — CK: Total CK: 385 U/L — ABNORMAL HIGH (ref 44–196)

## 2020-11-03 NOTE — Progress Notes (Signed)
° °  I, Christoper Fabian, LAT, ATC, am serving as scribe for Dr. Clementeen Graham.  Evan Burns is a 40 y.o. male who presents to [facility] today for f/u of R groin and B lower leg pain.  He was last seen by Dr. Denyse Amass on 09/16/20 for f/u of R groin and B lower leg pain and also reported R shoulder and L ankle pain.  He had a rheumatology appt on 10/02/20, and 10/28/20 and has completed 3 PT sessions.  Since his last visit, pt reports that his L ankle and R shoulder are better.  He notes intermittent B lower leg pain and reports that his R hip/groin is more manageable.  He reports 60% improvement in his R hip.   Diagnostic testing: L ankle XR- 10/02/20; R hip XR- 08/19/20; R shoulder XR- 08/12/20  Pertinent review of systems: No fever or chills  Relevant historical information: Dermatomyositis   Exam:  BP 130/86 (BP Location: Left Arm, Patient Position: Sitting, Cuff Size: Large)    Pulse 80    Ht 5\' 7"  (1.702 m)    Wt 254 lb 12.8 oz (115.6 kg)    SpO2 96%    BMI 39.91 kg/m  General: Well Developed, well nourished, and in no acute distress.   MSK: Right hip normal-appearing nontender decreased abduction to internal rotation flexion but not very painful. Normal gait    Assessment and Plan: 40 y.o. male with hip pain due to hip arthritis and hip stabilizer weakness and tendinitis.  This has improved with physical therapy and I think will continue to improve with a bit more PT.  Ultimately he probably will require hip replacement however would like to delay that as much as possible because he is only 40 years old.  For now plan for watchful waiting with continued PT and home exercise program.  Happy to proceed with intra-articular steroid injection into the hip if needed in the future however would like to avoid that if possible.  As for his other MSK related issues also will proceed with home exercise program and PT and a bit of watchful waiting.  Recheck back with  me as needed.   Discussed warning signs or symptoms. Please see discharge instructions. Patient expresses understanding.   The above documentation has been reviewed and is accurate and complete 20, M.D.  Total encounter time 20 minutes including face-to-face time with the patient and, reviewing past medical record, and charting on the date of service.   Discussed treatment plan and options going forward.

## 2020-11-04 ENCOUNTER — Other Ambulatory Visit: Payer: Self-pay

## 2020-11-04 ENCOUNTER — Ambulatory Visit (INDEPENDENT_AMBULATORY_CARE_PROVIDER_SITE_OTHER): Payer: Federal, State, Local not specified - PPO | Admitting: Family Medicine

## 2020-11-04 ENCOUNTER — Ambulatory Visit: Payer: Self-pay

## 2020-11-04 ENCOUNTER — Encounter: Payer: Self-pay | Admitting: Family Medicine

## 2020-11-04 VITALS — BP 130/86 | HR 80 | Ht 67.0 in | Wt 254.8 lb

## 2020-11-04 DIAGNOSIS — M3390 Dermatopolymyositis, unspecified, organ involvement unspecified: Secondary | ICD-10-CM

## 2020-11-04 DIAGNOSIS — R1031 Right lower quadrant pain: Secondary | ICD-10-CM

## 2020-11-04 NOTE — Patient Instructions (Signed)
Thank you for coming in today.  Recheck with me as needed.   I recommend that you continue PT.   Rheumatology will probably be helpful.    Let me know if you have questions.

## 2020-11-07 MED ORDER — PREDNISONE 10 MG PO TABS: ORAL_TABLET | ORAL | 0 refills | Status: AC

## 2020-11-07 NOTE — Progress Notes (Signed)
Spoke with Evan Burns about recent labs show continued mildly elevated CK and aldolase levels. Based on this I suspect some ongoing inflammation so will recommend a trial of steroid medication treatment for now and follow up to check for response in about 2-3 weeks. If a significant change is seen would recommend some kind of DMARD treatment going forwards for inflammation. If no change this may represent high baseline and would just aim to treat for cutaneous disease.

## 2020-11-07 NOTE — Addendum Note (Signed)
Addended by: Fuller Plan on: 11/07/2020 04:49 PM   Modules accepted: Orders

## 2020-11-24 NOTE — Progress Notes (Deleted)
Office Visit Note  Patient: Evan Burns             Date of Birth: 09-10-1981           MRN: [id number]             PCP: Binnie Rail, MD Referring: Binnie Rail, MD Visit Date: 11/25/2020 Occupation: '@GUAROCC' @  Subjective:  No chief complaint on file.   History of Present Illness: Evan Burns is a 40 y.o. male ***    History of Present Illness: Evan Burns is a 40 y.o. male here for follow up of dermatomyositis. He is now seeing physical therapy for his right hip pain and weakness making some progress with this. His strength is doing well outside of some continued hip limitations. He has not noticed significant change in skin lesions. He does continue to have some persistent fatigue. Lab tests at previous visit demonstrated an increase in CK level of 738 from 435 a month earlier, with still normal sedimentation rate.  Started prednisone low dose based on mild CK elevation to see if this will improve some residual inflammation with plan for trial of DMARD treatment if so.   No Rheumatology ROS completed.   PMFS History:  Patient Active Problem List   Diagnosis Date Noted  . Pain in left ankle and joints of left foot 10/02/2020  . 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 Arthritis of right hip 09/16/2020  . Tinea cruris 07/28/2020  . Leg pain, bilateral 07/28/2020  . Prediabetes 03/19/2020  . Subcutaneous mass of finger of left hand 07/27/2019  . Nodule of skin of finger of left hand 07/12/2019  . Chest mass 02/14/2019  . Other chest pain 02/14/2019  . Superficial phlebitis, chest wall 02/14/2019  . Fatigue 11/22/2018  . Essential hypertension 10/12/2010  . Dermatomyositis ([place]) 06/17/2008  . Asthma 02/22/2007    Past Medical History:  Diagnosis Date  . Arthritis of right hip 09/16/2020   X-ray October 2021  . Asthma   . Dermatomyositis ([place])   . Hypertension     Family History  Problem Relation Age of Onset  . Hypertension Mother   . Diabetes Mother    . Kidney disease Mother   . Hyperlipidemia Mother   . Arthritis Mother   . Hypertension Father   . Hyperlipidemia Father    Past Surgical History:  Procedure Laterality Date  . HAND SURGERY    . INCISION AND DRAINAGE OF WOUND    . MASS EXCISION Left 07/27/2019   Procedure: LEFT LONG FINGER NODULE EXCISION;  Surgeon: Leandrew Koyanagi, MD;  Location: [facility];  Service: Orthopedics;  Laterality: Left;  . TYMPANOSTOMY TUBE PLACEMENT     Social History   Social History Narrative   Pt lives at home w/ parents       Very active at work - works two jobs   Immunization History  Administered Date(s) Administered  . Influenza Split 08/25/2011, 07/25/2012  . Influenza Whole 08/08/2007, 08/14/2008, 07/23/2009, 07/21/2010  . Influenza,inj,Quad PF,6+ Mos 07/24/2013, 07/23/2014, 07/01/2015, 07/05/2016, 08/06/2017, 08/04/2018, 09/07/2019, 07/28/2020  . PFIZER(Purple Top)SARS-COV-2 Vaccination 01/07/2020, 01/28/2020, 09/22/2020  . Pneumococcal Polysaccharide-23 10/01/2014  . Tdap 09/13/2013, 04/18/2019     Objective: Vital Signs: There were no vitals taken for this visit.   Physical Exam   Musculoskeletal Exam: ***  CDAI Exam: CDAI Score: -- Patient Global: --; Provider Global: -- Swollen: --; Tender: -- Joint Exam 11/25/2020   No joint exam has been  documented for this visit   There is currently no information documented on the homunculus. Go to the Rheumatology activity and complete the homunculus joint exam.  Investigation: No additional findings.  Imaging: No results found.  Recent Labs: Lab Results  Component Value Date   WBC 3.9 (L) 08/19/2020   HGB 13.9 08/19/2020   PLT 232.0 08/19/2020   NA 138 07/28/2020   K 3.9 07/28/2020   CL 106 07/28/2020   CO2 26 07/28/2020   GLUCOSE 101 (H) 07/28/2020   BUN 17 07/28/2020   CREATININE 0.97 07/28/2020   BILITOT 0.3 07/28/2020   ALKPHOS 41 07/28/2020   AST 22 07/28/2020   ALT 31 07/28/2020   PROT 7.0  10/02/2020   ALBUMIN 4.1 07/28/2020   CALCIUM 8.7 07/28/2020   GFRAA  02/01/2010    >60        The eGFR has been calculated using the MDRD equation. This calculation has not been validated in all clinical situations. eGFR's persistently <60 mL/min signify possible Chronic Kidney Disease.    Speciality Comments: No specialty comments available.  Procedures:  No procedures performed Allergies: Sulfonamide derivatives, Cephalexin, and Lisinopril   Assessment / Plan:     Visit Diagnoses: No diagnosis found.  Orders: No orders of the defined types were placed in this encounter.  No orders of the defined types were placed in this encounter.   Face-to-face time spent with patient was *** minutes. Greater than 50% of time was spent in counseling and coordination of care.  Follow-Up Instructions: No follow-ups on file.   Collier Salina, MD  Note - This record has been created using [employer].  Chart creation errors have been sought, but may not always  have been located. Such creation errors do not reflect on  the standard of medical care.

## 2020-11-25 ENCOUNTER — Ambulatory Visit: Payer: Federal, State, Local not specified - PPO | Admitting: Internal Medicine

## 2020-12-25 ENCOUNTER — Ambulatory Visit
Admission: EM | Admit: 2020-12-25 | Discharge: 2020-12-25 | Disposition: A | Discharge status: Home / Self Care | Payer: Federal, State, Local not specified - PPO | Attending: Family Medicine | Admitting: Family Medicine

## 2020-12-25 ENCOUNTER — Other Ambulatory Visit: Payer: Self-pay

## 2020-12-25 ENCOUNTER — Encounter: Payer: Self-pay | Admitting: Emergency Medicine

## 2020-12-25 DIAGNOSIS — M79672 Pain in left foot: Secondary | ICD-10-CM | POA: Diagnosis not present

## 2020-12-25 MED ORDER — PREDNISONE 20 MG PO TABS: 40.0000 mg | ORAL_TABLET | Freq: Every day | ORAL | 0 refills | Status: DC

## 2020-12-25 NOTE — ED Triage Notes (Addendum)
Left foot pain started Tuesday.  No known injury.  Pain in instep area.  Can feel some uncomfortable feeling non-weight bearing.  Patient reports pain magnified with weight bearing

## 2020-12-25 NOTE — ED Provider Notes (Signed)
[name]    CSN: [id number] Arrival date & time: 12/25/20  0806      History   Chief Complaint Chief Complaint  Patient presents with  . Foot Pain    HPI Evan Burns is a 40 y.o. male.   Patient here today with about a week of left foot pain that starts at the sole of his foot and extends back toward the posterior heel.  States it was worst yesterday, mildly eased up today but still very painful with weightbearing.  Denies swelling, discoloration, heat, injury to the area, calf soreness.  Stands on his feet on concrete all day at work every day.  Does try to wear supportive shoes.  So far has tried ice and elevation without any benefit.  Denies history of foot issues.     Past Medical History:  Diagnosis Date  . Arthritis of right hip 09/16/2020   X-ray October 2021  . Asthma   . Dermatomyositis ([facility])   . Hypertension     Patient Active Problem List   Diagnosis Date Noted  . Pain in left ankle and joints of left foot 10/02/2020  . 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 Arthritis of right hip 09/16/2020  . Tinea cruris 07/28/2020  . Leg pain, bilateral 07/28/2020  . Prediabetes 03/19/2020  . Subcutaneous mass of finger of left hand 07/27/2019  . Nodule of skin of finger of left hand 07/12/2019  . Chest mass 02/14/2019  . Other chest pain 02/14/2019  . Superficial phlebitis, chest wall 02/14/2019  . Fatigue 11/22/2018  . Essential hypertension 10/12/2010  . Dermatomyositis ([facility]) 06/17/2008  . Asthma 02/22/2007    Past Surgical History:  Procedure Laterality Date  . HAND SURGERY    . INCISION AND DRAINAGE OF WOUND    . MASS EXCISION Left 07/27/2019   Procedure: LEFT LONG FINGER NODULE EXCISION;  Surgeon: Tarry Kos, MD;  Location: [facility];  Service: Orthopedics;  Laterality: Left;  . TYMPANOSTOMY TUBE PLACEMENT         Home Medications    Prior to Admission medications   Medication Sig Start Date End Date Taking?  Authorizing Provider  amLODipine (NORVASC) 10 MG tablet Take 1 tablet (10 mg total) by mouth daily. 07/28/20  Yes Burns, Bobette Mo, MD  budesonide-formoterol (SYMBICORT) 160-4.5 MCG/ACT inhaler Inhale 2 puffs into the lungs 2 (two) times daily. 07/28/20  Yes Burns, Bobette Mo, MD  COD LIVER OIL PO Take 1 capsule by mouth daily.   Yes [provider]  losartan (COZAAR) 100 MG tablet take 1 tablet by mouth once daily. 07/28/20  Yes Burns, Bobette Mo, MD  predniSONE (DELTASONE) 20 MG tablet Take 2 tablets (40 mg total) by mouth daily with breakfast. 12/25/20  Yes Particia Nearing, PA-C  albuterol (VENTOLIN HFA) 108 (90 Base) MCG/ACT inhaler Inhale 2 puffs into the lungs every 6 (six) hours as needed for wheezing or shortness of breath. 07/30/20   [name], MD  Butenafine HCl 1 % cream Apply twice daily to affected area 07/28/20   [name], MD  Creatine POWD Take 1 Scoop by mouth.    [provider]  hydrocortisone 2.5 % cream Apply topically. 05/01/20   [provider]  naproxen (NAPROSYN) 500 MG tablet Take 1 tablet (500 mg total) by mouth 2 (two) times daily. Patient taking differently: Take 500 mg by mouth as needed. 08/12/20   Hall-Potvin, [place], PA-C  Omega-3 Fatty Acids (FISH OIL) 1000  MG CAPS Take 1,000 mg by mouth daily.    [provider]    Family History Family History  Problem Relation Age of Onset  . Hypertension Mother   . Diabetes Mother   . Kidney disease Mother   . Hyperlipidemia Mother   . Arthritis Mother   . Hypertension Father   . Hyperlipidemia Father     Social History Social History   Tobacco Use  . Smoking status: Never Smoker  . Smokeless tobacco: Never Used  Vaping Use  . Vaping Use: Never used  Substance Use Topics  . Alcohol use: No  . Drug use: No     Allergies   Sulfonamide derivatives, Cephalexin, and Lisinopril   Review of Systems Review of Systems Per HPI  Physical Exam Triage Vital Signs ED  Triage Vitals  Enc Vitals Group     BP 12/25/20 0817 130/86     Pulse Rate 12/25/20 0817 91     Resp 12/25/20 0817 20     Temp 12/25/20 0817 98.6 F (37 C)     Temp Source 12/25/20 0817 Oral     SpO2 12/25/20 0817 95 %     Weight --      Height --      Head Circumference --      Peak Flow --      Pain Score 12/25/20 0818 7     Pain Loc --      Pain Edu? --      Excl. in GC? --    No data found.  Updated Vital Signs BP 130/86 (BP Location: Left Arm)   Pulse 91   Temp 98.6 F (37 C) (Oral)   Resp 20   SpO2 95%   Visual Acuity Right Eye Distance:   Left Eye Distance:   Bilateral Distance:    Right Eye Near:   Left Eye Near:    Bilateral Near:     Physical Exam Vitals and nursing note reviewed.  Constitutional:      Appearance: Normal appearance.  HENT:     Head: Atraumatic.     Mouth/Throat:     Mouth: Mucous membranes are moist.  Eyes:     Extraocular Movements: Extraocular movements intact.     Conjunctiva/sclera: Conjunctivae normal.  Cardiovascular:     Rate and Rhythm: Normal rate and regular rhythm.     Pulses: Normal pulses.  Pulmonary:     Effort: Pulmonary effort is normal.     Breath sounds: Normal breath sounds.  Musculoskeletal:        General: No swelling, tenderness or signs of injury. Normal range of motion.     Cervical back: Normal range of motion and neck supple.     Comments: Sole of left foot nontender to palpation Mildly antalgic gait, favoring left foot.  Skin:    General: Skin is warm and dry.     Findings: No bruising, erythema or rash.  Neurological:     General: No focal deficit present.     Mental Status: He is oriented to person, place, and time.     Sensory: No sensory deficit.  Psychiatric:        Mood and Affect: Mood normal.        Thought Content: Thought content normal.        Judgment: Judgment normal.      UC Treatments / Results  Labs (all labs ordered are listed, but only abnormal results are  displayed) Labs Reviewed -  No data to display  EKG   Radiology No results found.  Procedures Procedures (including critical care time)  Medications Ordered in UC Medications - No data to display  Initial Impression / Assessment and Plan / UC Course  I have reviewed the triage vital signs and the nursing notes.  Pertinent labs & imaging results that were available during my care of the patient were reviewed by me and considered in my medical decision making (see chart for details).     Suspect some fascial inflammation and irritation, likely from insufficient arch support in shoes exacerbated by standing on concrete all day for work every day.  We will treat with prednisone burst, rolling foot along frozen water bottle, calf stretches, over-the-counter pain relievers.  Discussed applying arch supports to his work shoes daily.  Return if symptoms not resolving. Final Clinical Impressions(s) / UC Diagnoses   Final diagnoses:  Left foot pain     Discharge Instructions     Wear supportive inserts in your shoes to improve arch support, roll the bottoms of your feet on frozen water bottles and stretch your calf muscles regularly.  Massage and Epsom salt soaks in warm water are also very helpful    ED Prescriptions    Medication Sig Dispense Auth. Provider   predniSONE (DELTASONE) 20 MG tablet Take 2 tablets (40 mg total) by mouth daily with breakfast. 10 tablet Particia Nearing, [place]     PDMP not reviewed this encounter.   Roosvelt Maser [place], [place] 12/25/20 [phone]

## 2020-12-25 NOTE — Discharge Instructions (Signed)
Wear supportive inserts in your shoes to improve arch support, roll the bottoms of your feet on frozen water bottles and stretch your calf muscles regularly.  Massage and Epsom salt soaks in warm water are also very helpful

## 2021-01-06 ENCOUNTER — Encounter: Payer: Self-pay | Admitting: Internal Medicine

## 2021-01-08 NOTE — Progress Notes (Signed)
Subjective:    Patient ID: Evan Burns, male    DOB: April 26, 1981, 40 y.o.   MRN: [id number]  HPI The patient is here for an acute visit.    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  He would like to get FMLA.  Some of the reasons he feels he needs FMLA: His ankles swell up for him from all the walking and they are painful.  It takes 2-3 days for them to decrease.  It happened frequently last year.      He sometimes has right hip pain.  He has arthritis.  It comes and goes.  He has seen sports med.  He takes ibuprofen.  It has not gotten as severe to the point he can not do what he needs to do.  Pain today 7/10.     He sometimes has right knee pain.    He also has dermatomyositis and is following with rheumatology.  He recently took steroids and there is no improvement of his pain.  Rheumatologist did state that if there was no improvement most likely he did not need to be on anything and that he would just need to cutaneous heart of the disease treated.  He has not followed up with them yet.    Medications and allergies reviewed with patient and updated if appropriate.  Patient Active Problem List   Diagnosis Date Noted  . Pain in left ankle and joints of left foot 10/02/2020  . 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 Arthritis of right hip 09/16/2020  . Tinea cruris 07/28/2020  . Leg pain, bilateral 07/28/2020  . Prediabetes 03/19/2020  . Subcutaneous mass of finger of left hand 07/27/2019  . Nodule of skin of finger of left hand 07/12/2019  . Chest mass 02/14/2019  . Other chest pain 02/14/2019  . Superficial phlebitis, chest wall 02/14/2019  . Fatigue 11/22/2018  . Essential hypertension 10/12/2010  . Dermatomyositis ([facility]) 06/17/2008  . Asthma 02/22/2007    Current Outpatient Medications on File Prior to Visit  Medication Sig Dispense Refill  . albuterol (VENTOLIN HFA) 108 (90 Base) MCG/ACT inhaler Inhale 2 puffs into the lungs every 6 (six) hours as needed for wheezing or  shortness of breath. 18 g 1  . budesonide-formoterol (SYMBICORT) 160-4.5 MCG/ACT inhaler Inhale 2 puffs into the lungs 2 (two) times daily. 1 each 8  . Butenafine HCl 1 % cream Apply twice daily to affected area 30 g 0  . COD LIVER OIL PO Take 1 capsule by mouth daily.    . Creatine POWD Take 1 Scoop by mouth.    . hydrocortisone 2.5 % cream Apply topically.    Marland Kitchen losartan (COZAAR) 100 MG tablet take 1 tablet by mouth once daily. 90 tablet 1  . naproxen (NAPROSYN) 500 MG tablet Take 1 tablet (500 mg total) by mouth 2 (two) times daily. (Patient taking differently: Take 500 mg by mouth as needed.) 30 tablet 0  . Omega-3 Fatty Acids (FISH OIL) 1000 MG CAPS Take 1,000 mg by mouth daily.     No current facility-administered medications on file prior to visit.    Past Medical History:  Diagnosis Date  . Arthritis of right hip 09/16/2020   X-ray October 2021  . Asthma   . Dermatomyositis ([facility])   . Hypertension     Past Surgical History:  Procedure Laterality Date  . HAND SURGERY    . INCISION AND DRAINAGE OF WOUND    . MASS EXCISION Left 07/27/2019  Procedure: LEFT LONG FINGER NODULE EXCISION;  Surgeon: Tarry Kos, MD;  Location: [facility];  Service: Orthopedics;  Laterality: Left;  . TYMPANOSTOMY TUBE PLACEMENT      Social History   Socioeconomic History  . Marital status: Married    Spouse name: Not on file  . Number of children: Not on file  . Years of education: Not on file  . Highest education level: Not on file  Occupational History  . Not on file  Tobacco Use  . Smoking status: Never Smoker  . Smokeless tobacco: Never Used  Vaping Use  . Vaping Use: Never used  Substance and Sexual Activity  . Alcohol use: No  . Drug use: No  . Sexual activity: Not on file  Other Topics Concern  . Not on file  Social History Narrative   Pt lives at home w/ parents       Very active at work - works two jobs   Social Determinants of Research scientist (physical sciences) Strain: Not on [employer] Insecurity: Not on file  Transportation Needs: Not on file  Physical Activity: Not on file  Stress: Not on file  Social Connections: Not on file    Family History  Problem Relation Age of Onset  . Hypertension Mother   . Diabetes Mother   . Kidney disease Mother   . Hyperlipidemia Mother   . Arthritis Mother   . Hypertension Father   . Hyperlipidemia Father     Review of Systems  Constitutional: Positive for fatigue. Negative for fever.  Respiratory: Negative for shortness of breath.   Cardiovascular: Positive for leg swelling. Negative for chest pain and palpitations.  Musculoskeletal: Positive for arthralgias (Right hip, right knee) and myalgias.       Objective:   Vitals:   01/09/21 1314  BP: (!) 140/92  Pulse: 80  Temp: 98.4 F (36.9 C)  SpO2: 98%   BP Readings from Last 3 Encounters:  01/09/21 (!) 140/92  12/25/20 130/86  11/04/20 130/86   Wt Readings from Last 3 Encounters:  01/09/21 251 lb (113.9 kg)  11/04/20 254 lb 12.8 oz (115.6 kg)  10/28/20 252 lb 4.8 oz (114.4 kg)   Body mass index is 39.31 kg/m.   Physical Exam    Constitutional: Appears well-developed and well-nourished. No distress.  Head: Normocephalic and atraumatic.  Neck: Neck supple. No tracheal deviation present. No thyromegaly present.  No cervical lymphadenopathy Cardiovascular: Normal rate, regular rhythm and normal heart sounds.  No murmur heard. No carotid bruit .  1 + nonpitting b/l LE edema Pulmonary/Chest: Effort normal and breath sounds normal. No respiratory distress. No has no wheezes. No rales.  Skin: Skin is warm and dry. Not diaphoretic.  Psychiatric: Normal mood and affect. Behavior is normal.       Assessment & Plan:   Regarding the FMLA-I agreed to do FMLA and will do it for 6 months for his hip pain.  At this point his leg swelling and right knee pain do not qualify for FMLA.  Advised that at this time I am not sure if his  dermatomyositis qualifies or not-he will need to follow-up with Dr. Dimple Casey for that and have something documented.  FMLA- --will give 1-2 days/month for flares of hip arthritis   See Problem List for Assessment and Plan of chronic medical problems.    This visit occurred during the SARS-CoV-2 public health emergency.  Safety protocols were in place, including screening questions  prior to the visit, additional usage of staff PPE, and extensive cleaning of exam room while observing appropriate contact time as indicated for disinfecting solutions.

## 2021-01-09 ENCOUNTER — Encounter: Payer: Self-pay | Admitting: Internal Medicine

## 2021-01-09 ENCOUNTER — Ambulatory Visit: Payer: Federal, State, Local not specified - PPO | Admitting: Internal Medicine

## 2021-01-09 ENCOUNTER — Other Ambulatory Visit: Payer: Self-pay

## 2021-01-09 VITALS — BP 140/92 | HR 80 | Temp 98.4°F | Ht 67.0 in | Wt 251.0 lb

## 2021-01-09 DIAGNOSIS — I10 Essential (primary) hypertension: Secondary | ICD-10-CM | POA: Diagnosis not present

## 2021-01-09 DIAGNOSIS — R600 Localized edema: Secondary | ICD-10-CM | POA: Diagnosis not present

## 2021-01-09 DIAGNOSIS — M3390 Dermatopolymyositis, unspecified, organ involvement unspecified: Secondary | ICD-10-CM

## 2021-01-09 DIAGNOSIS — M1611 Unilateral primary osteoarthritis, right hip: Secondary | ICD-10-CM

## 2021-01-09 MED ORDER — DILTIAZEM HCL ER 240 MG PO CP24: 240.0000 mg | ORAL_CAPSULE | Freq: Every day | ORAL | 5 refills | Status: DC

## 2021-01-09 MED ORDER — HYDROCHLOROTHIAZIDE 25 MG PO TABS: 25.0000 mg | ORAL_TABLET | Freq: Every day | ORAL | 3 refills | Status: DC

## 2021-01-09 NOTE — Assessment & Plan Note (Addendum)
Chronic No improvement with steroids Advised following up with Dr. Dimple Casey

## 2021-01-09 NOTE — Patient Instructions (Addendum)
  Medications changes include :   Stop amlodipine and start diltiazem 240 mg daily and hydrochlorothiazide 25 mg daily.   Your prescription(s) have been submitted to your pharmacy. Please take as directed and contact our office if you believe you are having problem(s) with the medication(s).

## 2021-01-09 NOTE — Assessment & Plan Note (Addendum)
Chronic, intermittent He states some lower leg edema after being on his feet and walking a lot Possibly venous insufficiency, possibly related to amlodipine Will discontinue amlodipine Start hydrochlorothiazide 25 mg daily May need to wear compression socks

## 2021-01-09 NOTE — Assessment & Plan Note (Signed)
Chronic Following with sports medicine-Dr. Denyse Amass Taking ibuprofen as needed This does flareup intermittently making his job difficult because his job involves a lot of walking needs FMLA to help him when the pain is severe

## 2021-01-09 NOTE — Assessment & Plan Note (Signed)
Chronic Rheumatology advised switching from amlodipine to diltiazem to prevent further cutaneous calcinosis Stop amlodipine Start 10 to 40 mg daily Continue losartan 100 mg daily His blood pressure often is not ideally controlled so I will also start hydrochlorothiazide 25 mg daily since he has frequent leg swelling, which is new not related to joint pain Advised him to monitor his blood pressure at home and update me with his values-return if it is not controlled

## 2021-01-12 NOTE — Telephone Encounter (Signed)
Forms have bene completed &Placed in providers box to review and sign.

## 2021-01-13 DIAGNOSIS — Z0279 Encounter for issue of other medical certificate: Secondary | ICD-10-CM

## 2021-01-13 NOTE — Telephone Encounter (Signed)
Forms have been signed, Faxed, Copy sent to scan &Charged for.   MyChart message sent to inform patient.

## 2021-03-19 DIAGNOSIS — E559 Vitamin D deficiency, unspecified: Secondary | ICD-10-CM | POA: Insufficient documentation

## 2021-03-19 NOTE — Progress Notes (Signed)
Subjective:    Patient ID: Evan Burns, male    DOB: 1981/09/15, 40 y.o.   MRN: [id number]  HPI He is here for a physical exam.     Medications and allergies reviewed with patient and updated if appropriate.  Patient Active Problem List   Diagnosis Date Noted  . Vitamin D deficiency 03/19/2021  . Lower leg edema 01/09/2021  . Pain in left ankle and joints of left foot 10/02/2020  . 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 Arthritis of right hip 09/16/2020  . Tinea cruris 07/28/2020  . Leg pain, bilateral 07/28/2020  . Prediabetes 03/19/2020  . Subcutaneous mass of finger of left hand 07/27/2019  . Nodule of skin of finger of left hand 07/12/2019  . Chest mass 02/14/2019  . Other chest pain 02/14/2019  . Superficial phlebitis, chest wall 02/14/2019  . Fatigue 11/22/2018  . Essential hypertension 10/12/2010  . Dermatomyositis (HCC) 06/17/2008  . Asthma 02/22/2007    Current Outpatient Medications on File Prior to Visit  Medication Sig Dispense Refill  . albuterol (VENTOLIN HFA) 108 (90 Base) MCG/ACT inhaler Inhale 2 puffs into the lungs every 6 (six) hours as needed for wheezing or shortness of breath. 18 g 1  . budesonide-formoterol (SYMBICORT) 160-4.5 MCG/ACT inhaler Inhale 2 puffs into the lungs 2 (two) times daily. 1 each 8  . Butenafine HCl 1 % cream Apply twice daily to affected area 30 g 0  . COD LIVER OIL PO Take 1 capsule by mouth daily.    . Creatine POWD Take 1 Scoop by mouth.    . diltiazem (DILACOR XR) 240 MG 24 hr capsule Take 1 capsule (240 mg total) by mouth daily. 30 capsule 5  . hydrochlorothiazide (HYDRODIURIL) 25 MG tablet Take 1 tablet (25 mg total) by mouth daily. 90 tablet 3  . hydrocortisone 2.5 % cream Apply topically.    Marland Kitchen losartan (COZAAR) 100 MG tablet take 1 tablet by mouth once daily. 90 tablet 1  . naproxen (NAPROSYN) 500 MG tablet Take 1 tablet (500 mg total) by mouth 2 (two) times daily. (Patient taking differently: Take 500 mg by mouth as  needed.) 30 tablet 0  . Omega-3 Fatty Acids (FISH OIL) 1000 MG CAPS Take 1,000 mg by mouth daily.     No current facility-administered medications on file prior to visit.    Past Medical History:  Diagnosis Date  . Arthritis of right hip 09/16/2020   X-ray October 2021  . Asthma   . Dermatomyositis (HCC)   . Hypertension     Past Surgical History:  Procedure Laterality Date  . HAND SURGERY    . INCISION AND DRAINAGE OF WOUND    . MASS EXCISION Left 07/27/2019   Procedure: LEFT LONG FINGER NODULE EXCISION;  Surgeon: Tarry Kos, MD;  Location: [facility];  Service: Orthopedics;  Laterality: Left;  . TYMPANOSTOMY TUBE PLACEMENT      Social History   Socioeconomic History  . Marital status: Married    Spouse name: Not on file  . Number of children: Not on file  . Years of education: Not on file  . Highest education level: Not on file  Occupational History  . Not on file  Tobacco Use  . Smoking status: Never Smoker  . Smokeless tobacco: Never Used  Vaping Use  . Vaping Use: Never used  Substance and Sexual Activity  . Alcohol use: No  . Drug use: No  . Sexual activity: Not on file  Other Topics Concern  . Not on file  Social History Narrative   Pt lives at home w/ parents       Very active at work - works two jobs   Social Determinants of Corporate investment banker Strain: Not on [employer] Insecurity: Not on file  Transportation Needs: Not on file  Physical Activity: Not on file  Stress: Not on file  Social Connections: Not on file    Family History  Problem Relation Age of Onset  . Hypertension Mother   . Diabetes Mother   . Kidney disease Mother   . Hyperlipidemia Mother   . Arthritis Mother   . Hypertension Father   . Hyperlipidemia Father     Review of Systems  Constitutional: Negative for chills and fever.  Eyes: Negative for visual disturbance.  Respiratory: Negative for cough, shortness of breath and wheezing.    Cardiovascular: Negative for chest pain, palpitations and leg swelling.  Gastrointestinal: Negative for abdominal pain, blood in stool, constipation, diarrhea and nausea.       No gerd  Genitourinary: Negative for difficulty urinating and dysuria.  Musculoskeletal: Positive for arthralgias (hip pain, right knee). Negative for back pain and myalgias.  Skin: Negative for rash.  Neurological: Negative for light-headedness and headaches.  Psychiatric/Behavioral: Negative for dysphoric mood. The patient is not nervous/anxious.        Objective:   Vitals:   03/20/21 0755  BP: 132/88  Pulse: 88  Temp: 98.1 F (36.7 C)  SpO2: 97%   Filed Weights   03/20/21 0755  Weight: 251 lb 6.4 oz (114 kg)   Body mass index is 39.37 kg/m.  BP Readings from Last 3 Encounters:  03/20/21 132/88  01/09/21 (!) 140/92  12/25/20 130/86    Wt Readings from Last 3 Encounters:  03/20/21 251 lb 6.4 oz (114 kg)  01/09/21 251 lb (113.9 kg)  11/04/20 254 lb 12.8 oz (115.6 kg)    Depression screen [facility] 2/9 03/20/2021 03/18/2020 11/22/2018 09/08/2017  Decreased Interest 0 0 0 0  Down, Depressed, Hopeless 0 0 0 0  PHQ - 2 Score 0 0 0 0  Altered sleeping 0 - - -  Tired, decreased energy 0 - - -  Change in appetite 0 - - -  Feeling bad or failure about yourself  0 - - -  Trouble concentrating 0 - - -  Moving slowly or fidgety/restless 0 - - -  Suicidal thoughts 0 - - -  PHQ-9 Score 0 - - -    GAD 7 : Generalized Anxiety Score 03/20/2021  Nervous, Anxious, on Edge 0  Control/stop worrying 0  Worry too much - different things 0  Trouble relaxing 0  Restless 0  Easily annoyed or irritable 0  Afraid - awful might happen 0  Total GAD 7 Score 0        Physical Exam Constitutional: He appears well-developed and well-nourished. No distress.  HENT:  Head: Normocephalic and atraumatic.  Right Ear: External ear normal.  Left Ear: External ear normal.  Mouth/Throat: Oropharynx is clear and moist.   Normal ear canals and TM b/l  Eyes: Conjunctivae and EOM are normal.  Neck: Neck supple. No tracheal deviation present. No thyromegaly present.  No carotid bruit  Cardiovascular: Normal rate, regular rhythm, normal heart sounds and intact distal pulses.   No murmur heard. Pulmonary/Chest: Effort normal and breath sounds normal. No respiratory distress. He has no wheezes. He has no rales.  Abdominal: Soft.  He exhibits no distension. There is no tenderness.  Genitourinary: deferred  Musculoskeletal: He exhibits no edema.  Lymphadenopathy:   He has no cervical adenopathy.  Skin: Skin is warm and dry. He is not diaphoretic.  Psychiatric: He has a normal mood and affect. His behavior is normal.         Assessment & Plan:   Physical exam: Screening blood work  ordered Immunizations  Up to date  Exercise   regular Weight  Ok for build Substance abuse  none    Screened for depression using the PHQ 9 scale.  No evidence of depression.   Screened for anxiety using GAD7 Scale.  No evidence of anxiety.   See Problem List for Assessment and Plan of chronic medical problems.   This visit occurred during the SARS-CoV-2 public health emergency.  Safety protocols were in place, including screening questions prior to the visit, additional usage of staff PPE, and extensive cleaning of exam room while observing appropriate contact time as indicated for disinfecting solutions.

## 2021-03-19 NOTE — Patient Instructions (Addendum)
Blood work was ordered.     Medications changes include :  none     Please followup in 6 months    Health Maintenance, Male Adopting a healthy lifestyle and getting preventive care are important in promoting health and wellness. Ask your health care provider about:  The right schedule for you to have regular tests and exams.  Things you can do on your own to prevent diseases and keep yourself healthy. What should I know about diet, weight, and exercise? Eat a healthy diet  Eat a diet that includes plenty of vegetables, fruits, low-fat dairy products, and lean protein.  Do not eat a lot of foods that are high in solid fats, added sugars, or sodium.   Maintain a healthy weight Body mass index (BMI) is a measurement that can be used to identify possible weight problems. It estimates body fat based on height and weight. Your health care provider can help determine your BMI and help you achieve or maintain a healthy weight. Get regular exercise Get regular exercise. This is one of the most important things you can do for your health. Most adults should:  Exercise for at least 150 minutes each week. The exercise should increase your heart rate and make you sweat (moderate-intensity exercise).  Do strengthening exercises at least twice a week. This is in addition to the moderate-intensity exercise.  Spend less time sitting. Even light physical activity can be beneficial. Watch cholesterol and blood lipids Have your blood tested for lipids and cholesterol at 39 years of age, then have this test every 5 years. You may need to have your cholesterol levels checked more often if:  Your lipid or cholesterol levels are high.  You are older than 40 years of age.  You are at high risk for heart disease. What should I know about cancer screening? Many types of cancers can be detected early and may often be prevented. Depending on your health history and family history, you may need to  have cancer screening at various ages. This may include screening for:  Colorectal cancer.  Prostate cancer.  Skin cancer.  Lung cancer. What should I know about heart disease, diabetes, and high blood pressure? Blood pressure and heart disease  High blood pressure causes heart disease and increases the risk of stroke. This is more likely to develop in people who have high blood pressure readings, are of African descent, or are overweight.  Talk with your health care provider about your target blood pressure readings.  Have your blood pressure checked: ? Every 3-5 years if you are 18-39 years of age. ? Every year if you are 40 years old or older.  If you are between the ages of 65 and 75 and are a current or former smoker, ask your health care provider if you should have a one-time screening for abdominal aortic aneurysm (AAA). Diabetes Have regular diabetes screenings. This checks your fasting blood sugar level. Have the screening done:  Once every three years after age 45 if you are at a normal weight and have a low risk for diabetes.  More often and at a younger age if you are overweight or have a high risk for diabetes. What should I know about preventing infection? Hepatitis B If you have a higher risk for hepatitis B, you should be screened for this virus. Talk with your health care provider to find out if you are at risk for hepatitis B infection. Hepatitis C Blood testing is recommended for:    Everyone born from 1945 through 1965.  Anyone with known risk factors for hepatitis C. Sexually transmitted infections (STIs)  You should be screened each year for STIs, including gonorrhea and chlamydia, if: ? You are sexually active and are younger than 39 years of age. ? You are older than 39 years of age and your health care provider tells you that you are at risk for this type of infection. ? Your sexual activity has changed since you were last screened, and you are at  increased risk for chlamydia or gonorrhea. Ask your health care provider if you are at risk.  Ask your health care provider about whether you are at high risk for HIV. Your health care provider may recommend a prescription medicine to help prevent HIV infection. If you choose to take medicine to prevent HIV, you should first get tested for HIV. You should then be tested every 3 months for as long as you are taking the medicine. Follow these instructions at home: Lifestyle  Do not use any products that contain nicotine or tobacco, such as cigarettes, e-cigarettes, and chewing tobacco. If you need help quitting, ask your health care provider.  Do not use street drugs.  Do not share needles.  Ask your health care provider for help if you need support or information about quitting drugs. Alcohol use  Do not drink alcohol if your health care provider tells you not to drink.  If you drink alcohol: ? Limit how much you have to 0-2 drinks a day. ? Be aware of how much alcohol is in your drink. In the [place] one drink equals one 12 oz bottle of beer (355 mL), one 5 oz glass of wine (148 mL), or one 1 oz glass of hard liquor (44 mL). General instructions  Schedule regular health, dental, and eye exams.  Stay current with your vaccines.  Tell your health care provider if: ? You often feel depressed. ? You have ever been abused or do not feel safe at home. Summary  Adopting a healthy lifestyle and getting preventive care are important in promoting health and wellness.  Follow your health care provider's instructions about healthy diet, exercising, and getting tested or screened for diseases.  Follow your health care provider's instructions on monitoring your cholesterol and blood pressure. This information is not intended to replace advice given to you by your health care provider. Make sure you discuss any questions you have with your health care provider. Document Revised: 10/04/2018  Document Reviewed: 10/04/2018 Elsevier Patient Education  2021 Elsevier Inc.  

## 2021-03-20 ENCOUNTER — Other Ambulatory Visit: Payer: Self-pay

## 2021-03-20 ENCOUNTER — Encounter: Payer: Self-pay | Admitting: Internal Medicine

## 2021-03-20 ENCOUNTER — Ambulatory Visit (INDEPENDENT_AMBULATORY_CARE_PROVIDER_SITE_OTHER): Payer: Federal, State, Local not specified - PPO | Admitting: Internal Medicine

## 2021-03-20 VITALS — BP 132/88 | HR 88 | Temp 98.1°F | Ht 67.0 in | Wt 251.4 lb

## 2021-03-20 DIAGNOSIS — J4520 Mild intermittent asthma, uncomplicated: Secondary | ICD-10-CM

## 2021-03-20 DIAGNOSIS — R7303 Prediabetes: Secondary | ICD-10-CM

## 2021-03-20 DIAGNOSIS — I10 Essential (primary) hypertension: Secondary | ICD-10-CM | POA: Diagnosis not present

## 2021-03-20 DIAGNOSIS — Z1159 Encounter for screening for other viral diseases: Secondary | ICD-10-CM

## 2021-03-20 DIAGNOSIS — M3390 Dermatopolymyositis, unspecified, organ involvement unspecified: Secondary | ICD-10-CM

## 2021-03-20 DIAGNOSIS — M3313 Other dermatomyositis without myopathy: Secondary | ICD-10-CM

## 2021-03-20 DIAGNOSIS — Z0000 Encounter for general adult medical examination without abnormal findings: Secondary | ICD-10-CM

## 2021-03-20 DIAGNOSIS — E559 Vitamin D deficiency, unspecified: Secondary | ICD-10-CM

## 2021-03-20 LAB — COMPREHENSIVE METABOLIC PANEL
ALT: 25 U/L (ref 0–53)
AST: 18 U/L (ref 0–37)
Albumin: 4.2 g/dL (ref 3.5–5.2)
Alkaline Phosphatase: 44 U/L (ref 39–117)
BUN: 15 mg/dL (ref 6–23)
CO2: 26 mEq/L (ref 19–32)
Calcium: 9.1 mg/dL (ref 8.4–10.5)
Chloride: 108 mEq/L (ref 96–112)
Creatinine, Ser: 0.98 mg/dL (ref 0.40–1.50)
GFR: 97.39 mL/min (ref >60.00)
Glucose, Bld: 90 mg/dL (ref 70–99)
Potassium: 4.1 mEq/L (ref 3.5–5.1)
Sodium: 140 mEq/L (ref 135–145)
Total Bilirubin: 0.3 mg/dL (ref 0.2–1.2)
Total Protein: 6.4 g/dL (ref 6.0–8.3)

## 2021-03-20 LAB — LIPID PANEL
Cholesterol: 121 mg/dL (ref 0–200)
HDL: 35.9 mg/dL — ABNORMAL LOW (ref >39.00)
LDL Cholesterol: 70 mg/dL (ref 0–99)
NonHDL: 85.24
Total CHOL/HDL Ratio: 3
Triglycerides: 74 mg/dL (ref 0.0–149.0)
VLDL: 14.8 mg/dL (ref 0.0–40.0)

## 2021-03-20 LAB — CBC WITH DIFFERENTIAL/PLATELET
Basophils Absolute: 0.1 10*3/uL (ref 0.0–0.1)
Basophils Relative: 2.5 % (ref 0.0–3.0)
Eosinophils Absolute: 0.2 10*3/uL (ref 0.0–0.7)
Eosinophils Relative: 5.4 % — ABNORMAL HIGH (ref 0.0–5.0)
HCT: 41.7 % (ref 39.0–52.0)
Hemoglobin: 13.6 g/dL (ref 13.0–17.0)
Lymphocytes Relative: 35 % (ref 12.0–46.0)
Lymphs Abs: 1.6 10*3/uL (ref 0.7–4.0)
MCHC: 32.6 g/dL (ref 30.0–36.0)
MCV: 82 fl (ref 78.0–100.0)
Monocytes Absolute: 0.4 10*3/uL (ref 0.1–1.0)
Monocytes Relative: 8.3 % (ref 3.0–12.0)
Neutro Abs: 2.2 10*3/uL (ref 1.4–7.7)
Neutrophils Relative %: 48.8 % (ref 43.0–77.0)
Platelets: 240 10*3/uL (ref 150.0–400.0)
RBC: 5.08 Mil/uL (ref 4.22–5.81)
RDW: 13.3 % (ref 11.5–15.5)
WBC: 4.5 10*3/uL (ref 4.0–10.5)

## 2021-03-20 LAB — VITAMIN D 25 HYDROXY (VIT D DEFICIENCY, FRACTURES): VITD: 17.77 ng/mL — ABNORMAL LOW (ref 30.00–100.00)

## 2021-03-20 LAB — HEMOGLOBIN A1C: Hgb A1c MFr Bld: 5.8 % (ref 4.6–6.5)

## 2021-03-20 NOTE — Assessment & Plan Note (Signed)
Chronic Taking vitamin D daily Check vitamin D level  

## 2021-03-20 NOTE — Assessment & Plan Note (Addendum)
Chronic BP borderline controlled Will hold off on changes today Continue losartan 100 mg daily, diltiazem 240 mg daily, hctz 25 mg daily cmp

## 2021-03-20 NOTE — Assessment & Plan Note (Signed)
Chronic Mild, intermittent symbicort bid prn Albuterol as needed Continue above

## 2021-03-20 NOTE — Assessment & Plan Note (Signed)
Chronic Saw rheumatology - Dr Dimple Casey 10/2020  Elevated ck, aldolase likely baselin No change with steroids, so no further medication needed - just treat calcinosis

## 2021-03-20 NOTE — Addendum Note (Signed)
Addended by: Madelon Lips on: 03/20/2021 08:17 AM   Modules accepted: Orders

## 2021-03-20 NOTE — Assessment & Plan Note (Signed)
Chronic Check a1c Low sugar / carb diet Stressed regular exercise  

## 2021-03-24 DIAGNOSIS — L218 Other seborrheic dermatitis: Secondary | ICD-10-CM | POA: Diagnosis not present

## 2021-03-24 DIAGNOSIS — L720 Epidermal cyst: Secondary | ICD-10-CM | POA: Diagnosis not present

## 2021-03-24 LAB — HEPATITIS C ANTIBODY
Hepatitis C Ab: NONREACTIVE
SIGNAL TO CUT-OFF: 0 (ref <1.00)

## 2021-04-12 NOTE — Progress Notes (Signed)
Office Visit Note  Patient: Evan Burns             Date of Birth: 1981-09-21           MRN: [id number]             PCP: Binnie Rail, MD Referring: Binnie Rail, MD Visit Date: 04/13/2021   Subjective:  Other (Left foot pain and right hip pain )   History of Present Illness: [name] is a 40 y.o. male here for follow up for dermatomyositis with numerous cutaneous calcifications also with joint pain of the right hip and left foot.  Since our last visit he feels his symptoms are bothering him somewhat more particularly in the foot.  His skin is itching some of the time has not noticed any specific changes.  He denies any focal weakness just some difficulties due to the joint pain and decreased range of movement  Previous HPI 10/02/2020 History of Present Illness: [name] is a 40 y.o. male with a history of dermatomyositis here for evaluation of myalgias and skin problems. He has a long history of DM diagnosed around 20 years ago originally with muscle and skin disease activity. He was treated with steroids for this with apparent resolution of his muscle inflammation. Since that time he has had continued development of calcifications that sometimes become swollen or infected but no known recurrence of muscular involvement. He takes treatments for asthma but no known ILD. Today he complains of joint pain in multiple sites the right groin, left ankle, and right shoulder. He has had previous imaging of the shoulder and hip showing right shoulder bursitis and moderately advanced OA of the right hip. He has not had imaging for the left ankle pain. He does see swelling around the left ankle sometimes and worse after working all day. He has not noticed any focal weakness except around his right hip and thigh. He denies cough, dyspnea, fevers, weight loss, or lymphadenopathy.   Labs reviewed ANA 1:80 nuclear, speckled CK 435 WBC 3.9 ESR 9   Review of Systems   Constitutional:  Positive for fatigue.  HENT:  Negative for mouth sores, mouth dryness and nose dryness.   Eyes:  Negative for pain, itching and dryness.  Respiratory:  Negative for shortness of breath and difficulty breathing.   Cardiovascular:  Negative for chest pain and palpitations.  Gastrointestinal:  Negative for blood in stool, constipation and diarrhea.  Endocrine: Negative for increased urination.  Genitourinary:  Negative for difficulty urinating.  Musculoskeletal:  Positive for joint pain, joint pain, joint swelling and morning stiffness. Negative for myalgias, muscle tenderness and myalgias.  Skin:  Negative for color change, rash and redness.  Allergic/Immunologic: Negative for susceptible to infections.  Neurological:  Negative for dizziness, numbness, headaches, memory loss and weakness.  Hematological:  Negative for bruising/bleeding tendency.  Psychiatric/Behavioral:  Negative for confusion.    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/02/2020   Arthritis of right hip 09/16/2020   Tinea cruris 07/28/2020   Leg pain, bilateral 07/28/2020   Prediabetes 03/19/2020   Subcutaneous mass of finger of left hand 07/27/2019   Nodule of skin of finger of left hand 07/12/2019   Chest mass 02/14/2019   Other chest pain 02/14/2019   Superficial  phlebitis, chest wall 02/14/2019   Fatigue 11/22/2018   Essential hypertension 10/12/2010   Dermatomyositis ([place]) 06/17/2008   Asthma 02/22/2007    Past Medical History:  Diagnosis Date   Arthritis of right hip 09/16/2020   X-ray October 2021   Asthma    Dermatomyositis ([place])    Hypertension     Family History  Problem Relation Age of Onset   Hypertension Mother    Diabetes Mother    Kidney disease Mother    Hyperlipidemia Mother    Arthritis Mother     Hypertension Father    Hyperlipidemia Father    Past Surgical History:  Procedure Laterality Date   HAND SURGERY     INCISION AND DRAINAGE OF WOUND     MASS EXCISION Left 07/27/2019   Procedure: LEFT LONG FINGER NODULE EXCISION;  Surgeon: Leandrew Koyanagi, MD;  Location: [facility] [facility];  Service: Orthopedics;  Laterality: Left;   TYMPANOSTOMY TUBE PLACEMENT     Social History   Social History Narrative   Pt lives at home w/ parents       Very active at work - works two jobs   Immunization History  Administered Date(s) Administered   Influenza Split 08/25/2011, 07/25/2012   Influenza Whole 08/08/2007, 08/14/2008, 07/23/2009, 07/21/2010   Influenza,inj,Quad PF,6+ Mos 07/24/2013, 07/23/2014, 07/01/2015, 07/05/2016, 08/06/2017, 08/04/2018, 09/07/2019, 07/28/2020   PFIZER(Purple Top)SARS-COV-2 Vaccination 01/07/2020, 01/28/2020, 09/22/2020   Pneumococcal Polysaccharide-23 10/01/2014   Tdap 09/13/2013, 04/18/2019     Objective: Vital Signs: BP (!) 139/93 (BP Location: Left Arm, Patient Position: Sitting, Cuff Size: Normal)   Pulse 80   Resp 15   Ht '5\' 7"'  (1.702 m)   Wt 250 lb 12.8 oz (113.8 kg)   BMI 39.28 kg/m    Physical Exam Eyes:     Conjunctiva/sclera: Conjunctivae normal.  Skin:    General: Skin is warm and dry.     Comments: Diffuse irregular hyperpigmentation of the face and extremities, numerous large calcified nodules present on bilateral upper extremities over extensor surfaces of elbows forearms wrists and hands  Neurological:     Mental Status: He is alert.     Musculoskeletal Exam:  Shoulders full ROM no tenderness or swelling Elbows full ROM no tenderness or swelling Wrists full ROM no tenderness or swelling Fingers full ROM no tenderness or swelling Hip right decreased internal range of motion left side normal Knees full ROM no tenderness or swelling Ankles full ROM no tenderness or swelling   Investigation: No additional  findings.  Imaging: No results found.  Recent Labs: Lab Results  Component Value Date   WBC 4.5 03/20/2021   HGB 13.6 03/20/2021   PLT 240.0 03/20/2021   NA 140 03/20/2021   K 4.1 03/20/2021   CL 108 03/20/2021   CO2 26 03/20/2021   GLUCOSE 90 03/20/2021   BUN 15 03/20/2021   CREATININE 0.98 03/20/2021   BILITOT 0.3 03/20/2021   ALKPHOS 44 03/20/2021   AST 18 03/20/2021   ALT 25 03/20/2021   PROT 6.4 03/20/2021   ALBUMIN 4.2 03/20/2021   CALCIUM 9.1 03/20/2021   GFRAA  02/01/2010    >60        The eGFR has been calculated using the MDRD equation. This calculation has not been validated in all clinical situations. eGFR's persistently <60 mL/min signify possible Chronic Kidney Disease.    Speciality Comments: No specialty comments available.  Procedures:  No procedures performed Allergies: Sulfonamide derivatives, Cephalexin, and Lisinopril   Assessment /  Plan:     Visit Diagnoses: Dermatomyositis ([place]) - Plan: methotrexate (RHEUMATREX) 2.5 MG tablet, folic acid (FOLVITE) 1 MG tablet  The joint complaints today do seem to be most consistent with his fairly advanced osteoarthritis rather than inflammation.  However his previous varying but consistent mild elevated CK levels and extensive skin changes may suggest ongoing low amount of dermatomyositis activity.  We discussed treatment option with highly conservative option of calcium channel blocker or bisphosphonate trial versus a conventional DMARD.  We will recommend starting methotrexate 15 mg p.o. weekly and folic acid 1 mg daily.  He has recent normal baseline CBC and CMP has had negative hepatitis serology review of CT chest from 01/2019 no airspace or interstitial disease changes.  Vitamin D deficiency  Recent labs showing low vitamin D 17.77 we will need to check for adequacy after replacement for several months.  Considering his advanced osteoarthritis and systemic autoimmune disease will benefit from  repletion.  Orders: No orders of the defined types were placed in this encounter.  Meds ordered this encounter  Medications   methotrexate (RHEUMATREX) 2.5 MG tablet    Sig: Take 6 tablets (15 mg total) by mouth once a week. Caution:Chemotherapy. Protect from light.    Dispense:  30 tablet    Refill:  0   folic acid (FOLVITE) 1 MG tablet    Sig: Take 1 tablet (1 mg total) by mouth daily.    Dispense:  90 tablet    Refill:  0      Follow-Up Instructions: Return in about 4 weeks (around 05/11/2021) for New MTX start f/u 4wks.   Collier Salina, MD  Note - This record has been created using [employer].  Chart creation errors have been sought, but may not always  have been located. Such creation errors do not reflect on  the standard of medical care.

## 2021-04-13 ENCOUNTER — Ambulatory Visit (INDEPENDENT_AMBULATORY_CARE_PROVIDER_SITE_OTHER): Payer: Federal, State, Local not specified - PPO | Admitting: Internal Medicine

## 2021-04-13 ENCOUNTER — Encounter: Payer: Self-pay | Admitting: Internal Medicine

## 2021-04-13 ENCOUNTER — Other Ambulatory Visit: Payer: Self-pay

## 2021-04-13 VITALS — BP 139/93 | HR 80 | Resp 15 | Ht 67.0 in | Wt 250.8 lb

## 2021-04-13 DIAGNOSIS — L942 Calcinosis cutis: Secondary | ICD-10-CM | POA: Diagnosis not present

## 2021-04-13 DIAGNOSIS — M3390 Dermatopolymyositis, unspecified, organ involvement unspecified: Secondary | ICD-10-CM | POA: Diagnosis not present

## 2021-04-13 DIAGNOSIS — E559 Vitamin D deficiency, unspecified: Secondary | ICD-10-CM

## 2021-04-13 MED ORDER — METHOTREXATE 2.5 MG PO TABS: 15.0000 mg | ORAL_TABLET | ORAL | 0 refills | Status: AC

## 2021-04-13 MED ORDER — FOLIC ACID 1 MG PO TABS: 1.0000 mg | ORAL_TABLET | Freq: Every day | ORAL | 0 refills | Status: DC

## 2021-04-13 NOTE — Patient Instructions (Signed)
Methotrexate Tablets What is this medication? METHOTREXATE (METH oh TREX ate) treats inflammatory conditions such as arthritis and psoriasis. It works by decreasing inflammation, which can reduce pain and prevent long-term injury to the joints and skin. It may also be used to treat some types of cancer. It works by slowing down the growth of cancercells. This medicine may be used for other purposes; ask your health care provider orpharmacist if you have questions. COMMON BRAND NAME(S): Rheumatrex, Trexall What should I tell my care team before I take this medication? They need to know if you have any of these conditions: Fluid in the stomach area or lungs If you often drink alcohol Infection or immune system problems Kidney disease or on hemodialysis Liver disease Low blood counts, like low white cell, platelet, or red cell counts Lung disease Radiation therapy Stomach ulcers Ulcerative colitis An unusual or allergic reaction to methotrexate, other medications, foods, dyes, or preservatives Pregnant or trying to get pregnant Breast-feeding How should I use this medication? Take this medication by mouth with a glass of water. Follow the directions on the prescription label. Take your medication at regular intervals. Do not take it more often than directed. Do not stop taking except on your care team'sadvice. Make sure you know why you are taking this medication and how often you should take it. If this medication is used for a condition that is not cancer, like arthritis or psoriasis, it should be taken weekly, NOT daily. Taking thismedication more often than directed can cause serious side effects, even death. Talk to your care team about safe handling and disposal of this medication. Youmay need to take special precautions. Talk to your care team about the use of this medication in children. While thismedication may be prescribed for selected conditions, precautions do apply. Overdosage:  If you think you have taken too much of this medicine contact apoison control center or emergency room at once. NOTE: This medicine is only for you. Do not share this medicine with others. What if I miss a dose? If you miss a dose, talk with your care team. Do not take double or extra doses. What may interact with this medication? Do not take this medication with any of the following: Acitretin This medication may also interact with the following: Aspirin and aspirin-like medications including salicylates Azathioprine Certain antibiotics like penicillins, tetracycline, and chloramphenicol Certain medications that treat or prevent blood clots like warfarin, apixaban, dabigatran, and rivaroxaban Certain medications for stomach problems like esomeprazole, omeprazole, pantoprazole Cyclosporine Dapsone Diuretics Gold Hydroxychloroquine Live virus vaccines Medications for infection like acyclovir, adefovir, amphotericin B, bacitracin, cidofovir, foscarnet, ganciclovir, gentamicin, pentamidine, vancomycin Mercaptopurine NSAIDs, medications for pain and inflammation, like ibuprofen or naproxen Other cytotoxic agents Pamidronate Pemetrexed Penicillamine Phenylbutazone Phenytoin Probenecid Pyrimethamine Retinoids such as isotretinoin and tretinoin Steroid medications like prednisone or cortisone Sulfonamides like sulfasalazine and trimethoprim/sulfamethoxazole Theophylline Zoledronic acid This list may not describe all possible interactions. Give your health care provider a list of all the medicines, herbs, non-prescription drugs, or dietary supplements you use. Also tell them if you smoke, drink alcohol, or use illegaldrugs. Some items may interact with your medicine. What should I watch for while using this medication? Avoid alcoholic drinks. This medication can make you more sensitive to the sun. Keep out of the sun. If you cannot avoid being in the sun, wear protective clothing and  use sunscreen.Do not use sun lamps or tanning beds/booths. You may need blood work done while you are taking this medication.   Call your care team for advice if you get a fever, chills or sore throat, or other symptoms of a cold or flu. Do not treat yourself. This medication decreases your body's ability to fight infections. Try to avoid being aroundpeople who are sick. This medication may increase your risk to bruise or bleed. Call your care teamif you notice any unusual bleeding. Be careful brushing or flossing your teeth or using a toothpick because you may get an infection or bleed more easily. If you have any dental work done, tellyour dentist you are receiving this medication. Check with your care team if you get an attack of severe diarrhea, nausea and vomiting, or if you sweat a lot. The loss of too much body fluid can make itdangerous for you to take this medication. Talk to your care team about your risk of cancer. You may be more at risk forcertain types of cancers if you take this medication. Do not become pregnant while taking this medication or for 6 months after stopping it. Women should inform their care team if they wish to become pregnant or think they might be pregnant. Men should not father a child while taking this medication and for 3 months after stopping it. There is potential for serious harm to an unborn child. Talk to your care team for more information. Do not breast-feed an infant while taking this medication or for 1week after stopping it. This medication may make it more difficult to get pregnant or father a child.Talk to your care team if you are concerned about your fertility. What side effects may I notice from receiving this medication? Side effects that you should report to your care team as soon as possible: Allergic reactions-skin rash, itching, hives, swelling of the face, lips, tongue, or throat Blood clot-pain, swelling, or warmth in the leg, shortness of breath,  chest pain Dry cough, shortness of breath or trouble breathing Infection-fever, chills, cough, sore throat, wounds that don't heal, pain or trouble when passing urine, general feeling of discomfort or being unwell Kidney injury-decrease in the amount of urine, swelling of the ankles, hands, or feet Liver injury-right upper belly pain, loss of appetite, nausea, light-colored stool, dark yellow or brown urine, yellowing of the skin or eyes, unusual weakness or fatigue Low red blood cell count-unusual weakness or fatigue, dizziness, headache, trouble breathing Redness, blistering, peeling, or loosening of the skin, including inside the mouth Seizures Unusual bruising or bleeding Side effects that usually do not require medical attention (report to your careteam if they continue or are bothersome): Diarrhea Dizziness Hair loss Nausea Pain, redness, or swelling with sores inside the mouth or throat Vomiting This list may not describe all possible side effects. Call your doctor for medical advice about side effects. You may report side effects to FDA at1-800-FDA-1088. Where should I keep my medication? Keep out of the reach of children and pets. Store at room temperature between 20 and 25 degrees C (68 and 77 degrees F).Protect from light. Get rid of any unused medication after the expiration date. Talk to your care team about how to dispose of unused medication. Specialdirections may apply. NOTE: This sheet is a summary. It may not cover all possible information. If you have questions about this medicine, talk to your doctor, pharmacist, orhealth care provider.  2022 Elsevier/Gold Standard (2020-11-17 15:55:45)  

## 2021-05-11 ENCOUNTER — Ambulatory Visit: Payer: Federal, State, Local not specified - PPO | Admitting: Internal Medicine

## 2021-05-18 ENCOUNTER — Ambulatory Visit: Payer: Federal, State, Local not specified - PPO | Admitting: Internal Medicine

## 2021-05-31 NOTE — Progress Notes (Signed)
Office Visit Note  Patient: Evan Burns             Date of Birth: [birth year]           MRN: [id number]             PCP: Binnie Rail, MD Referring: Binnie Rail, MD Visit Date: 06/01/2021   Subjective:  Follow-up (Patient is due for third dose of MTX 15 mg weekly this week. Patient complains of continued right hip, right knee, and left foot pain. )   History of Present Illness: Evan Burns is a 40 y.o. male here for follow up for dermatomyositis after starting methotrexate 15 mg PO weekly for skin symptoms and mildly elevated CK levels. He has not noticed any problems starting the methotrexate weekly and has taken 2 doses so far about to take third. He continues to have right hip and knee pain and left foot pain similar as before.   Previous HPI 04/13/21 Evan Burns is a 40 y.o. male here for follow up for dermatomyositis with numerous cutaneous calcifications also with joint pain of the right hip and left foot.  Since our last visit he feels his symptoms are bothering him somewhat more particularly in the foot.  His skin is itching some of the time has not noticed any specific changes.  He denies any focal weakness just some difficulties due to the joint pain and decreased range of movement   10/02/2020 History of Present Illness: Evan Burns is a 40 y.o. male with a history of dermatomyositis here for evaluation of myalgias and skin problems. He has a long history of DM diagnosed around 20 years ago originally with muscle and skin disease activity. He was treated with steroids for this with apparent resolution of his muscle inflammation. Since that time he has had continued development of calcifications that sometimes become swollen or infected but no known recurrence of muscular involvement. He takes treatments for asthma but no known ILD. Today he complains of joint pain in multiple sites the right groin, left ankle, and right shoulder. He has had previous imaging of  the shoulder and hip showing right shoulder bursitis and moderately advanced OA of the right hip. He has not had imaging for the left ankle pain. He does see swelling around the left ankle sometimes and worse after working all day. He has not noticed any focal weakness except around his right hip and thigh. He denies cough, dyspnea, fevers, weight loss, or lymphadenopathy.   Labs reviewed ANA 1:80 nuclear, speckled CK 435 WBC 3.9 ESR 9    Review of Systems  Constitutional:  Negative for fatigue.  HENT:  Negative for mouth sores, mouth dryness and nose dryness.   Eyes:  Negative for pain, itching, visual disturbance and dryness.  Respiratory:  Negative for cough, hemoptysis, shortness of breath and difficulty breathing.   Cardiovascular:  Negative for chest pain, palpitations and swelling in legs/feet.  Gastrointestinal:  Negative for abdominal pain, blood in stool, constipation and diarrhea.  Endocrine: Negative for increased urination.  Genitourinary:  Negative for painful urination.  Musculoskeletal:  Positive for joint pain, joint pain, joint swelling, myalgias, muscle weakness, morning stiffness, muscle tenderness and myalgias.  Skin:  Negative for color change, rash and redness.  Allergic/Immunologic: Negative for susceptible to infections.  Neurological:  Positive for headaches. Negative for dizziness, numbness, memory loss and weakness.  Hematological:  Negative for swollen glands.  Psychiatric/Behavioral:  Negative for confusion and sleep  disturbance.    PMFS History:  Patient Active Problem List   Diagnosis Date Noted   High risk medication use 06/01/2021   Vitamin D deficiency 03/19/2021   Lower leg edema 01/09/2021   Pain in left ankle and joints of left foot 10/02/2020   Calcinosis cutis 10/02/2020   Arthritis of right hip 09/16/2020   Tinea cruris 07/28/2020   Leg pain, bilateral 07/28/2020   Prediabetes 03/19/2020   Chest mass 02/14/2019   Other chest pain  02/14/2019   Superficial phlebitis, chest wall 02/14/2019   Fatigue 11/22/2018   Essential hypertension 10/12/2010   Dermatomyositis ([place]) 06/17/2008   Asthma 02/22/2007    Past Medical History:  Diagnosis Date   Arthritis of right hip 09/16/2020   X-ray October 2021   Asthma    Dermatomyositis ([place])    Hypertension     Family History  Problem Relation Age of Onset   Hypertension Mother    Diabetes Mother    Kidney disease Mother    Hyperlipidemia Mother    Arthritis Mother    Hypertension Father    Hyperlipidemia Father    Past Surgical History:  Procedure Laterality Date   HAND SURGERY     INCISION AND DRAINAGE OF WOUND     MASS EXCISION Left 07/27/2019   Procedure: LEFT LONG FINGER NODULE EXCISION;  Surgeon: Leandrew Koyanagi, MD;  Location: [facility];  Service: Orthopedics;  Laterality: Left;   TYMPANOSTOMY TUBE PLACEMENT     Social History   Social History Narrative   Pt lives at home w/ parents       Very active at work - works two jobs   Immunization History  Administered Date(s) Administered   Influenza Split 08/25/2011, 07/25/2012   Influenza Whole 08/08/2007, 08/14/2008, 07/23/2009, 07/21/2010   Influenza,inj,Quad PF,6+ Mos 07/24/2013, 07/23/2014, 07/01/2015, 07/05/2016, 08/06/2017, 08/04/2018, 09/07/2019, 07/28/2020   PFIZER(Purple Top)SARS-COV-2 Vaccination 01/07/2020, 01/28/2020, 09/22/2020   Pneumococcal Polysaccharide-23 10/01/2014   Tdap 09/13/2013, 04/18/2019     Objective: Vital Signs: BP (!) 142/87 (BP Location: Left Arm, Patient Position: Sitting, Cuff Size: Normal)   Pulse 71   Ht 5' 7.5" (1.715 m)   Wt 250 lb 12.8 oz (113.8 kg)   BMI 38.70 kg/m    Physical Exam Skin:    General: Skin is warm and dry.     Comments: Diffuse skin hyperpigmentation especially face upper chest and arms, multiple large subcutaneous nodules without erythema or tenderness  Neurological:     Mental Status: He is alert.  Psychiatric:        Mood  and Affect: Mood normal.     Musculoskeletal Exam:  Wrists full ROM no tenderness or swelling Fingers full ROM no tenderness or swelling Decreased right hip internal rotation ROM Knees full ROM no tenderness or swelling Ankles full ROM no tenderness or swelling MTPs full ROM no tenderness or swelling   Investigation: No additional findings.  Imaging: No results found.  Recent Labs: Lab Results  Component Value Date   WBC 4.5 03/20/2021   HGB 13.6 03/20/2021   PLT 240.0 03/20/2021   NA 140 03/20/2021   K 4.1 03/20/2021   CL 108 03/20/2021   CO2 26 03/20/2021   GLUCOSE 90 03/20/2021   BUN 15 03/20/2021   CREATININE 0.98 03/20/2021   BILITOT 0.3 03/20/2021   ALKPHOS 44 03/20/2021   AST 18 03/20/2021   ALT 25 03/20/2021   PROT 6.4 03/20/2021   ALBUMIN 4.2 03/20/2021   CALCIUM 9.1 03/20/2021  GFRAA  02/01/2010    >60        The eGFR has been calculated using the MDRD equation. This calculation has not been validated in all clinical situations. eGFR's persistently <60 mL/min signify possible Chronic Kidney Disease.    Speciality Comments: No specialty comments available.  Procedures:  No procedures performed Allergies: Sulfonamide derivatives, Cephalexin, and Lisinopril   Assessment / Plan:     Visit Diagnoses: Dermatomyositis ([place])  Unclear disease activity level plan to continue methotrexate 15 mg PO weekly and folic acid 1 mg daily for now and see if CK level decrease to normal range. His baseline may be high although past decrease from steroid treatment suggests some ongoing activity. Plan to reassess in about 8-10 weeks with exam and labs.  Arthritis of right hip  Right hip OA remains about the same without interval change.  Calcinosis cutis  No significant skin changes over interval, on diltiazem 240 mg daily tolerating this well. We will see if there is any improvement to methotrexate treatment. Can consider trial of local or topical treatments  such as sodium thiosulfate if no improvement.  Lower leg edema  No significant pedal edema at this time and no stasis dermatitis changes.  High risk medication use - Plan: CBC with Differential/Platelet, COMPLETE METABOLIC PANEL WITH GFR  Monitoring for drug toxicity with recent methotrexate start checking CBC and CMP today.  Orders: Orders Placed This Encounter  Procedures   CBC with Differential/Platelet   COMPLETE METABOLIC PANEL WITH GFR    No orders of the defined types were placed in this encounter.    Follow-Up Instructions: Return in about 9 weeks (around 08/03/2021) for DM on MTX f/u 8-10wks.   Collier Salina, MD  Note - This record has been created using [employer].  Chart creation errors have been sought, but may not always  have been located. Such creation errors do not reflect on  the standard of medical care.

## 2021-06-01 ENCOUNTER — Other Ambulatory Visit: Payer: Self-pay

## 2021-06-01 ENCOUNTER — Encounter: Payer: Self-pay | Admitting: Internal Medicine

## 2021-06-01 ENCOUNTER — Ambulatory Visit: Payer: Federal, State, Local not specified - PPO | Admitting: Internal Medicine

## 2021-06-01 VITALS — BP 142/87 | HR 71 | Ht 67.5 in | Wt 250.8 lb

## 2021-06-01 DIAGNOSIS — M3390 Dermatopolymyositis, unspecified, organ involvement unspecified: Secondary | ICD-10-CM

## 2021-06-01 DIAGNOSIS — M1611 Unilateral primary osteoarthritis, right hip: Secondary | ICD-10-CM | POA: Diagnosis not present

## 2021-06-01 DIAGNOSIS — R600 Localized edema: Secondary | ICD-10-CM | POA: Diagnosis not present

## 2021-06-01 DIAGNOSIS — Z79899 Other long term (current) drug therapy: Secondary | ICD-10-CM | POA: Diagnosis not present

## 2021-06-01 DIAGNOSIS — L942 Calcinosis cutis: Secondary | ICD-10-CM

## 2021-06-01 LAB — CBC WITH DIFFERENTIAL/PLATELET
Absolute Monocytes: 353 cells/uL (ref 200–950)
Basophils Absolute: 82 cells/uL (ref 0–200)
Basophils Relative: 2 %
Eosinophils Absolute: 209 cells/uL (ref 15–500)
Eosinophils Relative: 5.1 %
HCT: 43 % (ref 38.5–50.0)
Hemoglobin: 13.8 g/dL (ref 13.2–17.1)
Lymphs Abs: 1476 cells/uL (ref 850–3900)
MCH: 27 pg (ref 27.0–33.0)
MCHC: 32.1 g/dL (ref 32.0–36.0)
MCV: 84 fL (ref 80.0–100.0)
MPV: 9.3 fL (ref 7.5–12.5)
Monocytes Relative: 8.6 %
Neutro Abs: 1980 cells/uL (ref 1500–7800)
Neutrophils Relative %: 48.3 %
Platelets: 230 10*3/uL (ref 140–400)
RBC: 5.12 10*6/uL (ref 4.20–5.80)
RDW: 12.6 % (ref 11.0–15.0)
Total Lymphocyte: 36 %
WBC: 4.1 10*3/uL (ref 3.8–10.8)

## 2021-06-01 LAB — COMPLETE METABOLIC PANEL WITH GFR
AG Ratio: 1.8 (calc) (ref 1.0–2.5)
ALT: 29 U/L (ref 9–46)
AST: 25 U/L (ref 10–40)
Albumin: 4.2 g/dL (ref 3.6–5.1)
Alkaline phosphatase (APISO): 44 U/L (ref 36–130)
BUN: 18 mg/dL (ref 7–25)
CO2: 26 mmol/L (ref 20–32)
Calcium: 9.5 mg/dL (ref 8.6–10.3)
Chloride: 105 mmol/L (ref 98–110)
Creat: 1.01 mg/dL (ref 0.60–1.26)
Globulin: 2.4 g/dL (calc) (ref 1.9–3.7)
Glucose, Bld: 81 mg/dL (ref 65–99)
Potassium: 4.6 mmol/L (ref 3.5–5.3)
Sodium: 139 mmol/L (ref 135–146)
Total Bilirubin: 0.5 mg/dL (ref 0.2–1.2)
Total Protein: 6.6 g/dL (ref 6.1–8.1)
eGFR: 97 mL/min/{1.73_m2} (ref >=60)

## 2021-06-02 NOTE — Progress Notes (Signed)
Results look fine to continue methotrexate for now, we can follow up as planned.

## 2021-06-11 ENCOUNTER — Encounter: Payer: Self-pay | Admitting: Internal Medicine

## 2021-06-11 NOTE — Telephone Encounter (Signed)
If he brings the form we can fill these out. I'll need to specify frequency and duration of leave or any work limitations. Is this just for the days missed for clinic or more if so can he specify so I get it right the first time.

## 2021-07-03 ENCOUNTER — Ambulatory Visit (INDEPENDENT_AMBULATORY_CARE_PROVIDER_SITE_OTHER): Payer: Federal, State, Local not specified - PPO

## 2021-07-03 DIAGNOSIS — Z23 Encounter for immunization: Secondary | ICD-10-CM

## 2021-07-09 ENCOUNTER — Encounter: Payer: Self-pay | Admitting: Internal Medicine

## 2021-07-21 DIAGNOSIS — L218 Other seborrheic dermatitis: Secondary | ICD-10-CM | POA: Diagnosis not present

## 2021-08-02 NOTE — Progress Notes (Deleted)
Office Visit Note  Patient: Evan Burns             Date of Birth: September 11, 1981           MRN: [id number]             PCP: Binnie Rail, MD Referring: Binnie Rail, MD Visit Date: 08/03/2021   Subjective:  No chief complaint on file.   History of Present Illness: Evan Burns is a 40 y.o. male here for follow up for dermatomyositis with numerous calcifications after starting methotrexate 15 mg PO weekly.***   Previous HPI 06/01/21 Evan Burns is a 40 y.o. male here for follow up for dermatomyositis after starting methotrexate 15 mg PO weekly for skin symptoms and mildly elevated CK levels. He has not noticed any problems starting the methotrexate weekly and has taken 2 doses so far about to take third. He continues to have right hip and knee pain and left foot pain similar as before.     10/02/2020 History of Present Illness: Evan Burns is a 40 y.o. male with a history of dermatomyositis here for evaluation of myalgias and skin problems. He has a long history of DM diagnosed around 20 years ago originally with muscle and skin disease activity. He was treated with steroids for this with apparent resolution of his muscle inflammation. Since that time he has had continued development of calcifications that sometimes become swollen or infected but no known recurrence of muscular involvement. He takes treatments for asthma but no known ILD. Today he complains of joint pain in multiple sites the right groin, left ankle, and right shoulder. He has had previous imaging of the shoulder and hip showing right shoulder bursitis and moderately advanced OA of the right hip. He has not had imaging for the left ankle pain. He does see swelling around the left ankle sometimes and worse after working all day. He has not noticed any focal weakness except around his right hip and thigh. He denies cough, dyspnea, fevers, weight loss, or lymphadenopathy.   Labs reviewed ANA 1:80 nuclear,  speckled CK 435 WBC 3.9 ESR 9   No Rheumatology ROS completed.   PMFS History:  Patient Active Problem List   Diagnosis Date Noted   High risk medication use 06/01/2021   Vitamin D deficiency 03/19/2021   Lower leg edema 01/09/2021   Pain in left ankle and joints of left foot 10/02/2020   Calcinosis cutis 10/02/2020   Arthritis of right hip 09/16/2020   Tinea cruris 07/28/2020   Leg pain, bilateral 07/28/2020   Prediabetes 03/19/2020   Chest mass 02/14/2019   Other chest pain 02/14/2019   Superficial phlebitis, chest wall 02/14/2019   Fatigue 11/22/2018   Essential hypertension 10/12/2010   Dermatomyositis ([place]) 06/17/2008   Asthma 02/22/2007    Past Medical History:  Diagnosis Date   Arthritis of right hip 09/16/2020   X-ray October 2021   Asthma    Dermatomyositis ([place])    Hypertension     Family History  Problem Relation Age of Onset   Hypertension Mother    Diabetes Mother    Kidney disease Mother    Hyperlipidemia Mother    Arthritis Mother    Hypertension Father    Hyperlipidemia Father    Past Surgical History:  Procedure Laterality Date   HAND SURGERY     INCISION AND DRAINAGE OF WOUND     MASS EXCISION Left 07/27/2019   Procedure: LEFT LONG FINGER  NODULE EXCISION;  Surgeon: Leandrew Koyanagi, MD;  Location: [facility];  Service: Orthopedics;  Laterality: Left;   TYMPANOSTOMY TUBE PLACEMENT     Social History   Social History Narrative   Pt lives at home w/ parents       Very active at work - works two jobs   Immunization History  Administered Date(s) Administered   Influenza Split 08/25/2011, 07/25/2012   Influenza Whole 08/08/2007, 08/14/2008, 07/23/2009, 07/21/2010   Influenza,inj,Quad PF,6+ Mos 07/24/2013, 07/23/2014, 07/01/2015, 07/05/2016, 08/06/2017, 08/04/2018, 09/07/2019, 07/28/2020, 07/03/2021   PFIZER(Purple Top)SARS-COV-2 Vaccination 01/07/2020, 01/28/2020, 09/22/2020   Pneumococcal Polysaccharide-23 10/01/2014   Tdap  09/13/2013, 04/18/2019     Objective: Vital Signs: There were no vitals taken for this visit.   Physical Exam   Musculoskeletal Exam: ***  CDAI Exam: CDAI Score: -- Patient Global: --; Provider Global: -- Swollen: --; Tender: -- Joint Exam 08/03/2021   No joint exam has been documented for this visit   There is currently no information documented on the homunculus. Go to the Rheumatology activity and complete the homunculus joint exam.  Investigation: No additional findings.  Imaging: No results found.  Recent Labs: Lab Results  Component Value Date   WBC 4.1 06/01/2021   HGB 13.8 06/01/2021   PLT 230 06/01/2021   NA 139 06/01/2021   K 4.6 06/01/2021   CL 105 06/01/2021   CO2 26 06/01/2021   GLUCOSE 81 06/01/2021   BUN 18 06/01/2021   CREATININE 1.01 06/01/2021   BILITOT 0.5 06/01/2021   ALKPHOS 44 03/20/2021   AST 25 06/01/2021   ALT 29 06/01/2021   PROT 6.6 06/01/2021   ALBUMIN 4.2 03/20/2021   CALCIUM 9.5 06/01/2021   GFRAA  02/01/2010    >60        The eGFR has been calculated using the MDRD equation. This calculation has not been validated in all clinical situations. eGFR's persistently <60 mL/min signify possible Chronic Kidney Disease.    Speciality Comments: No specialty comments available.  Procedures:  No procedures performed Allergies: Sulfonamide derivatives, Cephalexin, and Lisinopril   Assessment / Plan:     Visit Diagnoses: No diagnosis found.  ***  Orders: No orders of the defined types were placed in this encounter.  No orders of the defined types were placed in this encounter.    Follow-Up Instructions: No follow-ups on file.   Collier Salina, MD  Note - This record has been created using [employer].  Chart creation errors have been sought, but may not always  have been located. Such creation errors do not reflect on  the standard of medical care.

## 2021-08-03 ENCOUNTER — Ambulatory Visit: Payer: Federal, State, Local not specified - PPO | Admitting: Internal Medicine

## 2021-08-23 NOTE — Progress Notes (Signed)
Subjective:    Patient ID: Evan Burns, male    DOB: September 19, 1981, 40 y.o.   MRN: [id number]  This visit occurred during the SARS-CoV-2 public health emergency.  Safety protocols were in place, including screening questions prior to the visit, additional usage of staff PPE, and extensive cleaning of exam room while observing appropriate contact time as indicated for disinfecting solutions.    HPI He is here for an acute visit for cold symptoms.  His symptoms started 3 week ago.    He is experiencing dry cough that feels deep at times.  A couple of days ago he did have some wheezing and shortness of breath, but not since then or before then.  He states some slight fatigue, sore throat mostly from coughingAnd headaches from coughing.  He denies other symptoms.  He has tried taking cough syrup.  He has been using his Symbicort daily, but was not able to find his albuterol inhaler.   Overall he thinks his symptoms are better, but still there.  He is on methotrexate and relatively new to methotrexate-he did not hold this when he got sick.  He denies any reflux, but did start taking Nexium recently because the cough would not go away and he was not sure why.  Medications and allergies reviewed with patient and updated if appropriate.  Patient Active Problem List   Diagnosis Date Noted   High risk medication use 06/01/2021   Vitamin D deficiency 03/19/2021   Lower leg edema 01/09/2021   Pain in left ankle and joints of left foot 10/02/2020   Calcinosis cutis 10/02/2020   Arthritis of right hip 09/16/2020   Tinea cruris 07/28/2020   Leg pain, bilateral 07/28/2020   Prediabetes 03/19/2020   Chest mass 02/14/2019   Other chest pain 02/14/2019   Superficial phlebitis, chest wall 02/14/2019   Fatigue 11/22/2018   Essential hypertension 10/12/2010   Dermatomyositis (HCC) 06/17/2008   Asthma 02/22/2007    Current Outpatient Medications on File Prior to Visit  Medication Sig  Dispense Refill   budesonide-formoterol (SYMBICORT) 160-4.5 MCG/ACT inhaler Inhale 2 puffs into the lungs 2 (two) times daily. 1 each 8   Butenafine HCl 1 % cream Apply twice daily to affected area 30 g 0   COD LIVER OIL PO Take 1 capsule by mouth daily.     Creatine POWD Take 1 Scoop by mouth.     diltiazem (DILACOR XR) 240 MG 24 hr capsule Take 1 capsule (240 mg total) by mouth daily. 30 capsule 5   folic acid (FOLVITE) 1 MG tablet Take 1 tablet (1 mg total) by mouth daily. 90 tablet 0   hydrochlorothiazide (HYDRODIURIL) 25 MG tablet Take 1 tablet (25 mg total) by mouth daily. 90 tablet 3   hydrocortisone 2.5 % cream Apply topically.     losartan (COZAAR) 100 MG tablet take 1 tablet by mouth once daily. 90 tablet 1   methotrexate (RHEUMATREX) 2.5 MG tablet Take 6 tablets (15 mg total) by mouth once a week. 30 tablet 1   mometasone (ELOCON) 0.1 % cream Apply topically.     naproxen (NAPROSYN) 500 MG tablet Take 1 tablet (500 mg total) by mouth 2 (two) times daily. 30 tablet 0   Omega-3 Fatty Acids (FISH OIL) 1000 MG CAPS Take 1,000 mg by mouth daily.     tacrolimus (PROTOPIC) 0.03 % ointment Apply 1 application topically 2 (two) times daily.     VITAMIN D PO Take by mouth daily.  No current facility-administered medications on file prior to visit.    Past Medical History:  Diagnosis Date   Arthritis of right hip 09/16/2020   X-ray October 2021   Asthma    Dermatomyositis (HCC)    Hypertension     Past Surgical History:  Procedure Laterality Date   HAND SURGERY     INCISION AND DRAINAGE OF WOUND     MASS EXCISION Left 07/27/2019   Procedure: LEFT LONG FINGER NODULE EXCISION;  Surgeon: Tarry Kos, MD;  Location: [facility];  Service: Orthopedics;  Laterality: Left;   TYMPANOSTOMY TUBE PLACEMENT      Social History   Socioeconomic History   Marital status: Married    Spouse name: Not on file   Number of children: Not on file   Years of education: Not on  file   Highest education level: Not on file  Occupational History   Not on file  Tobacco Use   Smoking status: Never   Smokeless tobacco: Never  Vaping Use   Vaping Use: Never used  Substance and Sexual Activity   Alcohol use: No   Drug use: No   Sexual activity: Not on file  Other Topics Concern   Not on file  Social History Narrative   Pt lives at home w/ parents       Very active at work - works two jobs   Social Determinants of Corporate investment banker Strain: Not on Ship broker Insecurity: Not on file  Transportation Needs: Not on file  Physical Activity: Not on file  Stress: Not on file  Social Connections: Not on file    Family History  Problem Relation Age of Onset   Hypertension Mother    Diabetes Mother    Kidney disease Mother    Hyperlipidemia Mother    Arthritis Mother    Hypertension Father    Hyperlipidemia Father     Review of Systems  Constitutional:  Positive for fatigue (mild). Negative for appetite change, chills and fever.  HENT:  Positive for sore throat (maybe from coughing). Negative for congestion, ear pain, sinus pressure and sinus pain.   Respiratory:  Positive for cough (dry) and wheezing. Negative for shortness of breath.   Gastrointestinal:  Negative for abdominal pain, diarrhea and nausea.  Neurological:  Positive for headaches (from cough). Negative for dizziness and light-headedness.      Objective:   Vitals:   08/25/21 1054  BP: 130/80  Pulse: 84  Temp: 98.7 F (37.1 C)  SpO2: 99%   BP Readings from Last 3 Encounters:  08/25/21 130/80  08/24/21 134/74  06/01/21 (!) 142/87   Wt Readings from Last 3 Encounters:  08/25/21 254 lb (115.2 kg)  08/24/21 256 lb (116.1 kg)  06/01/21 250 lb 12.8 oz (113.8 kg)   Body mass index is 39.78 kg/m.   Physical Exam    GENERAL APPEARANCE: Appears stated age, well appearing, NAD EYES: conjunctiva clear, no icterus HENT: bilateral tympanic membranes and ear canals normal,  oropharynx with no erythema or exudates, trachea midline, no cervical or supraclavicular lymphadenopathy LUNGS: Unlabored breathing, good air entry bilaterally, clear to auscultation without wheeze or crackles CARDIOVASCULAR: Normal S1,S2 , no edema SKIN: Warm, dry      Assessment & Plan:    See Problem List for Assessment and Plan of chronic medical problems.

## 2021-08-23 NOTE — Progress Notes (Signed)
Office Visit Note  Patient: Evan Burns             Date of Birth: [birth year]           MRN: [id number]             PCP: Binnie Rail, MD Referring: Binnie Rail, MD Visit Date: 08/24/2021   Subjective:  Arthritis (Right hip pain)   History of Present Illness: Evan Burns is a 40 y.o. male here for follow up for dermatomyositis with chronic calcinosis cutis on methotrexate 15 mg PO weekly since about 3 months ago. He has not noticed any difference since our last visit. On further discission he was apparently taking only 1 tablet weekly of the methotrexate so far below recommended dose. He had some new skin peeling over his face increased since this summer and saw dermatology for this multiple times. They tried a few topical medication without response but now using tacrolimus with improvement.  Previous HPI 06/01/21 Evan Burns is a 40 y.o. male here for follow up for dermatomyositis after starting methotrexate 15 mg PO weekly for skin symptoms and mildly elevated CK levels. He has not noticed any problems starting the methotrexate weekly and has taken 2 doses so far about to take third. He continues to have right hip and knee pain and left foot pain similar as before.     10/02/2020 History of Present Illness: Evan Burns is a 40 y.o. male with a history of dermatomyositis here for evaluation of myalgias and skin problems. He has a long history of DM diagnosed around 20 years ago originally with muscle and skin disease activity. He was treated with steroids for this with apparent resolution of his muscle inflammation. Since that time he has had continued development of calcifications that sometimes become swollen or infected but no known recurrence of muscular involvement. He takes treatments for asthma but no known ILD. Today he complains of joint pain in multiple sites the right groin, left ankle, and right shoulder. He has had previous imaging of the shoulder and hip  showing right shoulder bursitis and moderately advanced OA of the right hip. He has not had imaging for the left ankle pain. He does see swelling around the left ankle sometimes and worse after working all day. He has not noticed any focal weakness except around his right hip and thigh. He denies cough, dyspnea, fevers, weight loss, or lymphadenopathy.   Labs reviewed ANA 1:80 nuclear, speckled CK 435    Review of Systems  Constitutional:  Positive for fatigue.  HENT:  Negative for mouth dryness.   Eyes:  Negative for dryness.  Respiratory:  Negative for shortness of breath.   Cardiovascular:  Positive for swelling in legs/feet.  Gastrointestinal:  Negative for constipation.  Endocrine: Negative for excessive thirst.  Genitourinary:  Negative for difficulty urinating.  Musculoskeletal:  Positive for joint pain, gait problem, joint pain, joint swelling, muscle weakness, morning stiffness and muscle tenderness.  Skin:  Positive for rash.  Allergic/Immunologic: Negative for susceptible to infections.  Neurological:  Positive for weakness.  Hematological:  Negative for bruising/bleeding tendency.  Psychiatric/Behavioral:  Negative for sleep disturbance.    PMFS History:  Patient Active Problem List   Diagnosis Date Noted   High risk medication use 06/01/2021   Vitamin D deficiency 03/19/2021   Lower leg edema 01/09/2021   Pain in left ankle and joints of left foot 10/02/2020   Calcinosis cutis 10/02/2020   Arthritis  of right hip 09/16/2020   Tinea cruris 07/28/2020   Leg pain, bilateral 07/28/2020   Prediabetes 03/19/2020   Chest mass 02/14/2019   Other chest pain 02/14/2019   Superficial phlebitis, chest wall 02/14/2019   Fatigue 11/22/2018   Cough 11/08/2015   Essential hypertension 10/12/2010   Dermatomyositis ([place]) 06/17/2008   Asthma 02/22/2007    Past Medical History:  Diagnosis Date   Arthritis of right hip 09/16/2020   X-ray October 2021   Asthma     Dermatomyositis ([place])    Hypertension     Family History  Problem Relation Age of Onset   Hypertension Mother    Diabetes Mother    Kidney disease Mother    Hyperlipidemia Mother    Arthritis Mother    Hypertension Father    Hyperlipidemia Father    Past Surgical History:  Procedure Laterality Date   HAND SURGERY     INCISION AND DRAINAGE OF WOUND     MASS EXCISION Left 07/27/2019   Procedure: LEFT LONG FINGER NODULE EXCISION;  Surgeon: Leandrew Koyanagi, MD;  Location: [place];  Service: Orthopedics;  Laterality: Left;   TYMPANOSTOMY TUBE PLACEMENT     Social History   Social History Narrative   Pt lives at home w/ parents       Very active at work - works two jobs   Immunization History  Administered Date(s) Administered   Influenza Split 08/25/2011, 07/25/2012   Influenza Whole 08/08/2007, 08/14/2008, 07/23/2009, 07/21/2010   Influenza,inj,Quad PF,6+ Mos 07/24/2013, 07/23/2014, 07/01/2015, 07/05/2016, 08/06/2017, 08/04/2018, 09/07/2019, 07/28/2020, 07/03/2021   PFIZER(Purple Top)SARS-COV-2 Vaccination 01/07/2020, 01/28/2020, 09/22/2020   Pfizer Covid-19 Vaccine Bivalent Booster 85yr & up 08/04/2021   Pneumococcal Polysaccharide-23 10/01/2014   Tdap 09/13/2013, 04/18/2019     Objective: Vital Signs: BP 134/74 (BP Location: Left Arm, Patient Position: Sitting, Cuff Size: Normal)   Pulse 81   Resp 15   Ht '5\' 7"'  (1.702 m)   Wt 256 lb (116.1 kg)   BMI 40.10 kg/m    Physical Exam Cardiovascular:     Rate and Rhythm: Normal rate and regular rhythm.  Pulmonary:     Effort: Pulmonary effort is normal.     Breath sounds: Normal breath sounds.  Skin:    General: Skin is warm and dry.     Comments: Diffuse, patchy skin hyperpigmentation especially on the face upper chest and arms, multiple large subcutaneous nodules without erythema or tenderness   Neurological:     Mental Status: He is alert.     Musculoskeletal Exam:  Right hip pain with decreased  ROM internal rotation, no focal joint tenderness  Investigation: No additional findings.  Imaging: DG Chest 2 View  Result Date: 08/25/2021 CLINICAL DATA:  Wheezing and cough for 3 weeks EXAM: CHEST - 2 VIEW COMPARISON:  07/05/2016 chest radiograph. FINDINGS: Stable cardiomediastinal silhouette with normal heart size. No pneumothorax. No pleural effusion. Lungs appear clear, with no acute consolidative airspace disease and no pulmonary edema. IMPRESSION: No active cardiopulmonary disease. Electronically Signed   By: JIlona SorrelM.D.   On: 08/25/2021 11:49    Recent Labs: Lab Results  Component Value Date   WBC 4.1 06/01/2021   HGB 13.8 06/01/2021   PLT 230 06/01/2021   NA 139 06/01/2021   K 4.6 06/01/2021   CL 105 06/01/2021   CO2 26 06/01/2021   GLUCOSE 81 06/01/2021   BUN 18 06/01/2021   CREATININE 1.01 06/01/2021   BILITOT 0.5 06/01/2021   ALKPHOS  44 03/20/2021   AST 25 06/01/2021   ALT 29 06/01/2021   PROT 6.6 06/01/2021   ALBUMIN 4.2 03/20/2021   CALCIUM 9.5 06/01/2021   GFRAA  02/01/2010    >60        The eGFR has been calculated using the MDRD equation. This calculation has not been validated in all clinical situations. eGFR's persistently <60 mL/min signify possible Chronic Kidney Disease.    Speciality Comments: No specialty comments available.  Procedures:  No procedures performed Allergies: Sulfonamide derivatives, Cephalexin, and Lisinopril   Assessment / Plan:     Visit Diagnoses: Dermatomyositis ([place]) - Plan: methotrexate (RHEUMATREX) 2.5 MG tablet, folic acid (FOLVITE) 1 MG tablet  No weakness or muscle pain symptoms, skin rash on face has improved with local treatments through dermatology office. We will increase methotrexate to 15 mg PO weekly dose which was previously planned but not communicated well and folic acid 1 mg PO daily.  Calcinosis cutis  This is unchanged since last visit.  High risk medication use  Not repeating CBC and CMP  today he was not on therapeutic dose of methotrexate will check again after dose increased.  Orders: No orders of the defined types were placed in this encounter.  Meds ordered this encounter  Medications   methotrexate (RHEUMATREX) 2.5 MG tablet    Sig: Take 6 tablets (15 mg total) by mouth once a week.    Dispense:  30 tablet    Refill:  1   folic acid (FOLVITE) 1 MG tablet    Sig: Take 1 tablet (1 mg total) by mouth daily.    Dispense:  90 tablet    Refill:  0      Follow-Up Instructions: Return in about 2 months (around 10/24/2021) for DM on MTX increased f/u 8wks.   Collier Salina, MD  Note - This record has been created using [employer].  Chart creation errors have been sought, but may not always  have been located. Such creation errors do not reflect on  the standard of medical care.

## 2021-08-24 ENCOUNTER — Other Ambulatory Visit: Payer: Self-pay

## 2021-08-24 ENCOUNTER — Ambulatory Visit: Payer: Federal, State, Local not specified - PPO | Admitting: Internal Medicine

## 2021-08-24 ENCOUNTER — Encounter: Payer: Self-pay | Admitting: Internal Medicine

## 2021-08-24 VITALS — BP 134/74 | HR 81 | Resp 15 | Ht 67.0 in | Wt 256.0 lb

## 2021-08-24 DIAGNOSIS — L942 Calcinosis cutis: Secondary | ICD-10-CM | POA: Diagnosis not present

## 2021-08-24 DIAGNOSIS — M3313 Other dermatomyositis without myopathy: Secondary | ICD-10-CM

## 2021-08-24 DIAGNOSIS — M3390 Dermatopolymyositis, unspecified, organ involvement unspecified: Secondary | ICD-10-CM

## 2021-08-24 DIAGNOSIS — Z79899 Other long term (current) drug therapy: Secondary | ICD-10-CM | POA: Diagnosis not present

## 2021-08-24 MED ORDER — FOLIC ACID 1 MG PO TABS: 1.0000 mg | ORAL_TABLET | Freq: Every day | ORAL | 0 refills | Status: DC

## 2021-08-24 MED ORDER — METHOTREXATE 2.5 MG PO TABS: 15.0000 mg | ORAL_TABLET | ORAL | 1 refills | Status: AC

## 2021-08-25 ENCOUNTER — Ambulatory Visit: Payer: Federal, State, Local not specified - PPO | Admitting: Internal Medicine

## 2021-08-25 ENCOUNTER — Encounter: Payer: Self-pay | Admitting: Internal Medicine

## 2021-08-25 ENCOUNTER — Ambulatory Visit (INDEPENDENT_AMBULATORY_CARE_PROVIDER_SITE_OTHER): Payer: Federal, State, Local not specified - PPO

## 2021-08-25 VITALS — BP 130/80 | HR 84 | Temp 98.7°F | Ht 67.0 in | Wt 254.0 lb

## 2021-08-25 DIAGNOSIS — R062 Wheezing: Secondary | ICD-10-CM | POA: Diagnosis not present

## 2021-08-25 DIAGNOSIS — J4520 Mild intermittent asthma, uncomplicated: Secondary | ICD-10-CM

## 2021-08-25 DIAGNOSIS — R051 Acute cough: Secondary | ICD-10-CM

## 2021-08-25 DIAGNOSIS — R059 Cough, unspecified: Secondary | ICD-10-CM | POA: Diagnosis not present

## 2021-08-25 MED ORDER — ALBUTEROL SULFATE HFA 108 (90 BASE) MCG/ACT IN AERS: 2.0000 | INHALATION_SPRAY | Freq: Four times a day (QID) | RESPIRATORY_TRACT | 11 refills | Status: DC | PRN

## 2021-08-25 MED ORDER — AZITHROMYCIN 250 MG PO TABS: ORAL_TABLET | ORAL | 0 refills | Status: DC

## 2021-08-25 NOTE — Assessment & Plan Note (Signed)
Acute Has had a cough for 3 weeks It is slightly better, but still persistent and feels deep.  No other significant symptoms at this time-May have resolved ?  Lingering cough from URI, asthma exacerbation or pneumonia Seems less likely GERD or other cause, but okay to continue to take PPI for now Chest x-ray today Renew albuterol inhaler and use as needed.  Continue Symbicort twice daily.  Lungs sound clear so no need for steroids Given that he is on methotrexate and this has been a persistent cough he is at risk for atypical infections and pneumonia Will start Z-Pak Continue cough syrup as needed Call if no improvement-May need short course of steroids, but ideally would like to avoid

## 2021-08-25 NOTE — Patient Instructions (Addendum)
    Chest xray was ordered.   Have this downstairs.    Medications changes include :   zpak ( antibiotic).   Your prescription(s) have been submitted to your pharmacy. Please take as directed and contact our office if you believe you are having problem(s) with the medication(s).    Please call if there is no improvement in your symptoms.

## 2021-09-01 ENCOUNTER — Encounter: Payer: Self-pay | Admitting: Internal Medicine

## 2021-09-03 MED ORDER — METHYLPREDNISOLONE 4 MG PO TBPK: ORAL_TABLET | ORAL | 0 refills | Status: DC

## 2021-09-20 ENCOUNTER — Encounter: Payer: Self-pay | Admitting: Internal Medicine

## 2021-09-20 NOTE — Progress Notes (Signed)
Subjective:    Patient ID: Evan Burns, male    DOB: April 19, 1981, 40 y.o.   MRN: [id number]  This visit occurred during the SARS-CoV-2 public health emergency.  Safety protocols were in place, including screening questions prior to the visit, additional usage of staff PPE, and extensive cleaning of exam room while observing appropriate contact time as indicated for disinfecting solutions.     HPI The patient is here for follow up of their chronic medical problems, including htn, prediabetes, dermatomyositis, asthma   He is exercising regularly.   He struggles with eating too much bread.   Medications and allergies reviewed with patient and updated if appropriate.  Patient Active Problem List   Diagnosis Date Noted   High risk medication use 06/01/2021   Vitamin D deficiency 03/19/2021   Lower leg edema 01/09/2021   Pain in left ankle and joints of left foot 10/02/2020   Calcinosis cutis 10/02/2020   Arthritis of right hip 09/16/2020   Tinea cruris 07/28/2020   Leg pain, bilateral 07/28/2020   Prediabetes 03/19/2020   Chest mass 02/14/2019   Other chest pain 02/14/2019   Superficial phlebitis, chest wall 02/14/2019   Fatigue 11/22/2018   Cough 11/08/2015   Essential hypertension 10/12/2010   Dermatomyositis (HCC) 06/17/2008   Asthma 02/22/2007    Current Outpatient Medications on File Prior to Visit  Medication Sig Dispense Refill   albuterol (VENTOLIN HFA) 108 (90 Base) MCG/ACT inhaler Inhale 2 puffs into the lungs every 6 (six) hours as needed for wheezing or shortness of breath. 18 g 11   budesonide-formoterol (SYMBICORT) 160-4.5 MCG/ACT inhaler Inhale 2 puffs into the lungs 2 (two) times daily. 1 each 8   Butenafine HCl 1 % cream Apply twice daily to affected area 30 g 0   COD LIVER OIL PO Take 1 capsule by mouth daily.     Creatine POWD Take 1 Scoop by mouth.     diltiazem (DILACOR XR) 240 MG 24 hr capsule Take 1 capsule (240 mg total) by mouth daily. 30  capsule 5   folic acid (FOLVITE) 1 MG tablet Take 1 tablet (1 mg total) by mouth daily. 90 tablet 0   hydrochlorothiazide (HYDRODIURIL) 25 MG tablet Take 1 tablet (25 mg total) by mouth daily. 90 tablet 3   hydrocortisone 2.5 % cream Apply topically.     losartan (COZAAR) 100 MG tablet take 1 tablet by mouth once daily. 90 tablet 1   methotrexate (RHEUMATREX) 2.5 MG tablet Take 6 tablets (15 mg total) by mouth once a week. 30 tablet 1   mometasone (ELOCON) 0.1 % cream Apply topically.     naproxen (NAPROSYN) 500 MG tablet Take 1 tablet (500 mg total) by mouth 2 (two) times daily. 30 tablet 0   Omega-3 Fatty Acids (FISH OIL) 1000 MG CAPS Take 1,000 mg by mouth daily.     tacrolimus (PROTOPIC) 0.03 % ointment Apply 1 application topically 2 (two) times daily.     VITAMIN D PO Take by mouth daily.     No current facility-administered medications on file prior to visit.    Past Medical History:  Diagnosis Date   Arthritis of right hip 09/16/2020   X-ray October 2021   Asthma    Dermatomyositis [facility])    Hypertension     Past Surgical History:  Procedure Laterality Date   HAND SURGERY     INCISION AND DRAINAGE OF WOUND     MASS EXCISION Left 07/27/2019  Procedure: LEFT LONG FINGER NODULE EXCISION;  Surgeon: Tarry Kos, MD;  Location: [facility];  Service: Orthopedics;  Laterality: Left;   TYMPANOSTOMY TUBE PLACEMENT      Social History   Socioeconomic History   Marital status: Married    Spouse name: Not on file   Number of children: Not on file   Years of education: Not on file   Highest education level: Not on file  Occupational History   Not on file  Tobacco Use   Smoking status: Never   Smokeless tobacco: Never  Vaping Use   Vaping Use: Never used  Substance and Sexual Activity   Alcohol use: No   Drug use: No   Sexual activity: Not on file  Other Topics Concern   Not on file  Social History Narrative   Pt lives at home w/ parents       Very  active at work - works two jobs   Social Determinants of Corporate investment banker Strain: Not on Ship broker Insecurity: Not on file  Transportation Needs: Not on file  Physical Activity: Not on file  Stress: Not on file  Social Connections: Not on file    Family History  Problem Relation Age of Onset   Hypertension Mother    Diabetes Mother    Kidney disease Mother    Hyperlipidemia Mother    Arthritis Mother    Hypertension Father    Hyperlipidemia Father     Review of Systems  Constitutional:  Negative for fever.  Respiratory:  Positive for cough (minimal). Negative for chest tightness, shortness of breath and wheezing.   Cardiovascular:  Negative for chest pain, palpitations and leg swelling.  Neurological:  Positive for headaches (occ). Negative for dizziness and light-headedness.      Objective:   Vitals:   09/21/21 0751  BP: 130/82  Pulse: 80  Temp: 98.1 F (36.7 C)  SpO2: 97%   BP Readings from Last 3 Encounters:  09/21/21 130/82  08/25/21 130/80  08/24/21 134/74   Wt Readings from Last 3 Encounters:  09/21/21 250 lb 12.8 oz (113.8 kg)  08/25/21 254 lb (115.2 kg)  08/24/21 256 lb (116.1 kg)   Body mass index is 39.28 kg/m.   Physical Exam    Constitutional: Appears well-developed and well-nourished. No distress.  HENT:  Head: Normocephalic and atraumatic.  Neck: Neck supple. No tracheal deviation present. No thyromegaly present.  No cervical lymphadenopathy Cardiovascular: Normal rate, regular rhythm and normal heart sounds.   No murmur heard. No carotid bruit .  No edema Pulmonary/Chest: Effort normal and breath sounds normal. No respiratory distress. No has no wheezes. No rales.  Skin: Skin is warm and dry. Not diaphoretic.  Psychiatric: Normal mood and affect. Behavior is normal.      Assessment & Plan:    See Problem List for Assessment and Plan of chronic medical problems.

## 2021-09-20 NOTE — Patient Instructions (Addendum)
   Prevnar pneumonia vaccine given.   Blood work was ordered.     Medications changes include :   none  Your prescription(s) have been submitted to your pharmacy. Please take as directed and contact our office if you believe you are having problem(s) with the medication(s).   Please followup in 6 months

## 2021-09-21 ENCOUNTER — Other Ambulatory Visit: Payer: Self-pay

## 2021-09-21 ENCOUNTER — Ambulatory Visit: Payer: Federal, State, Local not specified - PPO | Admitting: Internal Medicine

## 2021-09-21 VITALS — BP 130/82 | HR 80 | Temp 98.1°F | Ht 67.0 in | Wt 250.8 lb

## 2021-09-21 DIAGNOSIS — R7303 Prediabetes: Secondary | ICD-10-CM | POA: Diagnosis not present

## 2021-09-21 DIAGNOSIS — J4520 Mild intermittent asthma, uncomplicated: Secondary | ICD-10-CM | POA: Diagnosis not present

## 2021-09-21 DIAGNOSIS — M79604 Pain in right leg: Secondary | ICD-10-CM

## 2021-09-21 DIAGNOSIS — Z23 Encounter for immunization: Secondary | ICD-10-CM | POA: Diagnosis not present

## 2021-09-21 DIAGNOSIS — L942 Calcinosis cutis: Secondary | ICD-10-CM

## 2021-09-21 DIAGNOSIS — M79605 Pain in left leg: Secondary | ICD-10-CM

## 2021-09-21 DIAGNOSIS — I10 Essential (primary) hypertension: Secondary | ICD-10-CM

## 2021-09-21 LAB — COMPREHENSIVE METABOLIC PANEL
ALT: 36 U/L (ref 0–53)
AST: 31 U/L (ref 0–37)
Albumin: 4.4 g/dL (ref 3.5–5.2)
Alkaline Phosphatase: 45 U/L (ref 39–117)
BUN: 17 mg/dL (ref 6–23)
CO2: 26 mEq/L (ref 19–32)
Calcium: 8.9 mg/dL (ref 8.4–10.5)
Chloride: 104 mEq/L (ref 96–112)
Creatinine, Ser: 1.13 mg/dL (ref 0.40–1.50)
GFR: 81.8 mL/min (ref >60.00)
Glucose, Bld: 90 mg/dL (ref 70–99)
Potassium: 3.8 mEq/L (ref 3.5–5.1)
Sodium: 138 mEq/L (ref 135–145)
Total Bilirubin: 0.3 mg/dL (ref 0.2–1.2)
Total Protein: 6.7 g/dL (ref 6.0–8.3)

## 2021-09-21 LAB — HEMOGLOBIN A1C: Hgb A1c MFr Bld: 5.9 % (ref 4.6–6.5)

## 2021-09-21 MED ORDER — BUDESONIDE-FORMOTEROL FUMARATE 160-4.5 MCG/ACT IN AERO
2.0000 | INHALATION_SPRAY | Freq: Two times a day (BID) | RESPIRATORY_TRACT | 8 refills | Status: DC
Start: 2021-09-21 — End: 2022-09-01

## 2021-09-21 NOTE — Assessment & Plan Note (Signed)
Chronic BP well controlled Continue diltiazem 240 mg daily, losartan 100 mg daily, hctz 25 mg daily cmp

## 2021-09-21 NOTE — Assessment & Plan Note (Signed)
Chronic Mild,persistent Currently controlled Minimal cough - improved Continue symbicort 160-4.5 mcg/act twice daily and albuterol prn

## 2021-09-21 NOTE — Addendum Note (Signed)
Addended by: Karma Ganja on: 09/21/2021 08:30 AM   Modules accepted: Orders

## 2021-09-21 NOTE — Assessment & Plan Note (Signed)
Chronic Controlled, stable Continue hctz 25 mg daily   

## 2021-09-21 NOTE — Assessment & Plan Note (Signed)
Chronic Check a1c Low sugar / carb diet Stressed regular exercise  

## 2021-09-21 NOTE — Assessment & Plan Note (Signed)
Chronic New calcium deposit right index mcp joint - will f/u with rheum

## 2021-10-01 ENCOUNTER — Ambulatory Visit: Payer: Federal, State, Local not specified - PPO | Admitting: Orthopedic Surgery

## 2021-10-01 ENCOUNTER — Ambulatory Visit: Payer: Self-pay

## 2021-10-01 ENCOUNTER — Encounter: Payer: Self-pay | Admitting: Orthopedic Surgery

## 2021-10-01 ENCOUNTER — Other Ambulatory Visit: Payer: Self-pay

## 2021-10-01 VITALS — BP 136/87 | HR 98 | Ht 67.0 in | Wt 249.8 lb

## 2021-10-01 DIAGNOSIS — L02414 Cutaneous abscess of left upper limb: Secondary | ICD-10-CM | POA: Diagnosis not present

## 2021-10-01 DIAGNOSIS — M25522 Pain in left elbow: Secondary | ICD-10-CM | POA: Diagnosis not present

## 2021-10-01 DIAGNOSIS — M79644 Pain in right finger(s): Secondary | ICD-10-CM | POA: Diagnosis not present

## 2021-10-01 MED ORDER — OXYCODONE HCL 5 MG PO TABS: 5.0000 mg | ORAL_TABLET | Freq: Four times a day (QID) | ORAL | 0 refills | Status: AC | PRN

## 2021-10-01 MED ORDER — DOXYCYCLINE HYCLATE 50 MG PO CAPS: 50.0000 mg | ORAL_CAPSULE | Freq: Two times a day (BID) | ORAL | 0 refills | Status: AC

## 2021-10-01 NOTE — Progress Notes (Signed)
Office Visit Note   Patient: Evan Burns           Date of Birth: 1981/01/19           MRN: [id number] Visit Date: 10/01/2021              Requested by: [name], MD [address] [place],  [place] [address] PCP: [name], MD   Assessment & Plan: Visit Diagnoses:  1. Pain in left elbow   2. Pain in right finger(s)   3. Abscess of left elbow     Plan: Patient has a superficial abscess at the medial aspect of the elbow over one of his multiple areas of calcific deposits.  He has been followed by other providers for calcinosis cutis.  He's has other areas drain in the past.  Will plan on an I&D of this superficial abscess in the office today and start him on PO abx.  I'd like to see him back early next week to re-evaluate.  Discussed that this may need formal I&D in the operating room if his infection worsens.   Follow-Up Instructions: No follow-ups on file.   Orders:  Orders Placed This Encounter  Procedures   XR Finger Index Right   XR Elbow Complete Left (3+View)   Meds ordered this encounter  Medications   doxycycline (VIBRAMYCIN) 50 MG capsule    Sig: Take 1 capsule (50 mg total) by mouth 2 (two) times daily for 7 days.    Dispense:  14 capsule    Refill:  0   oxyCODONE (ROXICODONE) 5 MG immediate release tablet    Sig: Take 1 tablet (5 mg total) by mouth every 6 (six) hours as needed for up to 3 days for severe pain.    Dispense:  12 tablet    Refill:  0      Procedures: Incision & Drainage  Date/Time: 10/01/2021 1:12 PM Performed by: Marlyne Beards, MD Authorized by: Marlyne Beards, MD   Consent:    Consent obtained:  Verbal   Consent given by:  Patient   Risks, benefits, and alternatives were discussed: yes     Risks discussed:  Bleeding, incomplete drainage and infection   Alternatives discussed:  No treatment and alternative treatment Location:    Type:  Abscess   Location:  Upper extremity   Upper extremity location:  Elbow    Elbow location:  L elbow Pre-procedure details:    Skin preparation:  Chlorhexidine Sedation:    Sedation type:  None Anesthesia:    Anesthesia method:  Local infiltration   Local anesthetic:  Lidocaine 1% w/o epi Procedure type:    Complexity:  Simple Procedure details:    Needle aspiration: no     Incision types:  Single straight   Incision depth:  Subcutaneous   Scalpel blade:  15   Wound management:  Probed and deloculated   Drainage:  Bloody and purulent   Drainage amount:  Moderate   Wound treatment:  Wound left open   Packing materials:  None Post-procedure details:    Procedure completion:  Tolerated well, no immediate complications   Clinical Data: No additional findings.   Subjective: Chief Complaint  Patient presents with   Right Hand - Follow-up   Left Elbow - Follow-up    This is a 40 yo RHD M who works at the [employer] who presents with superficial medial left elbow pain and swelling.  This started on Monday.  He denies  any falls or injuries.  Some milky white drainage from this area.  He has a history of calcinosis cutis multiple superficial calcium deposits in bilateral upper extremities including his hands and elbows.  Some days have drained previously but resolved on their own.  He has pain at the medial aspect of the elbow surrounding one of his calcium deposits.  There is minimal surrounding erythema but there is some slight induration around the area.  There is some scant milky white drainage from the area.  He denies any pain in his forearm or hand.  Denies any systemic symptoms.   Review of Systems   Objective: Vital Signs: BP 136/87 (BP Location: Right Arm, Patient Position: Sitting, Cuff Size: Large)   Pulse 98   Ht 5\' 7"  (1.702 m)   Wt 249 lb 12.8 oz (113.3 kg)   SpO2 96%   BMI 39.12 kg/m   Physical Exam Constitutional:      Appearance: Normal appearance.  Cardiovascular:     Rate and Rhythm: Normal rate.     Pulses: Normal pulses.   Pulmonary:     Effort: Pulmonary effort is normal.  Skin:    General: Skin is warm and dry.     Capillary Refill: Capillary refill takes less than 2 seconds.  Neurological:     Mental Status: He is alert.    Left Elbow Exam   Tenderness  Left elbow tenderness location: TTP at medial elbow in area of significant calcium deposition.  Scant milky white drainage.  Has multiple large calcium filled lesions.   Range of Motion  The patient has normal left elbow ROM.  Muscle Strength  The patient has normal left elbow strength.  Other  Erythema: absent Sensation: normal Pulse: present     Specialty Comments:  No specialty comments available.  Imaging: Multiple views of the left elbow taken today are reviewed and interpreted by me.  They demonstrate numerous calcified lesions.  The lesion at the medial elbow appears most superficial and is in the area of his recent pain and drainage.    PMFS History: Patient Active Problem List   Diagnosis Date Noted   Abscess of left elbow 10/01/2021   High risk medication use 06/01/2021   Vitamin D deficiency 03/19/2021   Lower leg edema 01/09/2021   Pain in left ankle and joints of left foot 10/02/2020   Calcinosis cutis 10/02/2020   Arthritis of right hip 09/16/2020   Tinea cruris 07/28/2020   Leg pain, bilateral 07/28/2020   Prediabetes 03/19/2020   Chest mass 02/14/2019   Other chest pain 02/14/2019   Superficial phlebitis, chest wall 02/14/2019   Fatigue 11/22/2018   Cough 11/08/2015   Essential hypertension 10/12/2010   Dermatomyositis (HCC) 06/17/2008   Asthma 02/22/2007   Past Medical History:  Diagnosis Date   Arthritis of right hip 09/16/2020   X-ray October 2021   Asthma    Dermatomyositis (HCC)    Hypertension     Family History  Problem Relation Age of Onset   Hypertension Mother    Diabetes Mother    Kidney disease Mother    Hyperlipidemia Mother    Arthritis Mother    Hypertension Father     Hyperlipidemia Father     Past Surgical History:  Procedure Laterality Date   HAND SURGERY     INCISION AND DRAINAGE OF WOUND     MASS EXCISION Left 07/27/2019   Procedure: LEFT LONG FINGER NODULE EXCISION;  Surgeon: 09/26/2019, MD;  Location: [facility];  Service: Orthopedics;  Laterality: Left;   TYMPANOSTOMY TUBE PLACEMENT     Social History   Occupational History   Not on file  Tobacco Use   Smoking status: Never   Smokeless tobacco: Never  Vaping Use   Vaping Use: Never used  Substance and Sexual Activity   Alcohol use: No   Drug use: No   Sexual activity: Not on file

## 2021-10-06 ENCOUNTER — Other Ambulatory Visit: Payer: Self-pay

## 2021-10-06 ENCOUNTER — Encounter: Payer: Self-pay | Admitting: Orthopedic Surgery

## 2021-10-06 ENCOUNTER — Ambulatory Visit (INDEPENDENT_AMBULATORY_CARE_PROVIDER_SITE_OTHER): Payer: Federal, State, Local not specified - PPO | Admitting: Orthopedic Surgery

## 2021-10-06 VITALS — BP 134/82 | HR 67

## 2021-10-06 DIAGNOSIS — L02414 Cutaneous abscess of left upper limb: Secondary | ICD-10-CM

## 2021-10-06 DIAGNOSIS — L942 Calcinosis cutis: Secondary | ICD-10-CM

## 2021-10-06 NOTE — Progress Notes (Signed)
Office Visit Note   Patient: Evan Burns           Date of Birth: February 08, 1981           MRN: [id number] Visit Date: 10/06/2021              Requested by: [name], MD [address] [place],  [place] [address] PCP: [name], MD   Assessment & Plan: Visit Diagnoses:  1. Calcinosis cutis   2. Abscess of left elbow     Plan: Patient doing much better today.  Pain has resolved.  The wound at the medial aspect of his elbow is not healed yet but is not draining like it was before.  He has been doing daily wound care.  Discussed continued daily wound care until the area is healed.  He can follow up with me in a week or two if he's still having trouble but otherwise can see me as needed.   Follow-Up Instructions: No follow-ups on file.   Orders:  No orders of the defined types were placed in this encounter.  No orders of the defined types were placed in this encounter.     Procedures: No procedures performed   Clinical Data: No additional findings.   Subjective: Chief Complaint  Patient presents with   Left Elbow - Follow-up    This is a 40 year old right-hand-dominant male who presents for follow-up of a left elbow abscess that was drained in the office.  He has a few more days of antibiotics.  He he overall feels that he is doing much better.  The pain has resolved.  He has full range of motion the elbow without difficulty.  The drainage has subsided although this wound has not yet healed.  He is been doing daily wound care but has not gotten the area wet yet.  He denies any numbness or paresthesias in the hand.  He denies any systemic symptoms.   Review of Systems   Objective: Vital Signs: BP 134/82 (BP Location: Right Arm, Patient Position: Sitting, Cuff Size: Normal)    Pulse 67    SpO2 98%   Physical Exam Constitutional:      Appearance: Normal appearance.  Cardiovascular:     Rate and Rhythm: Normal rate.     Pulses: Normal pulses.  Pulmonary:      Effort: Pulmonary effort is normal.  Skin:    General: Skin is warm and dry.     Capillary Refill: Capillary refill takes less than 2 seconds.  Neurological:     Mental Status: He is alert.    Right Elbow Exam   Tenderness  The patient is experiencing no tenderness.   Range of Motion  The patient has normal right elbow ROM.  Muscle Strength  The patient has normal right elbow strength.  Other  Erythema: absent Sensation: normal Pulse: present  Comments:  Wound at medial aspect of elbow with mild bleeding but no purulent drainage.      Specialty Comments:  No specialty comments available.  Imaging: No results found.   PMFS History: Patient Active Problem List   Diagnosis Date Noted   Abscess of left elbow 10/01/2021   High risk medication use 06/01/2021   Vitamin D deficiency 03/19/2021   Lower leg edema 01/09/2021   Pain in left ankle and joints of left foot 10/02/2020   Calcinosis cutis 10/02/2020   Arthritis of right hip 09/16/2020   Tinea cruris 07/28/2020   Leg  pain, bilateral 07/28/2020   Prediabetes 03/19/2020   Chest mass 02/14/2019   Other chest pain 02/14/2019   Superficial phlebitis, chest wall 02/14/2019   Fatigue 11/22/2018   Cough 11/08/2015   Essential hypertension 10/12/2010   Dermatomyositis (HCC) 06/17/2008   Asthma 02/22/2007   Past Medical History:  Diagnosis Date   Arthritis of right hip 09/16/2020   X-ray October 2021   Asthma    Dermatomyositis (HCC)    Hypertension     Family History  Problem Relation Age of Onset   Hypertension Mother    Diabetes Mother    Kidney disease Mother    Hyperlipidemia Mother    Arthritis Mother    Hypertension Father    Hyperlipidemia Father     Past Surgical History:  Procedure Laterality Date   HAND SURGERY     INCISION AND DRAINAGE OF WOUND     MASS EXCISION Left 07/27/2019   Procedure: LEFT LONG FINGER NODULE EXCISION;  Surgeon: Tarry Kos, MD;  Location: [facility];  Service: Orthopedics;  Laterality: Left;   TYMPANOSTOMY TUBE PLACEMENT     Social History   Occupational History   Not on file  Tobacco Use   Smoking status: Never   Smokeless tobacco: Never  Vaping Use   Vaping Use: Never used  Substance and Sexual Activity   Alcohol use: No   Drug use: No   Sexual activity: Not on file

## 2021-10-26 NOTE — Progress Notes (Deleted)
Office Visit Note  Patient: Evan Burns             Date of Birth: 1980-12-09           MRN: [id number]             PCP: Binnie Rail, MD Referring: Binnie Rail, MD Visit Date: 10/27/2021   Subjective:  No chief complaint on file.   History of Present Illness: Evan Burns is a 41 y.o. male here for follow up for dermatomyositis with chronic calcinosis cutis after starting methotrexate 15 mg PO weekly. At last visit he was not yet on treatment dose. Since our last visit he had drainage of abscess of the elbow.***   Previous HPI 08/24/21 Evan Burns is a 41 y.o. male here for follow up for dermatomyositis with chronic calcinosis cutis on methotrexate 15 mg PO weekly since about 3 months ago. He has not noticed any difference since our last visit. On further discission he was apparently taking only 1 tablet weekly of the methotrexate so far below recommended dose. He had some new skin peeling over his face increased since this summer and saw dermatology for this multiple times. They tried a few topical medication without response but now using tacrolimus with improvement.   Previous HPI   10/02/2020 History of Present Illness: Evan Burns is a 41 y.o. male with a history of dermatomyositis here for evaluation of myalgias and skin problems. He has a long history of DM diagnosed around 20 years ago originally with muscle and skin disease activity. He was treated with steroids for this with apparent resolution of his muscle inflammation. Since that time he has had continued development of calcifications that sometimes become swollen or infected but no known recurrence of muscular involvement. He takes treatments for asthma but no known ILD. Today he complains of joint pain in multiple sites the right groin, left ankle, and right shoulder. He has had previous imaging of the shoulder and hip showing right shoulder bursitis and moderately advanced OA of the right hip. He has not  had imaging for the left ankle pain. He does see swelling around the left ankle sometimes and worse after working all day. He has not noticed any focal weakness except around his right hip and thigh. He denies cough, dyspnea, fevers, weight loss, or lymphadenopathy.    No Rheumatology ROS completed.   PMFS History:  Patient Active Problem List   Diagnosis Date Noted   Abscess of left elbow 10/01/2021   High risk medication use 06/01/2021   Vitamin D deficiency 03/19/2021   Lower leg edema 01/09/2021   Pain in left ankle and joints of left foot 10/02/2020   Calcinosis cutis 10/02/2020   Arthritis of right hip 09/16/2020   Tinea cruris 07/28/2020   Leg pain, bilateral 07/28/2020   Prediabetes 03/19/2020   Chest mass 02/14/2019   Other chest pain 02/14/2019   Superficial phlebitis, chest wall 02/14/2019   Fatigue 11/22/2018   Cough 11/08/2015   Essential hypertension 10/12/2010   Dermatomyositis ([place]) 06/17/2008   Asthma 02/22/2007    Past Medical History:  Diagnosis Date   Arthritis of right hip 09/16/2020   X-ray October 2021   Asthma    Dermatomyositis ([place])    Hypertension     Family History  Problem Relation Age of Onset   Hypertension Mother    Diabetes Mother    Kidney disease Mother    Hyperlipidemia Mother  Arthritis Mother    Hypertension Father    Hyperlipidemia Father    Past Surgical History:  Procedure Laterality Date   HAND SURGERY     INCISION AND DRAINAGE OF WOUND     MASS EXCISION Left 07/27/2019   Procedure: LEFT LONG FINGER NODULE EXCISION;  Surgeon: Leandrew Koyanagi, MD;  Location: [facility];  Service: Orthopedics;  Laterality: Left;   TYMPANOSTOMY TUBE PLACEMENT     Social History   Social History Narrative   Pt lives at home w/ parents       Very active at work - works two jobs   Immunization History  Administered Date(s) Administered   Influenza Split 08/25/2011, 07/25/2012   Influenza Whole 08/08/2007, 08/14/2008,  07/23/2009, 07/21/2010   Influenza,inj,Quad PF,6+ Mos 07/24/2013, 07/23/2014, 07/01/2015, 07/05/2016, 08/06/2017, 08/04/2018, 09/07/2019, 07/28/2020, 07/03/2021   PFIZER(Purple Top)SARS-COV-2 Vaccination 01/07/2020, 01/28/2020, 09/22/2020   Pfizer Covid-19 Vaccine Bivalent Booster 46yr & up 08/04/2021   Pneumococcal Conjugate-13 09/21/2021   Pneumococcal Polysaccharide-23 10/01/2014   Tdap 09/13/2013, 04/18/2019     Objective: Vital Signs: There were no vitals taken for this visit.   Physical Exam   Musculoskeletal Exam: ***  CDAI Exam: CDAI Score: -- Patient Global: --; Provider Global: -- Swollen: --; Tender: -- Joint Exam 10/27/2021   No joint exam has been documented for this visit   There is currently no information documented on the homunculus. Go to the Rheumatology activity and complete the homunculus joint exam.  Investigation: No additional findings.  Imaging: No results found.  Recent Labs: Lab Results  Component Value Date   WBC 4.1 06/01/2021   HGB 13.8 06/01/2021   PLT 230 06/01/2021   NA 138 09/21/2021   K 3.8 09/21/2021   CL 104 09/21/2021   CO2 26 09/21/2021   GLUCOSE 90 09/21/2021   BUN 17 09/21/2021   CREATININE 1.13 09/21/2021   BILITOT 0.3 09/21/2021   ALKPHOS 45 09/21/2021   AST 31 09/21/2021   ALT 36 09/21/2021   PROT 6.7 09/21/2021   ALBUMIN 4.4 09/21/2021   CALCIUM 8.9 09/21/2021   GFRAA  02/01/2010    >60        The eGFR has been calculated using the MDRD equation. This calculation has not been validated in all clinical situations. eGFR's persistently <60 mL/min signify possible Chronic Kidney Disease.    Speciality Comments: No specialty comments available.  Procedures:  No procedures performed Allergies: Sulfonamide derivatives, Cephalexin, and Lisinopril   Assessment / Plan:     Visit Diagnoses: No diagnosis found.  ***  Orders: No orders of the defined types were placed in this encounter.  No orders of the  defined types were placed in this encounter.    Follow-Up Instructions: No follow-ups on file.   CCollier Salina MD  Note - This record has been created using [employer]  Chart creation errors have been sought, but may not always  have been located. Such creation errors do not reflect on  the standard of medical care.

## 2021-10-27 ENCOUNTER — Ambulatory Visit: Payer: Federal, State, Local not specified - PPO | Admitting: Internal Medicine

## 2021-11-10 ENCOUNTER — Ambulatory Visit: Payer: Federal, State, Local not specified - PPO | Admitting: Internal Medicine

## 2021-11-17 NOTE — Progress Notes (Signed)
Office Visit Note  Patient: Evan Burns             Date of Birth: [birth year]           MRN: [id number]             PCP: Binnie Rail, MD Referring: Binnie Rail, MD Visit Date: 11/18/2021   Subjective:  Pain of the Right Hip and Pain of the Right Knee   History of Present Illness: Evan Burns is a 41 y.o. male here for follow up for dermatomyositis with chronic calcinosis cutis on methotrexate 15 mg PO weekly and folic acid 1 mg daily. He continues to have right hip pain also having right knee pain. Notices some fatigue when taking the methotrexate, no other intolerance or side effect notices. He saw Dr. Tempie Donning for treatment of right elbow abscess that has healed well no ongoing pain or inflammation and off antibiotics. No other infections or major events.  Previous HPI 08/24/21 Evan Burns is a 41 y.o. male here for follow up for dermatomyositis with chronic calcinosis cutis on methotrexate 15 mg PO weekly since about 3 months ago. He has not noticed any difference since our last visit. On further discission he was apparently taking only 1 tablet weekly of the methotrexate so far below recommended dose. He had some new skin peeling over his face increased since this summer and saw dermatology for this multiple times. They tried a few topical medication without response but now using tacrolimus with improvement.   Previous HPI 10/02/2020 History of Present Illness: Evan Burns is a 41 y.o. male with a history of dermatomyositis here for evaluation of myalgias and skin problems. He has a long history of DM diagnosed around 20 years ago originally with muscle and skin disease activity. He was treated with steroids for this with apparent resolution of his muscle inflammation. Since that time he has had continued development of calcifications that sometimes become swollen or infected but no known recurrence of muscular involvement. He takes treatments for asthma but no  known ILD. Today he complains of joint pain in multiple sites the right groin, left ankle, and right shoulder. He has had previous imaging of the shoulder and hip showing right shoulder bursitis and moderately advanced OA of the right hip. He has not had imaging for the left ankle pain. He does see swelling around the left ankle sometimes and worse after working all day. He has not noticed any focal weakness except around his right hip and thigh. He denies cough, dyspnea, fevers, weight loss, or lymphadenopathy.   Review of Systems  Constitutional:  Positive for fatigue.  HENT:  Negative for mouth sores, mouth dryness and nose dryness.   Eyes:  Negative for pain, itching and dryness.  Respiratory:  Positive for shortness of breath. Negative for difficulty breathing.   Cardiovascular:  Negative for chest pain and palpitations.  Gastrointestinal:  Negative for blood in stool, constipation and diarrhea.  Endocrine: Negative for increased urination.  Genitourinary:  Negative for difficulty urinating.  Musculoskeletal:  Positive for joint pain, joint pain, joint swelling, myalgias, morning stiffness, muscle tenderness and myalgias.  Skin:  Positive for rash. Negative for color change.  Allergic/Immunologic: Negative for susceptible to infections.  Neurological:  Positive for headaches and weakness. Negative for dizziness, numbness and memory loss.  Hematological:  Negative for bruising/bleeding tendency.  Psychiatric/Behavioral:  Negative for confusion.     PMFS History:  Patient Active Problem List  Diagnosis Date Noted   Facial rash 03/09/2022   COVID-19 01/08/2022   Abscess of left elbow 10/01/2021   High risk medication use 06/01/2021   Vitamin D deficiency 03/19/2021   Lower leg edema 01/09/2021   Pain in left ankle and joints of left foot 10/02/2020   Calcinosis cutis 10/02/2020   Arthritis of right hip 09/16/2020   Tinea cruris 07/28/2020   Leg pain, bilateral 07/28/2020    Prediabetes 03/19/2020   Chest mass 02/14/2019   Other chest pain 02/14/2019   Superficial phlebitis, chest wall 02/14/2019   Fatigue 11/22/2018   Cough 11/08/2015   Essential hypertension 10/12/2010   Dermatomyositis ([place]) 06/17/2008   Asthma 02/22/2007    Past Medical History:  Diagnosis Date   Arthritis of right hip 09/16/2020   X-ray October 2021   Asthma    Dermatomyositis ([place])    Hypertension     Family History  Problem Relation Age of Onset   Hypertension Mother    Diabetes Mother    Kidney disease Mother    Hyperlipidemia Mother    Arthritis Mother    Hypertension Father    Hyperlipidemia Father    Past Surgical History:  Procedure Laterality Date   HAND SURGERY     INCISION AND DRAINAGE OF WOUND     MASS EXCISION Left 07/27/2019   Procedure: LEFT LONG FINGER NODULE EXCISION;  Surgeon: Leandrew Koyanagi, MD;  Location: [facility];  Service: Orthopedics;  Laterality: Left;   TYMPANOSTOMY TUBE PLACEMENT     Social History   Social History Narrative   Pt lives at home w/ parents       Very active at work - works two jobs   Immunization History  Administered Date(s) Administered   Influenza Split 08/25/2011, 07/25/2012   Influenza Whole 08/08/2007, 08/14/2008, 07/23/2009, 07/21/2010   Influenza,inj,Quad PF,6+ Mos 07/24/2013, 07/23/2014, 07/01/2015, 07/05/2016, 08/06/2017, 08/04/2018, 09/07/2019, 07/28/2020, 07/03/2021   PFIZER(Purple Top)SARS-COV-2 Vaccination 01/07/2020, 01/28/2020, 09/22/2020   Pfizer Covid-19 Vaccine Bivalent Booster 64yr & up 08/04/2021   Pneumococcal Conjugate-13 09/21/2021   Pneumococcal Polysaccharide-23 10/01/2014   Tdap 09/13/2013, 04/18/2019     Objective: Vital Signs: BP (!) 148/92 (BP Location: Left Arm, Patient Position: Sitting, Cuff Size: Large)   Pulse 77   Resp 12   Ht _0  (1.702 m)   Wt 258 lb 6.4 oz (117.2 kg)   BMI 40.47 kg/m    Physical Exam Constitutional:      Appearance: He is obese.   Cardiovascular:     Rate and Rhythm: Normal rate and regular rhythm.  Pulmonary:     Effort: Pulmonary effort is normal.     Breath sounds: Normal breath sounds.  Skin:    General: Skin is warm and dry.     Findings: Rash present.     Comments: Diffuse hyperpigmented skin rash throughout body Subcutaneous calcifications on bilateral arms worst over elbows and several fingers extensor surfaces Well healed right elbow wound  Neurological:     Mental Status: He is alert.  Psychiatric:        Mood and Affect: Mood normal.      Musculoskeletal Exam:  Elbows full ROM no tenderness or swelling Wrists full ROM no tenderness or swelling Fingers full ROM no tenderness or swelling Right hip internal rotation restricted compared to left, without focal tenderness Knees full ROM b/l, mild right knee tenderness to pressure over patellar tendon, no effusion Ankles full ROM no tenderness or swelling   Investigation: No  additional findings.  Imaging: No results found.  Recent Labs: Lab Results  Component Value Date   WBC 5.1 06/14/2022   HGB 13.9 06/14/2022   PLT 234 06/14/2022   NA 141 06/14/2022   K 4.0 06/14/2022   CL 107 06/14/2022   CO2 24 06/14/2022   GLUCOSE 92 06/14/2022   BUN 17 06/14/2022   CREATININE 1.19 06/14/2022   BILITOT 0.4 06/14/2022   ALKPHOS 45 09/21/2021   AST 26 06/14/2022   ALT 32 06/14/2022   PROT 6.7 06/14/2022   ALBUMIN 4.4 09/21/2021   CALCIUM 9.1 06/14/2022   GFRAA  02/01/2010    >60        The eGFR has been calculated using the MDRD equation. This calculation has not been validated in all clinical situations. eGFR's persistently <60 mL/min signify possible Chronic Kidney Disease.    Speciality Comments: No specialty comments available.  Procedures:  No procedures performed Allergies: Sulfonamide derivatives, Cephalexin, and Lisinopril   Assessment / Plan:     Visit Diagnoses: Dermatomyositis ([place]) - Plan: CK  So far no very  appreciable difference in symptoms with the methotrexate.  We will recheck CK level for inflammatory activity monitoring.  Plan to continue the methotrexate 15 mg p.o. weekly and continue folic acid 1 mg daily.  Joint pains in hip and knee more consistent with osteoarthritis no evidence of inflammatory joint disease.  Calcinosis cutis  Appears to be stable with numerous bulky nodules no obvious progression or new areas of involvement nor is there appreciable size reduction.  High risk medication use - Plan: CBC with Differential/Platelet, COMPLETE METABOLIC PANEL WITH GFR  Checking CBC and CMP for methotrexate medication monitoring.  Vitamin D deficiency - Plan: VITAMIN D 25 Hydroxy (Vit-D Deficiency, Fractures)  Checking vitamin D level see if he is adequately replaced.  Vitamin D deficiency can be associated with increase of autoimmune disease activity in the skin.  Orders: Orders Placed This Encounter  Procedures   CK   CBC with Differential/Platelet   COMPLETE METABOLIC PANEL WITH GFR   VITAMIN D 25 Hydroxy (Vit-D Deficiency, Fractures)   No orders of the defined types were placed in this encounter.    Follow-Up Instructions: Return in about 3 months (around 02/16/2022) for DM on MTX f/u 33mo.   CCollier Salina MD  Note - This record has been created using [employer]  Chart creation errors have been sought, but may not always  have been located. Such creation errors do not reflect on  the standard of medical care.

## 2021-11-18 ENCOUNTER — Other Ambulatory Visit: Payer: Self-pay

## 2021-11-18 ENCOUNTER — Encounter: Payer: Self-pay | Admitting: Internal Medicine

## 2021-11-18 ENCOUNTER — Ambulatory Visit (INDEPENDENT_AMBULATORY_CARE_PROVIDER_SITE_OTHER): Payer: Federal, State, Local not specified - PPO | Admitting: Internal Medicine

## 2021-11-18 VITALS — BP 148/92 | HR 77 | Resp 12 | Ht 67.0 in | Wt 258.4 lb

## 2021-11-18 DIAGNOSIS — M3390 Dermatopolymyositis, unspecified, organ involvement unspecified: Secondary | ICD-10-CM | POA: Diagnosis not present

## 2021-11-18 DIAGNOSIS — Z79899 Other long term (current) drug therapy: Secondary | ICD-10-CM | POA: Diagnosis not present

## 2021-11-18 DIAGNOSIS — E559 Vitamin D deficiency, unspecified: Secondary | ICD-10-CM

## 2021-11-18 DIAGNOSIS — L942 Calcinosis cutis: Secondary | ICD-10-CM

## 2021-11-19 LAB — CBC WITH DIFFERENTIAL/PLATELET
Absolute Monocytes: 311 cells/uL (ref 200–950)
Basophils Absolute: 122 cells/uL (ref 0–200)
Basophils Relative: 2.7 %
Eosinophils Absolute: 342 cells/uL (ref 15–500)
Eosinophils Relative: 7.6 %
HCT: 43.7 % (ref 38.5–50.0)
Hemoglobin: 14 g/dL (ref 13.2–17.1)
Lymphs Abs: 1521 cells/uL (ref 850–3900)
MCH: 27 pg (ref 27.0–33.0)
MCHC: 32 g/dL (ref 32.0–36.0)
MCV: 84.2 fL (ref 80.0–100.0)
MPV: 9.6 fL (ref 7.5–12.5)
Monocytes Relative: 6.9 %
Neutro Abs: 2205 cells/uL (ref 1500–7800)
Neutrophils Relative %: 49 %
Platelets: 243 10*3/uL (ref 140–400)
RBC: 5.19 10*6/uL (ref 4.20–5.80)
RDW: 12.4 % (ref 11.0–15.0)
Total Lymphocyte: 33.8 %
WBC: 4.5 10*3/uL (ref 3.8–10.8)

## 2021-11-19 LAB — COMPLETE METABOLIC PANEL WITH GFR
AG Ratio: 1.8 (calc) (ref 1.0–2.5)
ALT: 39 U/L (ref 9–46)
AST: 26 U/L (ref 10–40)
Albumin: 4.2 g/dL (ref 3.6–5.1)
Alkaline phosphatase (APISO): 44 U/L (ref 36–130)
BUN: 16 mg/dL (ref 7–25)
CO2: 30 mmol/L (ref 20–32)
Calcium: 9.4 mg/dL (ref 8.6–10.3)
Chloride: 106 mmol/L (ref 98–110)
Creat: 1.01 mg/dL (ref 0.60–1.26)
Globulin: 2.3 g/dL (calc) (ref 1.9–3.7)
Glucose, Bld: 91 mg/dL (ref 65–99)
Potassium: 4.8 mmol/L (ref 3.5–5.3)
Sodium: 140 mmol/L (ref 135–146)
Total Bilirubin: 0.3 mg/dL (ref 0.2–1.2)
Total Protein: 6.5 g/dL (ref 6.1–8.1)
eGFR: 97 mL/min/{1.73_m2} (ref >=60)

## 2021-11-19 LAB — VITAMIN D 25 HYDROXY (VIT D DEFICIENCY, FRACTURES): Vit D, 25-Hydroxy: 25 ng/mL — ABNORMAL LOW (ref 30–100)

## 2021-11-19 LAB — CK: Total CK: 399 U/L — ABNORMAL HIGH (ref 44–196)

## 2021-11-20 NOTE — Progress Notes (Signed)
His CK level is not significantly changed since starting the methotrexate. I recommend he keep taking this for now so we can get a better idea if it will help for the skin and there are no signs of medication side effect. His vitamin D remains mildly low at 25. He should start taking a supplement daily if he is not or increase his amount by 1000 units.

## 2021-12-29 ENCOUNTER — Other Ambulatory Visit: Payer: Self-pay

## 2021-12-29 ENCOUNTER — Encounter: Payer: Self-pay | Admitting: Internal Medicine

## 2021-12-29 ENCOUNTER — Ambulatory Visit: Payer: Federal, State, Local not specified - PPO | Admitting: Internal Medicine

## 2021-12-29 VITALS — BP 140/90 | HR 78 | Temp 98.5°F | Ht 67.0 in | Wt 261.0 lb

## 2021-12-29 DIAGNOSIS — I10 Essential (primary) hypertension: Secondary | ICD-10-CM

## 2021-12-29 DIAGNOSIS — J4531 Mild persistent asthma with (acute) exacerbation: Secondary | ICD-10-CM | POA: Diagnosis not present

## 2021-12-29 MED ORDER — METHYLPREDNISOLONE 4 MG PO TBPK: ORAL_TABLET | ORAL | 0 refills | Status: DC

## 2021-12-29 NOTE — Assessment & Plan Note (Signed)
Chronic ?Blood pressure high today for him-ideally needs to be 130/80 or lower, but he is sick so we will hold off on making any adjustments today ?Diltiazem to 40 mg daily, HCTZ 25 mg daily, losartan 100 mg daily ?Reevaluate in May at his follow-up appointment ?

## 2021-12-29 NOTE — Patient Instructions (Addendum)
? ? ? ?  Medications changes include :   start an anti-histamine daily - Claritin or allegra in the morning OR zyrtec at night.   ? ?Take the steroid for a few days.   ? ? ?Your prescription(s) have been sent to your pharmacy.  ? ? ?A referral was ordered for allergy.     Someone from that office will call you to schedule an appointment.  ? ?

## 2021-12-29 NOTE — Assessment & Plan Note (Signed)
Chronic asthma with acute exacerbation ?Symptoms include increased coughing to the point where he is developing headaches or vomiting and a few episodes of chest tightness.  He states only 1 night of some shortness of breath and wheezing ??  Allergic rhinitis which is causing a flare of his asthma ?No real evidence of infection ?Inhalers are helping, but still symptomatic ?I think it would be a good idea for him to see an allergist-referral ordered ?Start oral antihistamine daily ?He is looking for some quick relief because of the severity of the cough-we will prescribe a Medrol Dosepak ?Advised him to let me know if his symptoms do not improve or worsen ?

## 2021-12-29 NOTE — Progress Notes (Signed)
? ? ?Subjective:  ? ? Patient ID: Evan Burns, male    DOB: 11-Sep-1981, 41 y.o.   MRN: [id number] ? ?This visit occurred during the SARS-CoV-2 public health emergency.  Safety protocols were in place, including screening questions prior to the visit, additional usage of staff PPE, and extensive cleaning of exam room while observing appropriate contact time as indicated for disinfecting solutions. ? ? ? ?HPI ?Evan Burns is here for  ?Chief Complaint  ?Patient presents with  ? Cough  ? ? ? ? ?He started having a cough about 10 days ago.  Sometimes he coughs so much it causes a headache or will vomit.   ? ? ?Started Nexium b/c of the cough - denies reflux prior to starting the medication, but no is a possibility- no change after 14 days.  He stopped taking the medication. ? ? ?He uses the Symbicort twice daily.  For the past week has used the albuterol twice a week.   It helps temporally but cough comes back. ? ?He now has a dog in his house - the dog has been there the past year - yorkie.  He denies any obvious allergies when the dog first came into the house. ? ? ?Medications and allergies reviewed with patient and updated if appropriate. ? ?Current Outpatient Medications on File Prior to Visit  ?Medication Sig Dispense Refill  ? albuterol (VENTOLIN HFA) 108 (90 Base) MCG/ACT inhaler Inhale 2 puffs into the lungs every 6 (six) hours as needed for wheezing or shortness of breath. 18 g 11  ? budesonide-formoterol (SYMBICORT) 160-4.5 MCG/ACT inhaler Inhale 2 puffs into the lungs 2 (two) times daily. 1 each 8  ? Butenafine HCl 1 % cream Apply twice daily to affected area 30 g 0  ? COD LIVER OIL PO Take 1 capsule by mouth daily.    ? Creatine POWD Take 1 Scoop by mouth.    ? diltiazem (CARDIZEM LA) 240 MG 24 hr tablet SMARTSIG:1 Tablet(s) By Mouth    ? diltiazem (DILACOR XR) 240 MG 24 hr capsule Take 1 capsule (240 mg total) by mouth daily. 30 capsule 5  ? folic acid (FOLVITE) 1 MG tablet Take 1 tablet (1 mg total) by  mouth daily. 90 tablet 0  ? hydrochlorothiazide (HYDRODIURIL) 25 MG tablet Take 1 tablet (25 mg total) by mouth daily. 90 tablet 3  ? hydrocortisone 2.5 % cream Apply topically.    ? losartan (COZAAR) 100 MG tablet take 1 tablet by mouth once daily. 90 tablet 1  ? mometasone (ELOCON) 0.1 % cream Apply topically.    ? naproxen (NAPROSYN) 500 MG tablet Take 1 tablet (500 mg total) by mouth 2 (two) times daily. 30 tablet 0  ? Omega-3 Fatty Acids (FISH OIL) 1000 MG CAPS Take 1,000 mg by mouth daily.    ? tacrolimus (PROTOPIC) 0.03 % ointment Apply 1 application topically 2 (two) times daily.    ? triamcinolone cream (KENALOG) 0.1 % Apply topically.    ? VITAMIN D PO Take by mouth daily.    ? ?No current facility-administered medications on file prior to visit.  ? ? ?Review of Systems  ?Constitutional:  Negative for chills and fever.  ?HENT:  Negative for congestion, ear pain, postnasal drip, sinus pressure, sinus pain, sore throat and trouble swallowing.   ?Respiratory:  Positive for cough, chest tightness (a couple of times), shortness of breath (one night) and wheezing (one night).   ?Gastrointestinal:  Positive for vomiting (a few times after coughing  a lot). Negative for nausea.  ?     No reflux  ?Neurological:  Positive for headaches (just from coughing). Negative for light-headedness.  ? ?   ?Objective:  ? ?Vitals:  ? 12/29/21 1419  ?BP: 140/90  ?Pulse: 78  ?Temp: 98.5 ?F (36.9 ?C)  ?SpO2: 99%  ? ?BP Readings from Last 3 Encounters:  ?12/29/21 140/90  ?11/18/21 (!) 148/92  ?10/06/21 134/82  ? ?Wt Readings from Last 3 Encounters:  ?12/29/21 261 lb (118.4 kg)  ?11/18/21 258 lb 6.4 oz (117.2 kg)  ?10/01/21 249 lb 12.8 oz (113.3 kg)  ? ?Body mass index is 40.88 kg/m?. ? ?  ?Physical Exam ?Constitutional:   ?   General: He is not in acute distress. ?   Appearance: Normal appearance. He is not ill-appearing.  ?HENT:  ?   Head: Normocephalic.  ?   Right Ear: Tympanic membrane, ear canal and external ear normal. There is  no impacted cerumen.  ?   Left Ear: Tympanic membrane, ear canal and external ear normal. There is no impacted cerumen.  ?   Mouth/Throat:  ?   Mouth: Mucous membranes are moist.  ?   Pharynx: No oropharyngeal exudate or posterior oropharyngeal erythema.  ?Eyes:  ?   Conjunctiva/sclera: Conjunctivae normal.  ?Cardiovascular:  ?   Rate and Rhythm: Normal rate and regular rhythm.  ?Pulmonary:  ?   Effort: Pulmonary effort is normal. No respiratory distress.  ?   Breath sounds: Normal breath sounds. No wheezing or rales.  ?Musculoskeletal:  ?   Cervical back: Neck supple. No tenderness.  ?Lymphadenopathy:  ?   Cervical: No cervical adenopathy.  ?Skin: ?   General: Skin is warm and dry.  ?   Findings: No rash.  ?Neurological:  ?   Mental Status: He is alert.  ? ?   ? ? ? ? ? ?Assessment & Plan:  ? ? ?See Problem List for Assessment and Plan of chronic medical problems.  ? ? ? ? ?

## 2022-01-08 ENCOUNTER — Encounter: Payer: Self-pay | Admitting: Internal Medicine

## 2022-01-08 ENCOUNTER — Telehealth (INDEPENDENT_AMBULATORY_CARE_PROVIDER_SITE_OTHER): Payer: Federal, State, Local not specified - PPO | Admitting: Nurse Practitioner

## 2022-01-08 ENCOUNTER — Encounter: Payer: Self-pay | Admitting: Nurse Practitioner

## 2022-01-08 DIAGNOSIS — U071 COVID-19: Secondary | ICD-10-CM | POA: Diagnosis not present

## 2022-01-08 MED ORDER — PROMETHAZINE-DM 6.25-15 MG/5ML PO SYRP: 5.0000 mL | ORAL_SOLUTION | Freq: Three times a day (TID) | ORAL | 0 refills | Status: DC | PRN

## 2022-01-08 MED ORDER — MOLNUPIRAVIR EUA 200MG CAPSULE: 4.0000 | ORAL_CAPSULE | Freq: Two times a day (BID) | ORAL | 0 refills | Status: AC

## 2022-01-08 NOTE — Patient Instructions (Signed)
Molnupiravir Oral Capsules °What is this medication? °MOLNUPIRAVIR (mol nue pir a vir) treats COVID-19. It is an antiviral medication. It may decrease the risk of developing severe symptoms of COVID-19. It may also decrease the chance of going to the hospital. This medication is not approved by the FDA. The FDA has authorized emergency use of this medication during the COVID-19 pandemic. °This medicine may be used for other purposes; ask your health care provider or pharmacist if you have questions. °COMMON BRAND NAME(S): LAGEVRIO °What should I tell my care team before I take this medication? °They need to know if you have any of these conditions: °Any allergies °Any serious illness °An unusual or allergic reaction to molnupiravir, other medications, foods, dyes, or preservatives °Pregnant or trying to get pregnant °Breast-feeding °How should I use this medication? °Take this medication by mouth with water. Take it as directed on the prescription label at the same time every day. Do not cut, crush or chew this medication. Swallow the capsules whole. You can take it with or without food. If it upsets your stomach, take it with food. Take all of this medication unless your care team tells you to stop it early. Keep taking it even if you think you are better. °Talk to your care team about the use of this medication in children. Special care may be needed. °Overdosage: If you think you have taken too much of this medicine contact a poison control center or emergency room at once. °NOTE: This medicine is only for you. Do not share this medicine with others. °What if I miss a dose? °If you miss a dose, take it as soon as you can unless it is more than 10 hours late. If it is more than 10 hours late, skip the missed dose. Take the next dose at the normal time. Do not take extra or 2 doses at the same time to make up for the missed dose. °What may interact with this medication? °Interactions have not been studied. °This  list may not describe all possible interactions. Give your health care provider a list of all the medicines, herbs, non-prescription drugs, or dietary supplements you use. Also tell them if you smoke, drink alcohol, or use illegal drugs. Some items may interact with your medicine. °What should I watch for while using this medication? °Your condition will be monitored carefully while you are receiving this medication. Visit your care team for regular checkups. Tell your care team if your symptoms do not start to get better or if they get worse. °Do not become pregnant while taking this medication. You may need a pregnancy test before starting this medication. Women must use a reliable form of birth control while taking this medication and for 4 days after stopping the medication. Women should inform their care team if they wish to become pregnant or think they might be pregnant. Men should not father a child while taking this medication and for 3 months after stopping it. There is potential for serious harm to an unborn child. Talk to your care team for more information. Do not breast-feed an infant while taking this medication and for 4 days after stopping the medication. °What side effects may I notice from receiving this medication? °Side effects that you should report to your care team as soon as possible: °Allergic reactions--skin rash, itching, hives, swelling of the face, lips, tongue, or throat °Side effects that usually do not require medical attention (report these to your care team if they   continue or are bothersome): °Diarrhea °Dizziness °Nausea °This list may not describe all possible side effects. Call your doctor for medical advice about side effects. You may report side effects to FDA at 1-800-FDA-1088. °Where should I keep my medication? °Keep out of the reach of children and pets. °Store at room temperature between 20 and 25 degrees C (68 and 77 degrees F). Get rid of any unused medication after the  expiration date. °To get rid of medications that are no longer needed or have expired: °Take the medication to a medication take-back program. Check with your pharmacy or law enforcement to find a location. °If you cannot return the medication, check the label or package insert to see if the medication should be thrown out in the garbage or flushed down the toilet. If you are not sure, ask your care team. If it is safe to put it in the trash, take the medication out of the container. Mix the medication with cat litter, dirt, coffee grounds, or other unwanted substance. Seal the mixture in a bag or container. Put it in the trash. °NOTE: This sheet is a summary. It may not cover all possible information. If you have questions about this medicine, talk to your doctor, pharmacist, or health care provider. °© 2022 [employer] (2020-10-20 00:00:00) ° °

## 2022-01-08 NOTE — Assessment & Plan Note (Signed)
He is within the window for antiviral medication.  He would like to try this, and I think this is reasonable considering his history of asthma and hypertension.  He is on diltiazem which interacts with Paxlovid so we will treat with molnupiravir.  We will also provide cough suppression with as needed promethazine dextromethorphan.  He was told not to drive will take this medication and avoid any heavy machinery use.  We also discussed isolation recommendations.  He is requesting work note which I will provide to him today.  He is encouraged to proceed to the emergency department over the weekend if symptoms get progressively worse especially if he experiences shortness of breath, he reports understanding. ?

## 2022-01-08 NOTE — Progress Notes (Signed)
? ?An audio-only tele-health visit was completed today for this patient. I connected with  [name] on 01/08/22 utilizing audio-only technology and verified that I am speaking with the correct person using two identifiers. The patient was located at their home, and I was located at the office of [facility] at [facility] during the encounter. I discussed the limitations of evaluation and management by telemedicine. The patient expressed understanding and agreed to proceed.  ? ? ? ?Subjective:  ?Patient ID: Evan Burns, male    DOB: 06/02/1981  Age: 41 y.o. MRN: [id number] ? ?CC:  ?Chief Complaint  ?Patient presents with  ? Covid Positive  ?  ? ? ?HPI  ?This patient arrives today for virtual visit for the above. ? ?She reports symptoms started about 2 days ago, and he tested positive for COVID-19 yesterday.  He is experiencing headache, cough, shortness of breath with exertion, and sputum production.  He has been vaccinated for COVID-19 4 times.  He does have history of asthma and hypertension. ? ?Past Medical History:  ?Diagnosis Date  ? Arthritis of right hip 09/16/2020  ? X-ray October 2021  ? Asthma   ? Dermatomyositis (HCC)   ? Hypertension   ? ? ? ? ?Family History  ?Problem Relation Age of Onset  ? Hypertension Mother   ? Diabetes Mother   ? Kidney disease Mother   ? Hyperlipidemia Mother   ? Arthritis Mother   ? Hypertension Father   ? Hyperlipidemia Father   ? ? ?Social History  ? ?Social History Narrative  ? Pt lives at home w/ parents   ?   ? Very active at work - works two jobs  ? ?Social History  ? ?Tobacco Use  ? Smoking status: Never  ? Smokeless tobacco: Never  ?Substance Use Topics  ? Alcohol use: No  ? ? ? ?Current Meds  ?Medication Sig  ? albuterol (VENTOLIN HFA) 108 (90 Base) MCG/ACT inhaler Inhale 2 puffs into the lungs every 6 (six) hours as needed for wheezing or shortness of breath.  ? budesonide-formoterol (SYMBICORT) 160-4.5 MCG/ACT inhaler Inhale 2 puffs into the  lungs 2 (two) times daily.  ? Butenafine HCl 1 % cream Apply twice daily to affected area  ? COD LIVER OIL PO Take 1 capsule by mouth daily.  ? Creatine POWD Take 1 Scoop by mouth.  ? diltiazem (CARDIZEM LA) 240 MG 24 hr tablet SMARTSIG:1 Tablet(s) By Mouth  ? diltiazem (DILACOR XR) 240 MG 24 hr capsule Take 1 capsule (240 mg total) by mouth daily.  ? folic acid (FOLVITE) 1 MG tablet Take 1 tablet (1 mg total) by mouth daily.  ? hydrochlorothiazide (HYDRODIURIL) 25 MG tablet Take 1 tablet (25 mg total) by mouth daily.  ? hydrocortisone 2.5 % cream Apply topically.  ? losartan (COZAAR) 100 MG tablet take 1 tablet by mouth once daily.  ? molnupiravir EUA (LAGEVRIO) 200 mg CAPS capsule Take 4 capsules (800 mg total) by mouth 2 (two) times daily for 5 days.  ? mometasone (ELOCON) 0.1 % cream Apply topically.  ? naproxen (NAPROSYN) 500 MG tablet Take 1 tablet (500 mg total) by mouth 2 (two) times daily.  ? Omega-3 Fatty Acids (FISH OIL) 1000 MG CAPS Take 1,000 mg by mouth daily.  ? promethazine-dextromethorphan (PROMETHAZINE-DM) 6.25-15 MG/5ML syrup Take 5 mLs by mouth 3 (three) times daily as needed for cough.  ? tacrolimus (PROTOPIC) 0.03 % ointment Apply 1 application topically 2 (two) times daily.  ?  triamcinolone cream (KENALOG) 0.1 % Apply topically.  ? VITAMIN D PO Take by mouth daily.  ? ? ?ROS:  ?Review of Systems  ?Constitutional:  Positive for chills and malaise/fatigue. Negative for fever.  ?HENT:  Positive for sore throat.   ?Respiratory:  Positive for cough, sputum production and shortness of breath (with exertion). Negative for wheezing.   ?Cardiovascular:  Negative for chest pain.  ?Musculoskeletal:  Positive for myalgias.  ?Neurological:  Positive for headaches.  ? ? ?Objective:  ? ?Today's Vitals: There were no vitals taken for this visit. ?Vitals with BMI 01/08/2022 12/29/2021 11/18/2021  ?Height - 5\' 7"  5\' 7"   ?Weight - 261 lbs 258 lbs 6 oz  ?BMI - 40.87 40.46  ?Systolic (No Data) 140 148  ?Diastolic (No  Data) 90 92  ?Pulse - 78 77  ?  ? ?Physical Exam ?Comprehensive physical exam not completed today as office visit was conducted remotely.  Patient sounds well over the phone, he has coughed a couple times.  He is able to speak in complete sentences without having to stop to breathe..  Patient was alert and oriented, and appeared to have appropriate judgment. ? ? ? ? ? ? ?Assessment and Plan  ? ?1. COVID-19   ? ? ? ?Plan: ?See plan via problem list below. ? ? ? ?Tests ordered ?No orders of the defined types were placed in this encounter. ? ? ? ? ?Meds ordered this encounter  ?Medications  ? molnupiravir EUA (LAGEVRIO) 200 mg CAPS capsule  ?  Sig: Take 4 capsules (800 mg total) by mouth 2 (two) times daily for 5 days.  ?  Dispense:  40 capsule  ?  Refill:  0  ?  Order Specific Question:   Supervising Provider  ?  Answer:     ? promethazine-dextromethorphan (PROMETHAZINE-DM) 6.25-15 MG/5ML syrup  ?  Sig: Take 5 mLs by mouth 3 (three) times daily as needed for cough.  ?  Dispense:  180 mL  ?  Refill:  0  ?  Order Specific Question:   Supervising Provider  ?  Answer:   [[id number]] 12-27-1989  ? ? ?Patient to follow-up as needed.  Total time spent the telephone today was 13 minutes and 14 seconds. ? ?[name], NP ? ?

## 2022-02-16 ENCOUNTER — Ambulatory Visit: Payer: Federal, State, Local not specified - PPO | Admitting: Internal Medicine

## 2022-03-01 NOTE — Progress Notes (Signed)
? ?Office Visit Note ? ?Patient: Evan Burns             ?Date of Birth: December 31, 1980           ?MRN: [id number]             ?PCP: Binnie Rail, MD ?Referring: Binnie Rail, MD ?Visit Date: 03/09/2022 ? ? ?Subjective:  ?Follow-up (Right hip pain, bil leg weakness, right knee pain) ? ? ?History of Present Illness: Evan Burns is a 41 y.o. male here for follow up for dermatomyositis with chronic calcinosis cutis on methotrexate 15 mg PO weekly and folic acid 1 mg daily. He is tolerating methotrexate without any major problems. He has developed increased skin peeling and itching on his face and neck. He has persistent right hip and knee pain and has some stiffness worst first thing in the morning. He notices upper leg weakness about 2 days out of the week. ? ?Previous HPI ?11/18/2021 ?Evan Burns is a 41 y.o. male here for follow up for dermatomyositis with chronic calcinosis cutis on methotrexate 15 mg PO weekly and folic acid 1 mg daily. He continues to have right hip pain also having right knee pain. Notices some fatigue when taking the methotrexate, no other intolerance or side effect notices. He saw Dr. Tempie Donning for treatment of right elbow abscess that has healed well no ongoing pain or inflammation and off antibiotics. No other infections or major events. ?  ?Previous HPI ?08/24/21 ?Evan Burns is a 41 y.o. male here for follow up for dermatomyositis with chronic calcinosis cutis on methotrexate 15 mg PO weekly since about 3 months ago. He has not noticed any difference since our last visit. On further discission he was apparently taking only 1 tablet weekly of the methotrexate so far below recommended dose. He had some new skin peeling over his face increased since this summer and saw dermatology for this multiple times. They tried a few topical medication without response but now using tacrolimus with improvement. ?  ?Previous HPI ?10/02/2020 ?History of Present Illness: Evan Burns is a  41 y.o. male with a history of dermatomyositis here for evaluation of myalgias and skin problems. He has a long history of DM diagnosed around 20 years ago originally with muscle and skin disease activity. He was treated with steroids for this with apparent resolution of his muscle inflammation. Since that time he has had continued development of calcifications that sometimes become swollen or infected but no known recurrence of muscular involvement. He takes treatments for asthma but no known ILD. Today he complains of joint pain in multiple sites the right groin, left ankle, and right shoulder. He has had previous imaging of the shoulder and hip showing right shoulder bursitis and moderately advanced OA of the right hip. He has not had imaging for the left ankle pain. He does see swelling around the left ankle sometimes and worse after working all day. He has not noticed any focal weakness except around his right hip and thigh. He denies cough, dyspnea, fevers, weight loss, or lymphadenopathy. ? ? ?Review of Systems  ?Constitutional:  Positive for fatigue.  ?HENT:  Negative for mouth dryness.   ?Eyes:  Negative for dryness.  ?Respiratory:  Negative for shortness of breath.   ?Cardiovascular:  Positive for swelling in legs/feet.  ?Gastrointestinal:  Negative for constipation.  ?Endocrine: Negative for excessive thirst.  ?Genitourinary:  Negative for difficulty urinating.  ?Musculoskeletal:  Positive for joint pain, gait  problem, joint pain, joint swelling, muscle weakness, morning stiffness and muscle tenderness.  ?Skin:  Negative for rash.  ?Allergic/Immunologic: Negative for susceptible to infections.  ?Neurological:  Positive for weakness.  ?Hematological:  Negative for bruising/bleeding tendency.  ?Psychiatric/Behavioral:  Negative for sleep disturbance.   ? ?PMFS History:  ?Patient Active Problem List  ? Diagnosis Date Noted  ? Facial rash 03/09/2022  ? COVID-19 01/08/2022  ? Abscess of left elbow 10/01/2021   ? High risk medication use 06/01/2021  ? Vitamin D deficiency 03/19/2021  ? Lower leg edema 01/09/2021  ? Pain in left ankle and joints of left foot 10/02/2020  ? Calcinosis cutis 10/02/2020  ? Arthritis of right hip 09/16/2020  ? Tinea cruris 07/28/2020  ? Leg pain, bilateral 07/28/2020  ? Prediabetes 03/19/2020  ? Chest mass 02/14/2019  ? Other chest pain 02/14/2019  ? Superficial phlebitis, chest wall 02/14/2019  ? Fatigue 11/22/2018  ? Cough 11/08/2015  ? Essential hypertension 10/12/2010  ? Dermatomyositis ([place]) 06/17/2008  ? Asthma 02/22/2007  ?  ?Past Medical History:  ?Diagnosis Date  ? Arthritis of right hip 09/16/2020  ? X-ray October 2021  ? Asthma   ? Dermatomyositis ([place])   ? Hypertension   ?  ?Family History  ?Problem Relation Age of Onset  ? Hypertension Mother   ? Diabetes Mother   ? Kidney disease Mother   ? Hyperlipidemia Mother   ? Arthritis Mother   ? Hypertension Father   ? Hyperlipidemia Father   ? ?Past Surgical History:  ?Procedure Laterality Date  ? HAND SURGERY    ? INCISION AND DRAINAGE OF WOUND    ? MASS EXCISION Left 07/27/2019  ? Procedure: LEFT LONG FINGER NODULE EXCISION;  Surgeon: Leandrew Koyanagi, MD;  Location: [facility];  Service: Orthopedics;  Laterality: Left;  ? TYMPANOSTOMY TUBE PLACEMENT    ? ?Social History  ? ?Social History Narrative  ? Pt lives at home w/ parents   ?   ? Very active at work - works two jobs  ? ?Immunization History  ?Administered Date(s) Administered  ? Influenza Split 08/25/2011, 07/25/2012  ? Influenza Whole 08/08/2007, 08/14/2008, 07/23/2009, 07/21/2010  ? Influenza,inj,Quad PF,6+ Mos 07/24/2013, 07/23/2014, 07/01/2015, 07/05/2016, 08/06/2017, 08/04/2018, 09/07/2019, 07/28/2020, 07/03/2021  ? PFIZER(Purple Top)SARS-COV-2 Vaccination 01/07/2020, 01/28/2020, 09/22/2020  ? 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 Pneumococcal Conjugate-13 09/21/2021  ? Pneumococcal Polysaccharide-23 10/01/2014  ? Tdap 09/13/2013,  04/18/2019  ?  ? ?Objective: ?Vital Signs: BP (!) 143/75 (BP Location: Left Arm, Patient Position: Sitting, Cuff Size: Normal)   Pulse 85   Resp 15   Ht 5' 7" (1.702 m)   Wt 260 lb (117.9 kg)   BMI 40.72 kg/m?   ? ?Physical Exam ?Cardiovascular:  ?   Rate and Rhythm: Normal rate and regular rhythm.  ?Pulmonary:  ?   Effort: Pulmonary effort is normal.  ?   Breath sounds: Normal breath sounds.  ?Musculoskeletal:  ?   Right lower leg: No edema.  ?   Left lower leg: No edema.  ?Skin: ?   General: Skin is warm and dry.  ?   Findings: Rash present.  ?   Comments: Hyperpigmented changes on face with few areas more erythematous and skin peeling around temporal areas and eyes and brow ?Numerous calcified nodules on elbows and forearms and hands  ?Neurological:  ?   Mental Status: He is alert.  ?   Motor: No weakness.  ?Psychiatric:     ?  Mood and Affect: Mood normal.  ?  ? ?Musculoskeletal Exam:  ?Shoulders full ROM no tenderness or swelling ?Elbows full ROM no tenderness or swelling ?Wrists full ROM no tenderness or swelling ?Fingers full ROM no tenderness or swelling ?Right hip decreased internal rotation and FADIR movement provokes anterior and groin pain, no lateral hip tenderness to palpation ?Knees full ROM no tenderness or swelling ? ?Investigation: ?No additional findings. ? ?Imaging: ?No results found. ? ?Recent Labs: ?Lab Results  ?Component Value Date  ? WBC 4.5 11/18/2021  ? HGB 14.0 11/18/2021  ? PLT 243 11/18/2021  ? NA 140 11/18/2021  ? K 4.8 11/18/2021  ? CL 106 11/18/2021  ? CO2 30 11/18/2021  ? GLUCOSE 91 11/18/2021  ? BUN 16 11/18/2021  ? CREATININE 1.01 11/18/2021  ? BILITOT 0.3 11/18/2021  ? ALKPHOS 45 09/21/2021  ? AST 26 11/18/2021  ? ALT 39 11/18/2021  ? PROT 6.5 11/18/2021  ? ALBUMIN 4.4 09/21/2021  ? CALCIUM 9.4 11/18/2021  ? GFRAA  02/01/2010  ?  >60        ?The eGFR has been calculated ?using the MDRD equation. ?This calculation has not been ?validated in all clinical ?situations. ?eGFR's  persistently ?<60 mL/min signify ?possible Chronic Kidney Disease.  ? ? ?Speciality Comments: No specialty comments available. ? ?Procedures:  ?No procedures performed ?Allergies: Sulfonamide derivatives, Ceph

## 2022-03-09 ENCOUNTER — Encounter: Payer: Self-pay | Admitting: Internal Medicine

## 2022-03-09 ENCOUNTER — Ambulatory Visit (INDEPENDENT_AMBULATORY_CARE_PROVIDER_SITE_OTHER): Payer: Federal, State, Local not specified - PPO | Admitting: Internal Medicine

## 2022-03-09 VITALS — BP 143/75 | HR 85 | Resp 15 | Ht 67.0 in | Wt 260.0 lb

## 2022-03-09 DIAGNOSIS — R21 Rash and other nonspecific skin eruption: Secondary | ICD-10-CM | POA: Insufficient documentation

## 2022-03-09 DIAGNOSIS — M3390 Dermatopolymyositis, unspecified, organ involvement unspecified: Secondary | ICD-10-CM

## 2022-03-09 DIAGNOSIS — M1611 Unilateral primary osteoarthritis, right hip: Secondary | ICD-10-CM | POA: Diagnosis not present

## 2022-03-09 DIAGNOSIS — Z79899 Other long term (current) drug therapy: Secondary | ICD-10-CM | POA: Diagnosis not present

## 2022-03-09 DIAGNOSIS — E559 Vitamin D deficiency, unspecified: Secondary | ICD-10-CM

## 2022-03-09 MED ORDER — FOLIC ACID 1 MG PO TABS: 1.0000 mg | ORAL_TABLET | Freq: Every day | ORAL | 1 refills | Status: DC

## 2022-03-09 MED ORDER — METHOTREXATE 2.5 MG PO TABS: 20.0000 mg | ORAL_TABLET | ORAL | 2 refills | Status: DC

## 2022-03-09 MED ORDER — MELOXICAM 7.5 MG PO TABS: ORAL_TABLET | ORAL | 2 refills | Status: DC

## 2022-03-10 LAB — CBC WITH DIFFERENTIAL/PLATELET
Absolute Monocytes: 390 cells/uL (ref 200–950)
Basophils Absolute: 120 cells/uL (ref 0–200)
Basophils Relative: 2.3 %
Eosinophils Absolute: 312 cells/uL (ref 15–500)
Eosinophils Relative: 6 %
HCT: 44.3 % (ref 38.5–50.0)
Hemoglobin: 14.3 g/dL (ref 13.2–17.1)
Lymphs Abs: 1955 cells/uL (ref 850–3900)
MCH: 26.8 pg — ABNORMAL LOW (ref 27.0–33.0)
MCHC: 32.3 g/dL (ref 32.0–36.0)
MCV: 83 fL (ref 80.0–100.0)
MPV: 9.4 fL (ref 7.5–12.5)
Monocytes Relative: 7.5 %
Neutro Abs: 2423 cells/uL (ref 1500–7800)
Neutrophils Relative %: 46.6 %
Platelets: 239 10*3/uL (ref 140–400)
RBC: 5.34 10*6/uL (ref 4.20–5.80)
RDW: 12.2 % (ref 11.0–15.0)
Total Lymphocyte: 37.6 %
WBC: 5.2 10*3/uL (ref 3.8–10.8)

## 2022-03-10 LAB — COMPLETE METABOLIC PANEL WITH GFR
AG Ratio: 1.9 (calc) (ref 1.0–2.5)
ALT: 35 U/L (ref 9–46)
AST: 24 U/L (ref 10–40)
Albumin: 4.3 g/dL (ref 3.6–5.1)
Alkaline phosphatase (APISO): 44 U/L (ref 36–130)
BUN: 15 mg/dL (ref 7–25)
CO2: 28 mmol/L (ref 20–32)
Calcium: 9.1 mg/dL (ref 8.6–10.3)
Chloride: 106 mmol/L (ref 98–110)
Creat: 1.18 mg/dL (ref 0.60–1.29)
Globulin: 2.3 g/dL (calc) (ref 1.9–3.7)
Glucose, Bld: 86 mg/dL (ref 65–99)
Potassium: 4.4 mmol/L (ref 3.5–5.3)
Sodium: 142 mmol/L (ref 135–146)
Total Bilirubin: 0.4 mg/dL (ref 0.2–1.2)
Total Protein: 6.6 g/dL (ref 6.1–8.1)
eGFR: 80 mL/min/{1.73_m2} (ref >=60)

## 2022-03-10 LAB — CK: Total CK: 488 U/L — ABNORMAL HIGH (ref 44–196)

## 2022-03-10 NOTE — Progress Notes (Signed)
Lab results look fine for continuing the methotrexate. His CK number is mildly increased to 488 I am not sure if this is directly related to episodes of leg weakness but may indicate inflammatory activity. He can try increasing the methotrexate dose as planned.

## 2022-03-11 ENCOUNTER — Other Ambulatory Visit: Payer: Self-pay | Admitting: Internal Medicine

## 2022-03-11 ENCOUNTER — Ambulatory Visit
Admission: EM | Admit: 2022-03-11 | Discharge: 2022-03-11 | Disposition: A | Discharge status: Home / Self Care | Payer: Federal, State, Local not specified - PPO | Attending: Emergency Medicine | Admitting: Emergency Medicine

## 2022-03-11 DIAGNOSIS — R519 Headache, unspecified: Secondary | ICD-10-CM | POA: Diagnosis not present

## 2022-03-11 DIAGNOSIS — I10 Essential (primary) hypertension: Secondary | ICD-10-CM | POA: Diagnosis not present

## 2022-03-11 MED ORDER — KETOROLAC TROMETHAMINE 60 MG/2ML IM SOLN
30.0000 mg | Freq: Once | INTRAMUSCULAR | Status: AC
  Administered 2022-03-11: 30 mg via INTRAMUSCULAR

## 2022-03-11 NOTE — ED Provider Notes (Signed)
[name]    CSN: [id number] Arrival date & time: 03/11/22  0805     History   Chief Complaint Chief Complaint  Patient presents with   Headache   Hypertension    HPI Evan Burns is a 41 y.o. male.  Presents with headache that began yesterday morning. States 8/10 that resolved to 2/10 this morning. He tried aleve that helped but headache came back when meds wore off. Took BP with moms cuff last night and noticed systolic 170s. Takes losartan and diltiazem for pressures. Usually runs 125-135 systolic. Denies dizziness, vision changes, light-headedness, chest pain/tightness, abdominal pain, vomiting/diarrhea, urinary symptoms, weakness, numbness/tingling.   Past Medical History:  Diagnosis Date   Arthritis of right hip 09/16/2020   X-ray October 2021   Asthma    Dermatomyositis [facility])    Hypertension     Patient Active Problem List   Diagnosis Date Noted   Facial rash 03/09/2022   COVID-19 01/08/2022   Abscess of left elbow 10/01/2021   High risk medication use 06/01/2021   Vitamin D deficiency 03/19/2021   Lower leg edema 01/09/2021   Pain in left ankle and joints of left foot 10/02/2020   Calcinosis cutis 10/02/2020   Arthritis of right hip 09/16/2020   Tinea cruris 07/28/2020   Leg pain, bilateral 07/28/2020   Prediabetes 03/19/2020   Chest mass 02/14/2019   Other chest pain 02/14/2019   Superficial phlebitis, chest wall 02/14/2019   Fatigue 11/22/2018   Cough 11/08/2015   Essential hypertension 10/12/2010   Dermatomyositis (HCC) 06/17/2008   Asthma 02/22/2007    Past Surgical History:  Procedure Laterality Date   HAND SURGERY     INCISION AND DRAINAGE OF WOUND     MASS EXCISION Left 07/27/2019   Procedure: LEFT LONG FINGER NODULE EXCISION;  Surgeon: Tarry Kos, MD;  Location: [facility];  Service: Orthopedics;  Laterality: Left;   TYMPANOSTOMY TUBE PLACEMENT        Home Medications    Prior to Admission  medications   Medication Sig Start Date End Date Taking? Authorizing Provider  albuterol (VENTOLIN HFA) 108 (90 Base) MCG/ACT inhaler Inhale 2 puffs into the lungs every 6 (six) hours as needed for wheezing or shortness of breath. 08/25/21   [name], MD  budesonide-formoterol (SYMBICORT) 160-4.5 MCG/ACT inhaler Inhale 2 puffs into the lungs 2 (two) times daily. 09/21/21   [name], MD  Butenafine HCl 1 % cream Apply twice daily to affected area 07/28/20   [name], MD  COD LIVER OIL PO Take 1 capsule by mouth daily.    [provider]  Creatine POWD Take 1 Scoop by mouth.    [provider]  diltiazem (CARDIZEM LA) 240 MG 24 hr tablet SMARTSIG:1 Tablet(s) By Mouth 07/21/21   [provider]  diltiazem (DILACOR XR) 240 MG 24 hr capsule Take 1 capsule (240 mg total) by mouth daily. 01/09/21   [name], MD  folic acid (FOLVITE) 1 MG tablet Take 1 tablet (1 mg total) by mouth daily. 03/09/22   Rice, Evan Orleans, MD  hydrochlorothiazide (HYDRODIURIL) 25 MG tablet Take 1 tablet (25 mg total) by mouth daily. 01/09/21   [name], MD  hydrocortisone 2.5 % cream Apply topically. 05/01/20   [provider]  losartan (COZAAR) 100 MG tablet take 1 tablet by mouth once daily. 07/28/20   [name], MD  meloxicam (MOBIC) 7.5 MG tablet Take by mouth with  food daily or twice daily as needed 03/09/22   Rice, Evan Orleanshristopher W, MD  methotrexate (RHEUMATREX) 2.5 MG tablet Take 8 tablets (20 mg total) by mouth once a week. Caution:Chemotherapy. Protect from light. 03/09/22   Rice, Evan Orleanshristopher W, MD  mometasone (ELOCON) 0.1 % cream Apply topically. 07/21/21   [provider]  Omega-3 Fatty Acids (FISH OIL) 1000 MG CAPS Take 1,000 mg by mouth daily.    [provider]  promethazine-dextromethorphan (PROMETHAZINE-DM) 6.25-15 MG/5ML syrup Take 5 mLs by mouth 3 (three) times daily as needed for cough. Patient not taking: Reported on 03/09/2022 01/08/22    Evan Burns, Sarah E, NP  tacrolimus (PROTOPIC) 0.03 % ointment Apply 1 application topically 2 (two) times daily. 08/07/21   [provider]  triamcinolone cream (KENALOG) 0.1 % Apply topically. 07/21/21   [provider]  VITAMIN D PO Take by mouth daily.    [provider]    Family History Family History  Problem Relation Age of Onset   Hypertension Mother    Diabetes Mother    Kidney disease Mother    Hyperlipidemia Mother    Arthritis Mother    Hypertension Father    Hyperlipidemia Father     Social History Social History   Tobacco Use   Smoking status: Never   Smokeless tobacco: Never  Vaping Use   Vaping Use: Never used  Substance Use Topics   Alcohol use: No   Drug use: No     Allergies   Sulfonamide derivatives, Cephalexin, and Lisinopril   Review of Systems Review of Systems  Neurological:  Positive for headaches.   As per HPI  Physical Exam Triage Vital Signs ED Triage Vitals  Enc Vitals Group     BP 03/11/22 0818 (!) 146/95     Pulse Rate 03/11/22 0818 79     Resp 03/11/22 0818 20     Temp 03/11/22 0818 98.1 F (36.7 C)     Temp src --      SpO2 03/11/22 0818 96 %     Weight --      Height --      Head Circumference --      Peak Flow --      Pain Score 03/11/22 0816 2     Pain Loc --      Pain Edu? --      Excl. in GC? --    No data found.  Updated Vital Signs BP 136/82 (BP Location: Left Arm)   Pulse 79   Temp 98.1 F (36.7 C)   Resp 20   SpO2 96%     Physical Exam Vitals and nursing note reviewed.  Constitutional:      General: He is not in acute distress. HENT:     Right Ear: Tympanic membrane and ear canal normal.     Left Ear: Tympanic membrane and ear canal normal.     Nose: Nose normal.     Mouth/Throat:     Mouth: Mucous membranes are moist.     Pharynx: Oropharynx is clear.  Eyes:     Extraocular Movements: Extraocular movements intact.     Conjunctiva/sclera: Conjunctivae normal.      Pupils: Pupils are equal, round, and reactive to light.  Cardiovascular:     Rate and Rhythm: Normal rate and regular rhythm.     Heart sounds: Normal heart sounds.  Pulmonary:     Effort: Pulmonary effort is normal. No respiratory distress.     Breath  sounds: Normal breath sounds.  Abdominal:     Palpations: Abdomen is soft.     Tenderness: There is no abdominal tenderness.  Musculoskeletal:        General: Normal range of motion.     Cervical back: Normal range of motion.  Neurological:     Mental Status: He is alert and oriented to person, place, and time.     Cranial Nerves: No facial asymmetry.     Sensory: Sensation is intact. No sensory deficit.     Motor: Motor function is intact. No weakness.     Coordination: Coordination is intact. Coordination normal.     Gait: Gait is intact. Gait normal.     UC Treatments / Results  Labs (all labs ordered are listed, but only abnormal results are displayed) Labs Reviewed - No data to display  EKG  Radiology No results found.  Procedures Procedures (including critical care time)  Medications Ordered in UC Medications  ketorolac (TORADOL) injection 30 mg (30 mg Intramuscular Given 03/11/22 0855)    Initial Impression / Assessment and Plan / UC Course  I have reviewed the triage vital signs and the nursing notes.  Pertinent labs & imaging results that were available during my care of the patient were reviewed by me and considered in my medical decision making (see chart for details).   BP in clinic today reassuring. Physical exam unremarkable. Headache is 2/10 and completely resolved after Toradol dose. Discussed with patient his mothers BP cuff may not have been calibrated, could have been wrong cuff size, pressure elevation may have been due to pain, etc. He will get his own cuff to check BP at home. Discussed use of ibuprofen or aleve if headache returns; understands to return to urgent care or emergency department if  medicine does not control headache. He will continue his BP meds and will follow up with primary care. Return precautions discussed. Patient agrees to plan and is discharged in stable condition.  Final Clinical Impressions(s) / UC Diagnoses   Final diagnoses:  Acute nonintractable headache, unspecified headache type     Discharge Instructions      Please follow up with your primary care provider.  Please return to the urgent care or emergency department if symptoms worsen or do not improve.    ED Prescriptions   None    PDMP not reviewed this encounter.   Kastin Cerda, Lurena Joiner, [place] 03/11/22 [phone]

## 2022-03-11 NOTE — Discharge Instructions (Addendum)
Please follow up with your primary care provider.   Please return to the urgent care or emergency department if symptoms worsen or do not improve. 

## 2022-03-11 NOTE — ED Triage Notes (Signed)
Patient presents to Urgent Care with complaints of headache and hypertension systolic's 170s  last night and this morning since last night. Patient reports aleves for pain with no improvement

## 2022-03-12 NOTE — Progress Notes (Signed)
Meloxicam contains sulfa but not the sulfonamide form that usually causes the allergies in antibiotics. I do not think it is a reason he could not take it. He can keep an eye out for any signs of allergy such as rash or swelling that could occur with any new drug.

## 2022-03-15 ENCOUNTER — Ambulatory Visit: Payer: Self-pay | Admitting: Allergy

## 2022-03-15 NOTE — Progress Notes (Deleted)
New Patient Note  RE: Evan Burns MRN: [id number] DOB: 1981/09/11 Date of Office Visit: 03/15/2022  Consult requested by: [name], MD Primary care provider: [name], MD  Chief Complaint: No chief complaint on file.  History of Present Illness: I had the pleasure of seeing Evan Burns for initial evaluation at the [facility] on 03/15/2022. He is a 41 y.o. male, who is referred here by [name], MD for the evaluation of asthma and allergies.  He reports symptoms of *** chest tightness, shortness of breath, coughing, wheezing, nocturnal awakenings for *** years. Current medications include *** which help. He reports *** using aerochamber with inhalers. He tried the following inhalers: ***. Main triggers are ***allergies, infections, weather changes, smoke, exercise, pet exposure. In the last month, frequency of symptoms: ***x/week. Frequency of nocturnal symptoms: ***x/month. Frequency of SABA use: ***x/week. Interference with physical activity: ***. Sleep is ***disturbed. In the last 12 months, emergency room visits/urgent care visits/doctor office visits or hospitalizations due to respiratory issues: ***. In the last 12 months, oral steroids courses: ***. Lifetime history of hospitalization for respiratory issues: ***. Prior intubations: ***. Asthma was diagnosed at age *** by ***. History of pneumonia: ***. He was evaluated by allergist ***pulmonologist in the past. Smoking exposure: ***. Up to date with flu vaccine: ***. Up to date with pneumonia vaccine: ***. Up to date with COVID-19 vaccine: ***. Prior Covid-19 infection: ***. History of reflux: ***.  12/29/2021 PCP visit: "He started having a cough about 10 days ago.  Sometimes he coughs so much it causes a headache or will vomit.     Started Nexium b/c of the cough - denies reflux prior to starting the medication, but no is a possibility- no change after 14 days.  He stopped taking the  medication.   He uses the Symbicort twice daily.  For the past week has used the albuterol twice a week.   It helps temporally but cough comes back.   He now has a dog in his house - the dog has been there the past year - yorkie.  He denies any obvious allergies when the dog first came into the house."  Assessment and Plan: [name] is a 41 y.o. male with: No problem-specific Assessment & Plan notes found for this encounter.  No follow-ups on file.  No orders of the defined types were placed in this encounter.  Lab Orders  No laboratory test(s) ordered today    Other allergy screening: Asthma: {Blank single:19197::"yes","no"} Rhino conjunctivitis: {Blank single:19197::"yes","no"} Food allergy: {Blank single:19197::"yes","no"} Medication allergy: {Blank single:19197::"yes","no"} Hymenoptera allergy: {Blank single:19197::"yes","no"} Urticaria: {Blank single:19197::"yes","no"} Eczema:{Blank single:19197::"yes","no"} History of recurrent infections suggestive of immunodeficency: {Blank single:19197::"yes","no"}  Diagnostics: Spirometry:  Tracings reviewed. His effort: {Blank single:19197::"Good reproducible efforts.","It was hard to get consistent efforts and there is a question as to whether this reflects a maximal maneuver.","Poor effort, data can not be interpreted."} FVC: ***L FEV1: ***L, ***% predicted FEV1/FVC ratio: ***% Interpretation: {Blank single:19197::"Spirometry consistent with mild obstructive disease","Spirometry consistent with moderate obstructive disease","Spirometry consistent with severe obstructive disease","Spirometry consistent with possible restrictive disease","Spirometry consistent with mixed obstructive and restrictive disease","Spirometry uninterpretable due to technique","Spirometry consistent with normal pattern","No overt abnormalities noted given today's efforts"}.  Please see scanned spirometry results for details.  Skin Testing: {Blank  single:19197::"Select foods","Environmental allergy panel","Environmental allergy panel and select foods","Food allergy panel","None","Deferred due to recent antihistamines use"}. *** Results discussed with patient/family.   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   Arthritis of right hip 09/16/2020   Tinea cruris 07/28/2020   Leg pain, bilateral 07/28/2020   Prediabetes 03/19/2020   Chest mass 02/14/2019   Other chest pain 02/14/2019   Superficial phlebitis, chest wall 02/14/2019   Fatigue 11/22/2018   Cough 11/08/2015   Essential hypertension 10/12/2010   Dermatomyositis ([place]) 06/17/2008   Asthma 02/22/2007   Past Medical History:  Diagnosis Date   Arthritis of right hip 09/16/2020   X-ray October 2021   Asthma    Dermatomyositis ([place])    Hypertension    Past Surgical History: Past Surgical History:  Procedure Laterality Date   HAND SURGERY     INCISION AND DRAINAGE OF WOUND     MASS EXCISION Left 07/27/2019   Procedure: LEFT LONG FINGER NODULE EXCISION;  Surgeon: Leandrew Koyanagi, MD;  Location: [facility];  Service: Orthopedics;  Laterality: Left;   TYMPANOSTOMY TUBE PLACEMENT     Medication List:  Current Outpatient Medications  Medication Sig Dispense Refill   albuterol (VENTOLIN HFA) 108 (90 Base) MCG/ACT inhaler Inhale 2 puffs into the lungs every 6 (six) hours as needed for wheezing or shortness of breath. 18 g 11   budesonide-formoterol (SYMBICORT) 160-4.5 MCG/ACT inhaler Inhale 2 puffs into the lungs 2 (two) times daily. 1 each 8   Butenafine HCl 1 % cream Apply twice daily to affected area 30 g 0   COD LIVER OIL PO Take 1 capsule by mouth daily.      Creatine POWD Take 1 Scoop by mouth.     diltiazem (CARDIZEM LA) 240 MG 24 hr tablet SMARTSIG:1 Tablet(s) By Mouth     diltiazem (DILACOR XR) 240 MG 24 hr capsule TAKE 1 CAPSULE BY MOUTH DAILY 30 capsule 5   folic acid (FOLVITE) 1 MG tablet Take 1 tablet (1 mg total) by mouth daily. 90 tablet 1   hydrochlorothiazide (HYDRODIURIL) 25 MG tablet Take 1 tablet (25 mg total) by mouth daily. 90 tablet 3   hydrocortisone 2.5 % cream Apply topically.     losartan (COZAAR) 100 MG tablet take 1 tablet by mouth once daily. 90 tablet 1   meloxicam (MOBIC) 7.5 MG tablet Take by mouth with food daily or twice daily as needed 60 tablet 2   methotrexate (RHEUMATREX) 2.5 MG tablet Take 8 tablets (20 mg total) by mouth once a week. Caution:Chemotherapy. Protect from light. 40 tablet 2   mometasone (ELOCON) 0.1 % cream Apply topically.     Omega-3 Fatty Acids (FISH OIL) 1000 MG CAPS Take 1,000 mg by mouth daily.     promethazine-dextromethorphan (PROMETHAZINE-DM) 6.25-15 MG/5ML syrup Take 5 mLs by mouth 3 (three) times daily as needed for cough. (Patient not taking: Reported on 03/09/2022) 180 mL 0   tacrolimus (PROTOPIC) 0.03 % ointment Apply 1 application topically 2 (two) times daily.     triamcinolone cream (KENALOG) 0.1 % Apply topically.     VITAMIN D PO Take by mouth daily.     No current facility-administered medications for this visit.   Allergies: Allergies  Allergen Reactions   Sulfonamide Derivatives     REACTION: SWELLING (in childhood)   Cephalexin  REACTION: vomiting  No rash or fever   Lisinopril     REACTION: cough   Social History: Social History   Socioeconomic History   Marital status: Married    Spouse name: Not on file   Number of children: Not on file   Years of education: Not on file   Highest education level: Not on file  Occupational History   Not on file  Tobacco Use   Smoking status: Never   Smokeless tobacco: Never  Vaping Use   Vaping Use: Never used   Substance and Sexual Activity   Alcohol use: No   Drug use: No   Sexual activity: Not on file  Other Topics Concern   Not on file  Social History Narrative   Pt lives at home w/ parents       Very active at work - works two jobs   Social Determinants of Radio broadcast assistant Strain: Not on Art therapist Insecurity: Not on file  Transportation Needs: Not on file  Physical Activity: Not on file  Stress: Not on file  Social Connections: Not on file   Lives in a ***. Smoking: *** Occupation: ***  Environmental HistoryFreight forwarder in the house: Estate agent in the family room: {Blank single:19197::"yes","no"} Carpet in the bedroom: {Blank single:19197::"yes","no"} Heating: {Blank single:19197::"electric","gas","heat pump"} Cooling: {Blank single:19197::"central","window","heat pump"} Pet: {Blank single:19197::"yes ***","no"}  Family History: Family History  Problem Relation Age of Onset   Hypertension Mother    Diabetes Mother    Kidney disease Mother    Hyperlipidemia Mother    Arthritis Mother    Hypertension Father    Hyperlipidemia Father    Problem                               Relation Asthma                                   *** Eczema                                *** Food allergy                          *** Allergic rhino conjunctivitis     ***  Review of Systems  Constitutional:  Negative for appetite change, chills, fever and unexpected weight change.  HENT:  Negative for congestion and rhinorrhea.   Eyes:  Negative for itching.  Respiratory:  Negative for cough, chest tightness, shortness of breath and wheezing.   Cardiovascular:  Negative for chest pain.  Gastrointestinal:  Negative for abdominal pain.  Genitourinary:  Negative for difficulty urinating.  Skin:  Negative for rash.  Neurological:  Negative for headaches.   Objective: There were no vitals taken for this visit. There is no height or weight on  file to calculate BMI. Physical Exam Vitals and nursing note reviewed.  Constitutional:      Appearance: Normal appearance. He is well-developed.  HENT:     Head: Normocephalic and atraumatic.     Right Ear: Tympanic membrane and external ear normal.     Left Ear: Tympanic membrane and external ear normal.     Nose: Nose normal.     Mouth/Throat:     Mouth: Mucous membranes  are moist.     Pharynx: Oropharynx is clear.  Eyes:     Conjunctiva/sclera: Conjunctivae normal.  Cardiovascular:     Rate and Rhythm: Normal rate and regular rhythm.     Heart sounds: Normal heart sounds. No murmur heard.   No friction rub. No gallop.  Pulmonary:     Effort: Pulmonary effort is normal.     Breath sounds: Normal breath sounds. No wheezing, rhonchi or rales.  Musculoskeletal:     Cervical back: Neck supple.  Skin:    General: Skin is warm.     Findings: No rash.  Neurological:     Mental Status: He is alert and oriented to person, place, and time.  Psychiatric:        Behavior: Behavior normal.   The plan was reviewed with the patient/family, and all questions/concerned were addressed.  It was my pleasure to see Shiheem today and participate in his care. Please feel free to contact me with any questions or concerns.  Sincerely,  Rexene Alberts, DO Allergy & Immunology  [facility] office: [place] office: [phone]

## 2022-03-19 ENCOUNTER — Ambulatory Visit: Payer: Self-pay | Admitting: Allergy

## 2022-03-20 ENCOUNTER — Encounter: Payer: Self-pay | Admitting: Internal Medicine

## 2022-03-20 NOTE — Patient Instructions (Addendum)
     Your A1c was checked   Medications changes include :   increase diltiazem to 300 mg daily     Return in about 6 months (around 09/23/2022) for follow up.

## 2022-03-20 NOTE — Progress Notes (Unsigned)
Subjective:    Patient ID: Evan Burns, male    DOB: 1981-06-29, 41 y.o.   MRN: [id number]     HPI Evan Burns is here for follow up of his chronic medical problems, including htn, prediabetes, dermatomyositits, asthma  He is exercising regularly.   BP has been elevated at times.     Medications and allergies reviewed with patient and updated if appropriate.  Current Outpatient Medications on File Prior to Visit  Medication Sig Dispense Refill   albuterol (VENTOLIN HFA) 108 (90 Base) MCG/ACT inhaler Inhale 2 puffs into the lungs every 6 (six) hours as needed for wheezing or shortness of breath. 18 g 11   budesonide-formoterol (SYMBICORT) 160-4.5 MCG/ACT inhaler Inhale 2 puffs into the lungs 2 (two) times daily. 1 each 8   Butenafine HCl 1 % cream Apply twice daily to affected area 30 g 0   COD LIVER OIL PO Take 1 capsule by mouth daily.     Creatine POWD Take 1 Scoop by mouth.     diltiazem (DILACOR XR) 240 MG 24 hr capsule TAKE 1 CAPSULE BY MOUTH DAILY 30 capsule 5   folic acid (FOLVITE) 1 MG tablet Take 1 tablet (1 mg total) by mouth daily. 90 tablet 1   hydrochlorothiazide (HYDRODIURIL) 25 MG tablet Take 1 tablet (25 mg total) by mouth daily. 90 tablet 3   hydrocortisone 2.5 % cream Apply topically.     losartan (COZAAR) 100 MG tablet take 1 tablet by mouth once daily. 90 tablet 1   meloxicam (MOBIC) 7.5 MG tablet Take by mouth with food daily or twice daily as needed 60 tablet 2   methotrexate (RHEUMATREX) 2.5 MG tablet Take 8 tablets (20 mg total) by mouth once a week. Caution:Chemotherapy. Protect from light. 40 tablet 2   mometasone (ELOCON) 0.1 % cream Apply topically.     Omega-3 Fatty Acids (FISH OIL) 1000 MG CAPS Take 1,000 mg by mouth daily.     tacrolimus (PROTOPIC) 0.03 % ointment Apply 1 application topically 2 (two) times daily.     triamcinolone cream (KENALOG) 0.1 % Apply topically.     VITAMIN D PO Take by mouth daily.     No current  facility-administered medications on file prior to visit.     Review of Systems  Constitutional:  Negative for fever.  Respiratory:  Positive for cough (allergies - improving). Negative for shortness of breath and wheezing.   Cardiovascular:  Negative for chest pain, palpitations and leg swelling.  Neurological:  Positive for headaches. Negative for dizziness and light-headedness.      Objective:   Vitals:   03/23/22 0759  BP: 140/90  Pulse: 83  Temp: 98.4 F (36.9 C)  SpO2: 97%   BP Readings from Last 3 Encounters:  03/23/22 140/90  03/11/22 136/82  03/09/22 (!) 143/75   Wt Readings from Last 3 Encounters:  03/23/22 262 lb (118.8 kg)  03/09/22 260 lb (117.9 kg)  12/29/21 261 lb (118.4 kg)   Body mass index is 41.04 kg/m.    Physical Exam Constitutional:      General: He is not in acute distress.    Appearance: Normal appearance. He is not ill-appearing.  HENT:     Head: Normocephalic and atraumatic.  Eyes:     Conjunctiva/sclera: Conjunctivae normal.  Cardiovascular:     Rate and Rhythm: Normal rate and regular rhythm.     Heart sounds: Normal heart sounds. No murmur heard. Pulmonary:  Effort: Pulmonary effort is normal. No respiratory distress.     Breath sounds: Normal breath sounds. No wheezing or rales.  Musculoskeletal:     Right lower leg: No edema.     Left lower leg: No edema.  Skin:    General: Skin is warm and dry.     Findings: No rash.  Neurological:     Mental Status: He is alert. Mental status is at baseline.  Psychiatric:        Mood and Affect: Mood normal.       Lab Results  Component Value Date   WBC 5.2 03/09/2022   HGB 14.3 03/09/2022   HCT 44.3 03/09/2022   PLT 239 03/09/2022   GLUCOSE 86 03/09/2022   CHOL 121 03/20/2021   TRIG 74.0 03/20/2021   HDL 35.90 (L) 03/20/2021   LDLCALC 70 03/20/2021   ALT 35 03/09/2022   AST 24 03/09/2022   NA 142 03/09/2022   K 4.4 03/09/2022   CL 106 03/09/2022   CREATININE 1.18  03/09/2022   BUN 15 03/09/2022   CO2 28 03/09/2022   TSH 0.95 03/18/2020   HGBA1C 5.9 09/21/2021    Lab Results  Component Value Date   HGBA1C 5.5 03/23/2022   HGBA1C 5.5 03/23/2022   HGBA1C 5.5 (A) 03/23/2022   HGBA1C 5.5 03/23/2022     Assessment & Plan:    See Problem List for Assessment and Plan of chronic medical problems.

## 2022-03-23 ENCOUNTER — Ambulatory Visit: Payer: Federal, State, Local not specified - PPO | Admitting: Internal Medicine

## 2022-03-23 VITALS — BP 140/90 | HR 83 | Temp 98.4°F | Ht 67.0 in | Wt 262.0 lb

## 2022-03-23 DIAGNOSIS — M3390 Dermatopolymyositis, unspecified, organ involvement unspecified: Secondary | ICD-10-CM

## 2022-03-23 DIAGNOSIS — I10 Essential (primary) hypertension: Secondary | ICD-10-CM | POA: Diagnosis not present

## 2022-03-23 DIAGNOSIS — R7303 Prediabetes: Secondary | ICD-10-CM | POA: Diagnosis not present

## 2022-03-23 DIAGNOSIS — J4520 Mild intermittent asthma, uncomplicated: Secondary | ICD-10-CM

## 2022-03-23 LAB — POCT GLYCOSYLATED HEMOGLOBIN (HGB A1C)
HbA1c POC (<> result, manual entry): 5.5 % (ref 4.0–5.6)
HbA1c, POC (controlled diabetic range): 5.5 % (ref 0.0–7.0)
HbA1c, POC (prediabetic range): 5.5 % — AB (ref 5.7–6.4)
Hemoglobin A1C: 5.5 % (ref 4.0–5.6)

## 2022-03-23 MED ORDER — DILTIAZEM HCL ER COATED BEADS 300 MG PO CP24: 300.0000 mg | ORAL_CAPSULE | Freq: Every day | ORAL | 1 refills | Status: DC

## 2022-03-23 NOTE — Assessment & Plan Note (Signed)
Chronic Following with Dr. Benjamine Mola On methotrexate

## 2022-03-23 NOTE — Assessment & Plan Note (Signed)
Chronic Mild, persistent Controlled Continue Symbicort 160-4.5 mcg per ACT 2 puffs twice daily, continue albuterol inhaler as needed

## 2022-03-23 NOTE — Assessment & Plan Note (Addendum)
Chronic Blood pressure not well controlled Continue HCTZ 25 mg daily, losartan 100 mg daily, increase diltiazem to 300 mg daily

## 2022-03-23 NOTE — Assessment & Plan Note (Signed)
Chronic ?Strong family history of diabetes ?Check a1c ?Low sugar / carb diet ?Stressed regular exercise ?

## 2022-04-15 ENCOUNTER — Encounter: Payer: Self-pay | Admitting: Internal Medicine

## 2022-04-15 NOTE — Telephone Encounter (Signed)
Mostly needs it for his missed days to attend clinic appointments, I'm okay to fill out if he brings this.

## 2022-04-16 IMAGING — DX DG HIP (WITH OR WITHOUT PELVIS) 2-3V*R*
3 series · 3 of 3 positions shown · non-contrast
Comparison: None.

CLINICAL DATA: Acute right hip pain without known injury.

EXAM:
DG HIP (WITH OR WITHOUT PELVIS) 2-3V RIGHT

[pelvis ap]
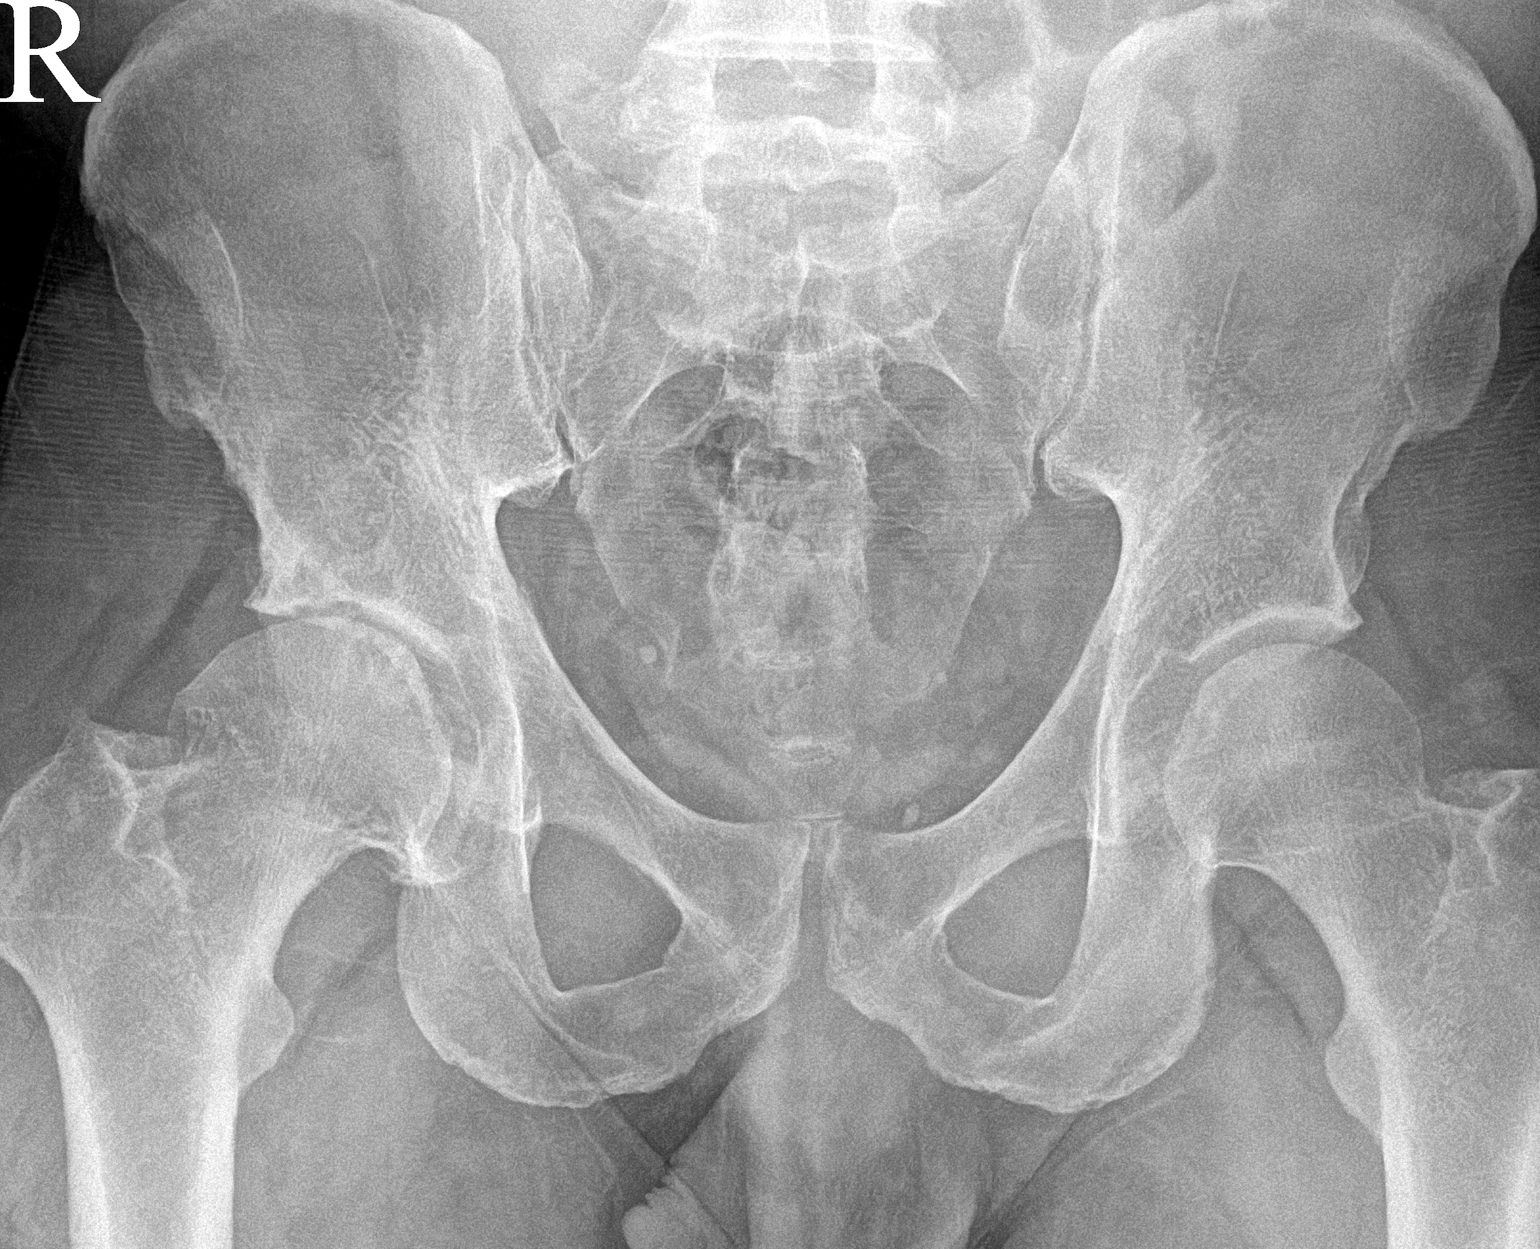

[hip ap]
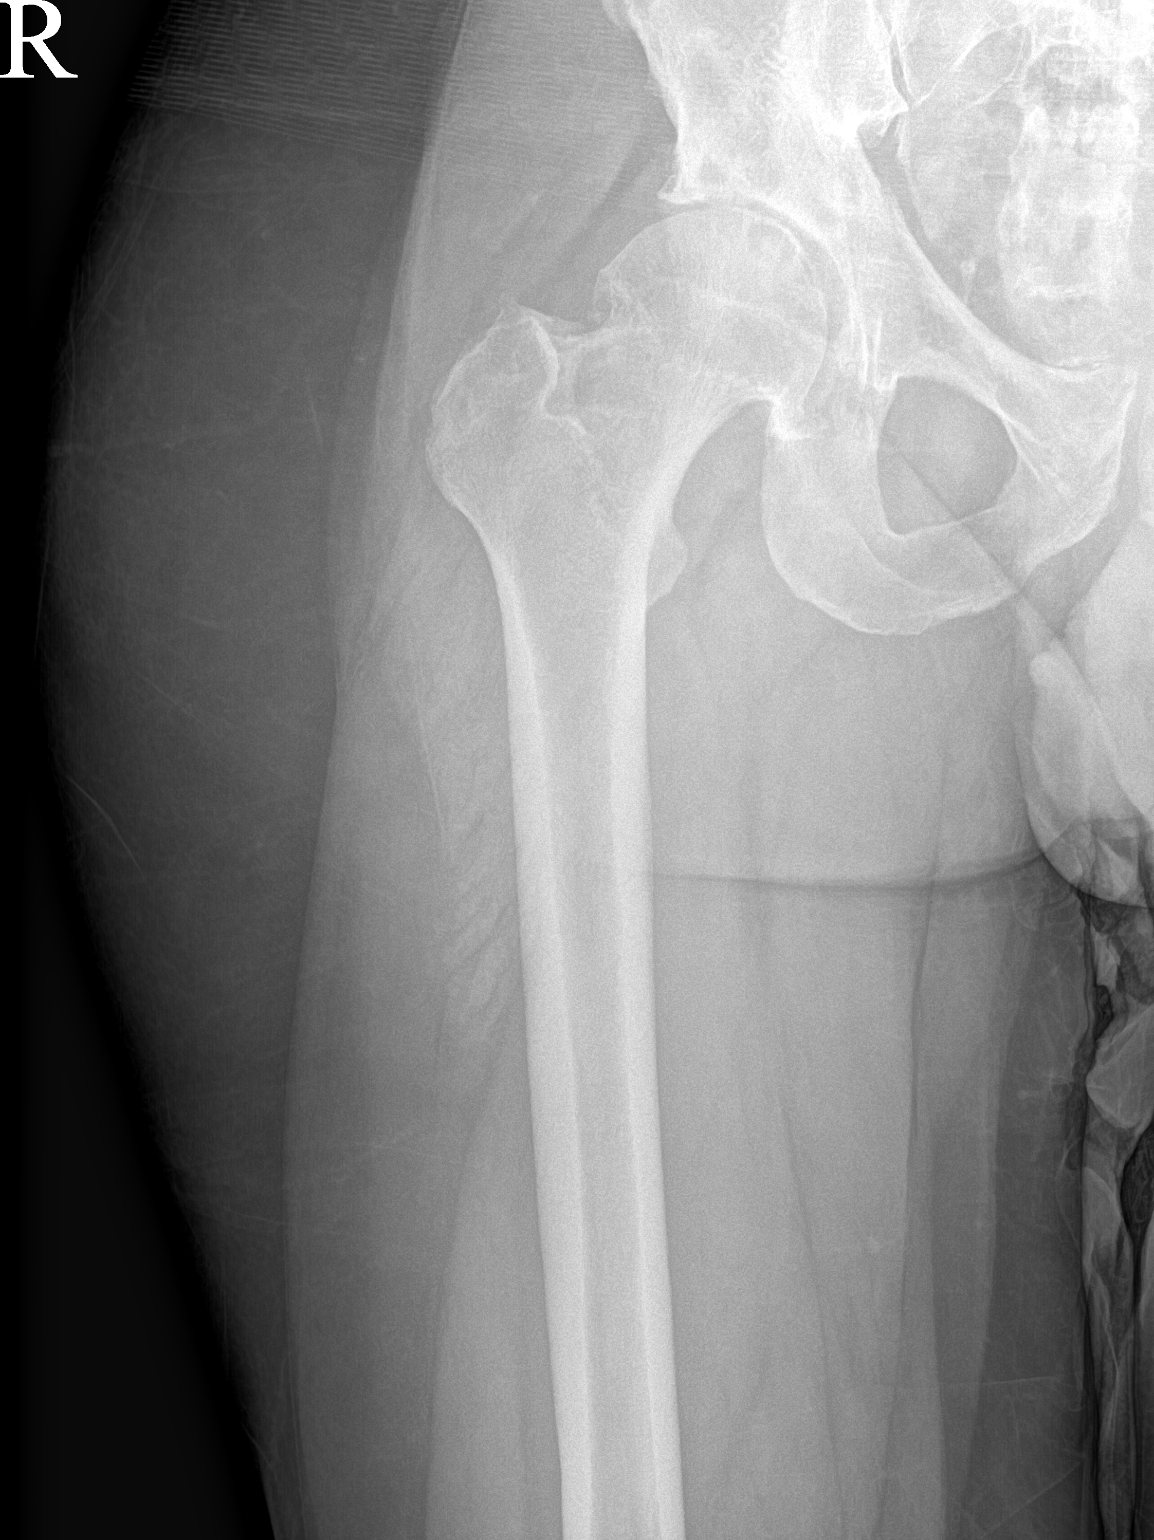

[hip frog leg]
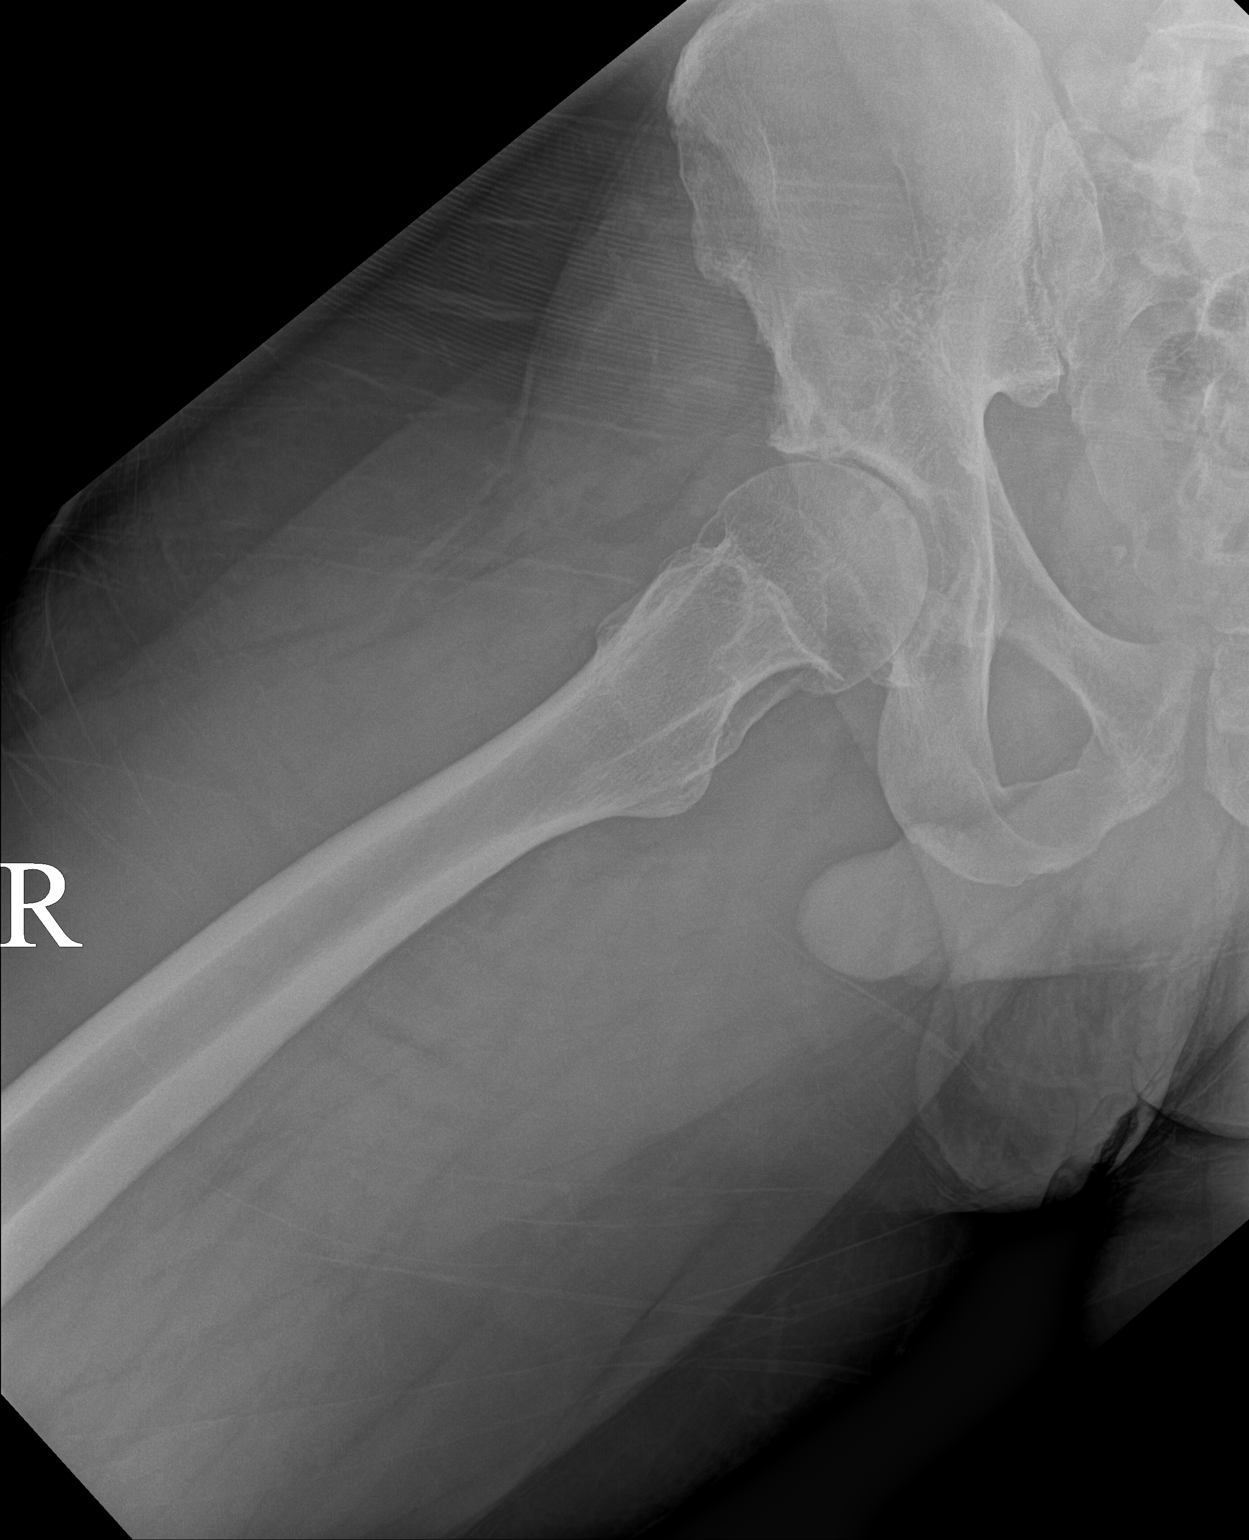

[3 of 3 positions shown; findings below may reference images not displayed]

FINDINGS: There is no evidence of hip fracture or dislocation. Moderate
narrowing and osteophyte formation of the right hip is noted.
IMPRESSION: Moderate osteoarthritis of the right hip. No acute abnormality is
noted.

## 2022-04-22 ENCOUNTER — Other Ambulatory Visit: Payer: Self-pay | Admitting: Internal Medicine

## 2022-04-22 DIAGNOSIS — I10 Essential (primary) hypertension: Secondary | ICD-10-CM

## 2022-05-06 DIAGNOSIS — H02403 Unspecified ptosis of bilateral eyelids: Secondary | ICD-10-CM | POA: Diagnosis not present

## 2022-05-06 DIAGNOSIS — H5213 Myopia, bilateral: Secondary | ICD-10-CM | POA: Diagnosis not present

## 2022-06-02 NOTE — Progress Notes (Addendum)
Office Visit Note  Patient: Evan Burns             Date of Birth: 02-16-1981           MRN: [id number]             PCP: Binnie Rail, MD Referring: Binnie Rail, MD Visit Date: 06/14/2022   Subjective:  Follow-up (Hip pain, right knee pain, left foot swelling. )   History of Present Illness: Evan Burns is a 41 y.o. male here for follow up or dermatomyositis with chronic calcinosis cutis currently on methotrexate 20 mg p.o. weekly folic acid 1 mg daily and is also on meloxicam 15 mg p.o. daily for right hip and knee joint pain.  Since starting the meloxicam he did notice improvement with his right hip pain actually feels like this was doing better and having more trouble of the knee than in the hip.  More recently had an episode where he he developed some left ankle pain and swelling but this resolved within a few weeks and not having a problem at the moment.  He had 1 episode of calcified nodule in the right elbow broke open wound was draining for about 3 days but this healed without any specific intervention.  Apparently his wife has noticed some increasing changes on the lateral side of his left foot but he does not notice any symptoms there.  Otherwise skin rash and nodules are about the same.  Previous HPI 03/09/2022 Evan Burns is a 41 y.o. male here for follow up for dermatomyositis with chronic calcinosis cutis on methotrexate 15 mg PO weekly and folic acid 1 mg daily. He is tolerating methotrexate without any major problems. He has developed increased skin peeling and itching on his face and neck. He has persistent right hip and knee pain and has some stiffness worst first thing in the morning. He notices upper leg weakness about 2 days out of the week.   Previous HPI 11/18/2021 Evan Burns is a 41 y.o. male here for follow up for dermatomyositis with chronic calcinosis cutis on methotrexate 15 mg PO weekly and folic acid 1 mg daily. He continues to have right  hip pain also having right knee pain. Notices some fatigue when taking the methotrexate, no other intolerance or side effect notices. He saw Dr. Tempie Donning for treatment of right elbow abscess that has healed well no ongoing pain or inflammation and off antibiotics. No other infections or major events.   Previous HPI 08/24/21 Evan Burns is a 41 y.o. male here for follow up for dermatomyositis with chronic calcinosis cutis on methotrexate 15 mg PO weekly since about 3 months ago. He has not noticed any difference since our last visit. On further discission he was apparently taking only 1 tablet weekly of the methotrexate so far below recommended dose. He had some new skin peeling over his face increased since this summer and saw dermatology for this multiple times. They tried a few topical medication without response but now using tacrolimus with improvement.   Previous HPI 10/02/2020 History of Present Illness: Evan Burns is a 41 y.o. male with a history of dermatomyositis here for evaluation of myalgias and skin problems. He has a long history of DM diagnosed around 20 years ago originally with muscle and skin disease activity. He was treated with steroids for this with apparent resolution of his muscle inflammation. Since that time he has had continued development of calcifications that  sometimes become swollen or infected but no known recurrence of muscular involvement. He takes treatments for asthma but no known ILD. Today he complains of joint pain in multiple sites the right groin, left ankle, and right shoulder. He has had previous imaging of the shoulder and hip showing right shoulder bursitis and moderately advanced OA of the right hip. He has not had imaging for the left ankle pain. He does see swelling around the left ankle sometimes and worse after working all day. He has not noticed any focal weakness except around his right hip and thigh. He denies cough, dyspnea, fevers, weight loss,  or lymphadenopathy.     Review of Systems  Constitutional:  Positive for fatigue.  HENT:  Positive for mouth dryness. Negative for mouth sores.   Eyes:  Negative for dryness.  Respiratory:  Negative for shortness of breath.   Cardiovascular:  Negative for chest pain and palpitations.  Gastrointestinal:  Negative for blood in stool, constipation and diarrhea.  Endocrine: Negative for increased urination.  Genitourinary:  Negative for involuntary urination.  Musculoskeletal:  Positive for joint pain, gait problem, joint pain, joint swelling, myalgias, muscle weakness, morning stiffness, muscle tenderness and myalgias.  Skin:  Negative for color change, rash, hair loss and sensitivity to sunlight.  Allergic/Immunologic: Negative for susceptible to infections.  Neurological:  Positive for headaches. Negative for dizziness.  Hematological:  Negative for swollen glands.  Psychiatric/Behavioral:  Negative for depressed mood and sleep disturbance. The patient is not nervous/anxious.     PMFS History:  Patient Active Problem List   Diagnosis Date Noted   Facial rash 03/09/2022   COVID-19 01/08/2022   Abscess of left elbow 10/01/2021   High risk medication use 06/01/2021   Vitamin D deficiency 03/19/2021   Lower leg edema 01/09/2021   Pain in left ankle and joints of left foot 10/02/2020   Calcinosis cutis 10/02/2020   Arthritis of right hip 09/16/2020   Tinea cruris 07/28/2020   Leg pain, bilateral 07/28/2020   Prediabetes 03/19/2020   Chest mass 02/14/2019   Other chest pain 02/14/2019   Superficial phlebitis, chest wall 02/14/2019   Fatigue 11/22/2018   Cough 11/08/2015   Essential hypertension 10/12/2010   Dermatomyositis ([place]) 06/17/2008   Asthma 02/22/2007    Past Medical History:  Diagnosis Date   Arthritis of right hip 09/16/2020   X-ray October 2021   Asthma    Dermatomyositis ([place])    Hypertension     Family History  Problem Relation Age of Onset   Hypertension  Mother    Diabetes Mother    Kidney disease Mother    Hyperlipidemia Mother    Arthritis Mother    Hypertension Father    Hyperlipidemia Father    Past Surgical History:  Procedure Laterality Date   HAND SURGERY     INCISION AND DRAINAGE OF WOUND     MASS EXCISION Left 07/27/2019   Procedure: LEFT LONG FINGER NODULE EXCISION;  Surgeon: Leandrew Koyanagi, MD;  Location: [facility];  Service: Orthopedics;  Laterality: Left;   TYMPANOSTOMY TUBE PLACEMENT     Social History   Social History Narrative   Pt lives at home w/ parents       Very active at work - works two jobs   Immunization History  Administered Date(s) Administered   Influenza Split 08/25/2011, 07/25/2012   Influenza Whole 08/08/2007, 08/14/2008, 07/23/2009, 07/21/2010   Influenza,inj,Quad PF,6+ Mos 07/24/2013, 07/23/2014, 07/01/2015, 07/05/2016, 08/06/2017, 08/04/2018, 09/07/2019, 07/28/2020, 07/03/2021  PFIZER(Purple Top)SARS-COV-2 Vaccination 01/07/2020, 01/28/2020, 09/22/2020   Pfizer Covid-19 Vaccine Bivalent Booster 14yr & up 08/04/2021   Pneumococcal Conjugate-13 09/21/2021   Pneumococcal Polysaccharide-23 10/01/2014   Tdap 09/13/2013, 04/18/2019     Objective: Vital Signs: BP 135/87 (BP Location: Left Arm, Patient Position: Sitting, Cuff Size: Large)   Pulse 83   Resp 13   Ht 5' 7" (1.702 m)   Wt 264 lb (119.7 kg)   BMI 41.35 kg/m    Physical Exam Cardiovascular:     Rate and Rhythm: Normal rate and regular rhythm.  Pulmonary:     Effort: Pulmonary effort is normal.     Breath sounds: Normal breath sounds.  Musculoskeletal:     Right lower leg: No edema.     Left lower leg: No edema.  Skin:    General: Skin is warm and dry.     Findings: Rash present.     Comments: Extensive hyperpigmented rash on face and bilateral arms, numerous large firm subcutaneous skin nodules on elbows forearms dorsal aspect of hands bilaterally  Neurological:     Mental Status: He is alert.   Psychiatric:        Mood and Affect: Mood normal.      Musculoskeletal Exam:  Shoulders full ROM no tenderness or swelling Elbows full ROM no tenderness or swelling Wrists full ROM no tenderness or swelling Fingers full ROM no tenderness or swelling Right hip with significantly decreased internal rotation compared to left side, no tenderness to palpation on lateral hips Knees full ROM no tenderness or swelling Ankles full ROM no tenderness or swelling, there is some slight crepitus or catching in the left ankle with inversion eversion range of motion  Investigation: No additional findings.  Imaging: No results found.  Recent Labs: Lab Results  Component Value Date   WBC 5.2 03/09/2022   HGB 14.3 03/09/2022   PLT 239 03/09/2022   NA 142 03/09/2022   K 4.4 03/09/2022   CL 106 03/09/2022   CO2 28 03/09/2022   GLUCOSE 86 03/09/2022   BUN 15 03/09/2022   CREATININE 1.18 03/09/2022   BILITOT 0.4 03/09/2022   ALKPHOS 45 09/21/2021   AST 24 03/09/2022   ALT 35 03/09/2022   PROT 6.6 03/09/2022   ALBUMIN 4.4 09/21/2021   CALCIUM 9.1 03/09/2022   GFRAA  02/01/2010    >60        The eGFR has been calculated using the MDRD equation. This calculation has not been validated in all clinical situations. eGFR's persistently <60 mL/min signify possible Chronic Kidney Disease.    Speciality Comments: No specialty comments available.  Procedures:  No procedures performed Allergies: Sulfonamide derivatives, Cephalexin, and Lisinopril   Assessment / Plan:     Visit Diagnoses: Dermatomyositis (HBent - Plan: CK  So far have not seen very obvious improvement in cutaneous findings with methotrexate treatment now he is on a higher dose.  Plan to recheck the CK for any serologic evidence of response.  I think if this is not trending downward would recommend discontinuing the methotrexate and possibly just monitoring for now or getting additional dermatology recommendation.  Pending  results reviewed we will continue with methotrexate 20 mg p.o. weekly folic acid 1 mg daily.  High risk medication use - methotrexate (RHEUMATREX) 2.5 MG tablet Take 8 tablets (20 mg total) by mouth once a week.meloxicam (MOBIC) 7.5 MG tablet take by mouth with food daily. - Plan: CBC with Differential/Platelet, COMPLETE METABOLIC PANEL WITH GFR  Checking CBC  and CMP for methotrexate toxicity monitoring.  He is possibly experiencing some GI side effect with urgency but no watery loose stools no pain or nausea.  Arthritis of right hip  He is noticing symptom improvement with the meloxicam.  Definitely no evidence of sulfa related medication intolerance.  Recommend he can continue the 7.5 mg tablet 1-2 times daily as needed.   Orders: Orders Placed This Encounter  Procedures   CBC with Differential/Platelet   COMPLETE METABOLIC PANEL WITH GFR   CK   No orders of the defined types were placed in this encounter.    Follow-Up Instructions: Return in about 3 months (around 09/14/2022) for DM on MTX f/u 6mo.   CCollier Salina MD  Note - This record has been created using [employer]  Chart creation errors have been sought, but may not always  have been located. Such creation errors do not reflect on  the standard of medical care.

## 2022-06-14 ENCOUNTER — Ambulatory Visit: Payer: Federal, State, Local not specified - PPO | Attending: Internal Medicine | Admitting: Internal Medicine

## 2022-06-14 ENCOUNTER — Encounter: Payer: Self-pay | Admitting: Internal Medicine

## 2022-06-14 VITALS — BP 135/87 | HR 83 | Resp 13 | Ht 67.0 in | Wt 264.0 lb

## 2022-06-14 DIAGNOSIS — Z79899 Other long term (current) drug therapy: Secondary | ICD-10-CM

## 2022-06-14 DIAGNOSIS — E559 Vitamin D deficiency, unspecified: Secondary | ICD-10-CM

## 2022-06-14 DIAGNOSIS — M1611 Unilateral primary osteoarthritis, right hip: Secondary | ICD-10-CM

## 2022-06-14 DIAGNOSIS — M3390 Dermatopolymyositis, unspecified, organ involvement unspecified: Secondary | ICD-10-CM | POA: Diagnosis not present

## 2022-06-14 DIAGNOSIS — R21 Rash and other nonspecific skin eruption: Secondary | ICD-10-CM | POA: Diagnosis not present

## 2022-06-15 LAB — COMPLETE METABOLIC PANEL WITH GFR
AG Ratio: 2 (calc) (ref 1.0–2.5)
ALT: 32 U/L (ref 9–46)
AST: 26 U/L (ref 10–40)
Albumin: 4.5 g/dL (ref 3.6–5.1)
Alkaline phosphatase (APISO): 48 U/L (ref 36–130)
BUN: 17 mg/dL (ref 7–25)
CO2: 24 mmol/L (ref 20–32)
Calcium: 9.1 mg/dL (ref 8.6–10.3)
Chloride: 107 mmol/L (ref 98–110)
Creat: 1.19 mg/dL (ref 0.60–1.29)
Globulin: 2.2 g/dL (calc) (ref 1.9–3.7)
Glucose, Bld: 92 mg/dL (ref 65–99)
Potassium: 4 mmol/L (ref 3.5–5.3)
Sodium: 141 mmol/L (ref 135–146)
Total Bilirubin: 0.4 mg/dL (ref 0.2–1.2)
Total Protein: 6.7 g/dL (ref 6.1–8.1)
eGFR: 79 mL/min/{1.73_m2} (ref >=60)

## 2022-06-15 LAB — CBC WITH DIFFERENTIAL/PLATELET
Absolute Monocytes: 291 cells/uL (ref 200–950)
Basophils Absolute: 117 cells/uL (ref 0–200)
Basophils Relative: 2.3 %
Eosinophils Absolute: 347 cells/uL (ref 15–500)
Eosinophils Relative: 6.8 %
HCT: 43.6 % (ref 38.5–50.0)
Hemoglobin: 13.9 g/dL (ref 13.2–17.1)
Lymphs Abs: 2025 cells/uL (ref 850–3900)
MCH: 26.9 pg — ABNORMAL LOW (ref 27.0–33.0)
MCHC: 31.9 g/dL — ABNORMAL LOW (ref 32.0–36.0)
MCV: 84.3 fL (ref 80.0–100.0)
MPV: 9.5 fL (ref 7.5–12.5)
Monocytes Relative: 5.7 %
Neutro Abs: 2321 cells/uL (ref 1500–7800)
Neutrophils Relative %: 45.5 %
Platelets: 234 10*3/uL (ref 140–400)
RBC: 5.17 10*6/uL (ref 4.20–5.80)
RDW: 12 % (ref 11.0–15.0)
Total Lymphocyte: 39.7 %
WBC: 5.1 10*3/uL (ref 3.8–10.8)

## 2022-06-15 LAB — CK: Total CK: 736 U/L — ABNORMAL HIGH (ref 44–196)

## 2022-06-15 NOTE — Progress Notes (Signed)
Lab results look okay but his CK level is somewhat increased again to 736.  Overall I do not think I am seeing a very large benefit to justify continuing the methotrexate with additional side effect risk and lab monitoring requirement.  I think he can stop this medicine we can continue the meloxicam to help with his joint pains. For considering other options I think it might benefit to have him seeing a dermatologist who is experience with autoimmune diseases.  If he is interested we could refer for this.

## 2022-06-25 ENCOUNTER — Encounter: Payer: Self-pay | Admitting: *Deleted

## 2022-07-26 ENCOUNTER — Ambulatory Visit (INDEPENDENT_AMBULATORY_CARE_PROVIDER_SITE_OTHER): Payer: Federal, State, Local not specified - PPO

## 2022-07-26 DIAGNOSIS — Z23 Encounter for immunization: Secondary | ICD-10-CM | POA: Diagnosis not present

## 2022-07-26 NOTE — Progress Notes (Deleted)
Pt responded well to injection. 

## 2022-08-11 ENCOUNTER — Telehealth: Payer: Self-pay

## 2022-08-11 NOTE — Telephone Encounter (Signed)
Patient received the Pfizer Covid vaccine on 10/13.  

## 2022-08-16 NOTE — Telephone Encounter (Signed)
Documented already

## 2022-08-22 NOTE — Progress Notes (Unsigned)
    Subjective:    Patient ID: Evan Burns, male    DOB: 1981/09/22, 41 y.o.   MRN: [id number]      HPI Evan Burns is here for No chief complaint on file.   He is here for an acute visit for cold symptoms.  He has asthma and uses symbicort BID, albuterol prn.    His symptoms started  He is experiencing   He has tried taking      Medications and allergies reviewed with patient and updated if appropriate.  Current Outpatient Medications on File Prior to Visit  Medication Sig Dispense Refill   albuterol (VENTOLIN HFA) 108 (90 Base) MCG/ACT inhaler Inhale 2 puffs into the lungs every 6 (six) hours as needed for wheezing or shortness of breath. 18 g 11   budesonide-formoterol (SYMBICORT) 160-4.5 MCG/ACT inhaler Inhale 2 puffs into the lungs 2 (two) times daily. 1 each 8   Butenafine HCl 1 % cream Apply twice daily to affected area 30 g 0   COD LIVER OIL PO Take 1 capsule by mouth daily.     Creatine POWD Take 1 Scoop by mouth.     diltiazem (CARDIZEM CD) 300 MG 24 hr capsule Take 1 capsule (300 mg total) by mouth daily. 90 capsule 1   folic acid (FOLVITE) 1 MG tablet Take 1 tablet (1 mg total) by mouth daily. 90 tablet 1   hydrochlorothiazide (HYDRODIURIL) 25 MG tablet Take 1 tablet (25 mg total) by mouth daily. 90 tablet 3   hydrocortisone 2.5 % cream Apply topically.     losartan (COZAAR) 100 MG tablet TAKE ONE TABLET BY MOUTH DAILY 90 tablet 1   meloxicam (MOBIC) 7.5 MG tablet Take by mouth with food daily or twice daily as needed 60 tablet 2   methotrexate (RHEUMATREX) 2.5 MG tablet Take 8 tablets (20 mg total) by mouth once a week. Caution:Chemotherapy. Protect from light. 40 tablet 2   mometasone (ELOCON) 0.1 % cream Apply topically.     Omega-3 Fatty Acids (FISH OIL) 1000 MG CAPS Take 1,000 mg by mouth daily.     tacrolimus (PROTOPIC) 0.03 % ointment Apply 1 application topically 2 (two) times daily.     triamcinolone cream (KENALOG) 0.1 % Apply topically.     VITAMIN  D PO Take by mouth daily.     No current facility-administered medications on file prior to visit.    Review of Systems     Objective:  There were no vitals filed for this visit. BP Readings from Last 3 Encounters:  06/14/22 135/87  03/23/22 140/90  03/11/22 136/82   Wt Readings from Last 3 Encounters:  06/14/22 264 lb (119.7 kg)  03/23/22 262 lb (118.8 kg)  03/09/22 260 lb (117.9 kg)   There is no height or weight on file to calculate BMI.    Physical Exam         Assessment & Plan:    See Problem List for Assessment and Plan of chronic medical problems.

## 2022-08-23 ENCOUNTER — Ambulatory Visit: Payer: Federal, State, Local not specified - PPO | Admitting: Internal Medicine

## 2022-08-23 ENCOUNTER — Encounter: Payer: Self-pay | Admitting: Internal Medicine

## 2022-08-23 DIAGNOSIS — I10 Essential (primary) hypertension: Secondary | ICD-10-CM | POA: Diagnosis not present

## 2022-08-23 DIAGNOSIS — J45909 Unspecified asthma, uncomplicated: Secondary | ICD-10-CM | POA: Insufficient documentation

## 2022-08-23 DIAGNOSIS — J4531 Mild persistent asthma with (acute) exacerbation: Secondary | ICD-10-CM | POA: Diagnosis not present

## 2022-08-23 MED ORDER — AMOXICILLIN-POT CLAVULANATE 875-125 MG PO TABS: 1.0000 | ORAL_TABLET | Freq: Two times a day (BID) | ORAL | 0 refills | Status: DC

## 2022-08-23 NOTE — Assessment & Plan Note (Signed)
Acute Likely bacterial With exacerbation of asthma Start Augmentin 875-125 mg BID x 10 day Has cough syrup at home-will let me know if he needs a refill Continue Symbicort inhaler twice daily and albuterol inhaler as needed otc cold medications Advised to contact rheumatology regarding stopping methotrexate and when to restart Rest, fluid Call if no improvement

## 2022-08-23 NOTE — Assessment & Plan Note (Signed)
Chronic Blood pressure adequately controlled-SBP slightly higher than ideal No change today since he is here for a sick visit which may be elevating his blood pressure some Continue diltiazem 300 mg daily, HCTZ 25 mg daily, losartan 100 mg daily

## 2022-08-23 NOTE — Patient Instructions (Addendum)
        Medications changes include :   Augmentin twice a day for 10 days     Return if symptoms worsen or fail to improve.  

## 2022-08-30 ENCOUNTER — Encounter: Payer: Self-pay | Admitting: Internal Medicine

## 2022-08-31 NOTE — Progress Notes (Unsigned)
New Patient Note  RE: Evan Burns MRN: [id number] DOB: 13-Feb-1981 Date of Office Visit: 09/01/2022  Consult requested by: Evan Rail, MD Primary care provider: Binnie Rail, MD  Chief Complaint: No chief complaint on file.  History of Present Illness: I had the pleasure of seeing Evan Burns for initial evaluation at the [facility] on 08/31/2022. He is a 41 y.o. male, who is referred here by Evan Rail, MD for the evaluation of cough.  He reports symptoms of *** chest tightness, shortness of breath, coughing, wheezing, nocturnal awakenings for *** years. Current medications include *** which help. He reports *** using aerochamber with inhalers. He tried the following inhalers: ***. Main triggers are ***allergies, infections, weather changes, smoke, exercise, pet exposure. In the last month, frequency of symptoms: ***x/week. Frequency of nocturnal symptoms: ***x/month. Frequency of SABA use: ***x/week. Interference with physical activity: ***. Sleep is ***disturbed. In the last 12 months, emergency room visits/urgent care visits/doctor office visits or hospitalizations due to respiratory issues: ***. In the last 12 months, oral steroids courses: ***. Lifetime history of hospitalization for respiratory issues: ***. Prior intubations: ***. Asthma was diagnosed at age *** by ***. History of pneumonia: ***. He was evaluated by allergist ***pulmonologist in the past. Smoking exposure: ***. Up to date with flu vaccine: ***. Up to date with pneumonia vaccine: ***. Up to date with COVID-19 vaccine: ***. Prior Covid-19 infection: ***. History of reflux: ***.  12/29/2021 PCP visit: "He started having a cough about 10 days ago.  Sometimes he coughs so much it causes a headache or will vomit.   Started Nexium b/c of the cough - denies reflux prior to starting the medication, but no is a possibility- no change after 14 days.  He stopped taking the medication. He uses the  Symbicort twice daily.  For the past week has used the albuterol twice a week.   It helps temporally but cough comes back. He now has a dog in his house - the dog has been there the past year - yorkie.  He denies any obvious allergies when the dog first came into the house."  Assessment and Plan: Evan Burns is a 41 y.o. male with: No problem-specific Assessment & Plan notes found for this encounter.  No follow-ups on file.  No orders of the defined types were placed in this encounter.  Lab Orders  No laboratory test(s) ordered today    Other allergy screening: Asthma: {Blank single:19197::"yes","no"} Rhino conjunctivitis: {Blank single:19197::"yes","no"} Food allergy: {Blank single:19197::"yes","no"} Medication allergy: {Blank single:19197::"yes","no"} Hymenoptera allergy: {Blank single:19197::"yes","no"} Urticaria: {Blank single:19197::"yes","no"} Eczema:{Blank single:19197::"yes","no"} History of recurrent infections suggestive of immunodeficency: {Blank single:19197::"yes","no"}  Diagnostics: Spirometry:  Tracings reviewed. His effort: {Blank single:19197::"Good reproducible efforts.","It was hard to get consistent efforts and there is a question as to whether this reflects a maximal maneuver.","Poor effort, data can not be interpreted."} FVC: ***L FEV1: ***L, ***% predicted FEV1/FVC ratio: ***% Interpretation: {Blank single:19197::"Spirometry consistent with mild obstructive disease","Spirometry consistent with moderate obstructive disease","Spirometry consistent with severe obstructive disease","Spirometry consistent with possible restrictive disease","Spirometry consistent with mixed obstructive and restrictive disease","Spirometry uninterpretable due to technique","Spirometry consistent with normal pattern","No overt abnormalities noted given today's efforts"}.  Please see scanned spirometry results for details.  Skin Testing: {Blank single:19197::"Select foods","Environmental  allergy panel","Environmental allergy panel and select foods","Food allergy panel","None","Deferred due to recent antihistamines use"}. *** Results discussed with patient/family.   Past Medical History: Patient Active Problem List   Diagnosis Date Noted   Asthmatic bronchitis  08/23/2022   Facial rash 03/09/2022   COVID-19 01/08/2022   Abscess of left elbow 10/01/2021   High risk medication use 06/01/2021   Vitamin D deficiency 03/19/2021   Lower leg edema 01/09/2021   Pain in left ankle and joints of left foot 10/02/2020   Calcinosis cutis 10/02/2020   Arthritis of right hip 09/16/2020   Tinea cruris 07/28/2020   Leg pain, bilateral 07/28/2020   Prediabetes 03/19/2020   Chest mass 02/14/2019   Other chest pain 02/14/2019   Superficial phlebitis, chest wall 02/14/2019   Fatigue 11/22/2018   Cough 11/08/2015   Essential hypertension 10/12/2010   Dermatomyositis ([employer]) 06/17/2008   Asthma 02/22/2007   Past Medical History:  Diagnosis Date   Arthritis of right hip 09/16/2020   X-ray October 2021   Asthma    Dermatomyositis ([place])    Hypertension    Past Surgical History: Past Surgical History:  Procedure Laterality Date   HAND SURGERY     INCISION AND DRAINAGE OF WOUND     MASS EXCISION Left 07/27/2019   Procedure: LEFT LONG FINGER NODULE EXCISION;  Surgeon: Evan Koyanagi, MD;  Location: [facility];  Service: Orthopedics;  Laterality: Left;   TYMPANOSTOMY TUBE PLACEMENT     Medication List:  Current Outpatient Medications  Medication Sig Dispense Refill   albuterol (VENTOLIN HFA) 108 (90 Base) MCG/ACT inhaler Inhale 2 puffs into the lungs every 6 (six) hours as needed for wheezing or shortness of breath. 18 g 11   amoxicillin-clavulanate (AUGMENTIN) 875-125 MG tablet Take 1 tablet by mouth 2 (two) times daily. 20 tablet 0   budesonide-formoterol (SYMBICORT) 160-4.5 MCG/ACT inhaler Inhale 2 puffs into the lungs 2 (two) times daily. 1 each 8   Butenafine HCl  1 % cream Apply twice daily to affected area 30 g 0   COD LIVER OIL PO Take 1 capsule by mouth daily.     Creatine POWD Take 1 Scoop by mouth.     diltiazem (CARDIZEM CD) 300 MG 24 hr capsule Take 1 capsule (300 mg total) by mouth daily. 90 capsule 1   folic acid (FOLVITE) 1 MG tablet Take 1 tablet (1 mg total) by mouth daily. 90 tablet 1   hydrochlorothiazide (HYDRODIURIL) 25 MG tablet Take 1 tablet (25 mg total) by mouth daily. 90 tablet 3   hydrocortisone 2.5 % cream Apply topically.     losartan (COZAAR) 100 MG tablet TAKE ONE TABLET BY MOUTH DAILY 90 tablet 1   meloxicam (MOBIC) 7.5 MG tablet Take by mouth with food daily or twice daily as needed 60 tablet 2   methotrexate (RHEUMATREX) 2.5 MG tablet Take 8 tablets (20 mg total) by mouth once a week. Caution:Chemotherapy. Protect from light. 40 tablet 2   mometasone (ELOCON) 0.1 % cream Apply topically.     Omega-3 Fatty Acids (FISH OIL) 1000 MG CAPS Take 1,000 mg by mouth daily.     tacrolimus (PROTOPIC) 0.03 % ointment Apply 1 application topically 2 (two) times daily.     triamcinolone cream (KENALOG) 0.1 % Apply topically.     VITAMIN D PO Take by mouth daily.     No current facility-administered medications for this visit.   Allergies: Allergies  Allergen Reactions   Sulfonamide Derivatives     REACTION: SWELLING (in childhood)   Cephalexin     REACTION: vomiting  No rash or fever   Lisinopril     REACTION: cough   Social History: Social History   Socioeconomic  History   Marital status: Married    Spouse name: Not on file   Number of children: Not on file   Years of education: Not on file   Highest education level: Not on file  Occupational History   Not on file  Tobacco Use   Smoking status: Never   Smokeless tobacco: Never  Vaping Use   Vaping Use: Never used  Substance and Sexual Activity   Alcohol use: No   Drug use: No   Sexual activity: Not on file  Other Topics Concern   Not on file  Social History  Narrative   Pt lives at home w/ parents       Very active at work - works two jobs   Social Determinants of Radio broadcast assistant Strain: Not on Art therapist Insecurity: Not on file  Transportation Needs: Not on file  Physical Activity: Not on file  Stress: Not on file  Social Connections: Not on file   Lives in a ***. Smoking: *** Occupation: ***  Environmental HistoryFreight forwarder in the house: Estate agent in the family room: {Blank single:19197::"yes","no"} Carpet in the bedroom: {Blank single:19197::"yes","no"} Heating: {Blank single:19197::"electric","gas","heat pump"} Cooling: {Blank single:19197::"central","window","heat pump"} Pet: {Blank single:19197::"yes ***","no"}  Family History: Family History  Problem Relation Age of Onset   Hypertension Mother    Diabetes Mother    Kidney disease Mother    Hyperlipidemia Mother    Arthritis Mother    Hypertension Father    Hyperlipidemia Father    Problem                               Relation Asthma                                   *** Eczema                                *** Food allergy                          *** Allergic rhino conjunctivitis     ***  Review of Systems  Constitutional:  Negative for appetite change, chills, fever and unexpected weight change.  HENT:  Negative for congestion and rhinorrhea.   Eyes:  Negative for itching.  Respiratory:  Negative for cough, chest tightness, shortness of breath and wheezing.   Cardiovascular:  Negative for chest pain.  Gastrointestinal:  Negative for abdominal pain.  Genitourinary:  Negative for difficulty urinating.  Skin:  Negative for rash.  Neurological:  Negative for headaches.    Objective: There were no vitals taken for this visit. There is no height or weight on file to calculate BMI. Physical Exam Vitals and nursing note reviewed.  Constitutional:      Appearance: Normal appearance. He is well-developed.   HENT:     Head: Normocephalic and atraumatic.     Right Ear: Tympanic membrane and external ear normal.     Left Ear: Tympanic membrane and external ear normal.     Nose: Nose normal.     Mouth/Throat:     Mouth: Mucous membranes are moist.     Pharynx: Oropharynx is clear.  Eyes:     Conjunctiva/sclera: Conjunctivae normal.  Cardiovascular:  Rate and Rhythm: Normal rate and regular rhythm.     Heart sounds: Normal heart sounds. No murmur heard.    No friction rub. No gallop.  Pulmonary:     Effort: Pulmonary effort is normal.     Breath sounds: Normal breath sounds. No wheezing, rhonchi or rales.  Musculoskeletal:     Cervical back: Neck supple.  Skin:    General: Skin is warm.     Findings: No rash.  Neurological:     Mental Status: He is alert and oriented to person, place, and time.  Psychiatric:        Behavior: Behavior normal.    The plan was reviewed with the patient/family, and all questions/concerned were addressed.  It was my pleasure to see Shaft today and participate in his care. Please feel free to contact me with any questions or concerns.  Sincerely,  Rexene Alberts, DO Allergy & Immunology  [facility] office: [place] office: [phone]

## 2022-08-31 NOTE — Progress Notes (Signed)
Office Visit Note  Patient: Evan Burns             Date of Birth: September 02, 1981           MRN: [id number]             PCP: Binnie Rail, MD Referring: Binnie Rail, MD Visit Date: 09/13/2022   Subjective:  Follow-up (Right hip pain)   History of Present Illness: Evan Burns is a 41 y.o. 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  Currently has some right hip pain in the groin and down to the medial knee has not bothered him a bit more than usual for about 2 days.  Intermittently has worsening pain in this distribution.  The meloxicam is pretty helpful but is usually taking less than once daily on average.  Skin rashes have remained pretty stable and he has not noticed specific new nodules.  Still having some changes at the lateral foot.  He has noticed a couple areas getting sensitive or having raised or irritated bumps at the right elbow nodules but no drainage or redness.  Previous HPI 06/14/2022 Evan Burns is a 41 y.o. male here for follow up for dermatomyositis with chronic calcinosis cutis currently on methotrexate 20 mg p.o. weekly folic acid 1 mg daily and is also on meloxicam 15 mg p.o. daily for right hip and knee joint pain.  Since starting the meloxicam he did notice improvement with his right hip pain actually feels like this was doing better and having more trouble of the knee than in the hip.  More recently had an episode where he he developed some left ankle pain and swelling but this resolved within a few weeks and not having a problem at the moment.  He had 1 episode of calcified nodule in the right elbow broke open wound was draining for about 3 days but this healed without any specific intervention.  Apparently his wife has noticed some increasing changes on the lateral side of his left foot but he does not notice any symptoms there.  Otherwise skin rash and nodules  are about the same.   Previous HPI 03/09/2022 Evan Burns is a 41 y.o. male here for follow up for dermatomyositis with chronic calcinosis cutis on methotrexate 15 mg PO weekly and folic acid 1 mg daily. He is tolerating methotrexate without any major problems. He has developed increased skin peeling and itching on his face and neck. He has persistent right hip and knee pain and has some stiffness worst first thing in the morning. He notices upper leg weakness about 2 days out of the week.   Previous HPI 11/18/2021 Evan Burns is a 41 y.o. male here for follow up for dermatomyositis with chronic calcinosis cutis on methotrexate 15 mg PO weekly and folic acid 1 mg daily. He continues to have right hip pain also having right knee pain. Notices some fatigue when taking the methotrexate, no other intolerance or side effect notices. He saw Dr. Tempie Donning for treatment of right elbow abscess that has healed well no ongoing pain or inflammation and off antibiotics. No other infections or major events.   Previous HPI 08/24/21 [name] is a 41 y.o. male here for follow up for dermatomyositis with chronic calcinosis cutis on methotrexate 15 mg PO weekly since about 3 months ago. He has not noticed any difference since  our last visit. On further discission he was apparently taking only 1 tablet weekly of the methotrexate so far below recommended dose. He had some new skin peeling over his face increased since this summer and saw dermatology for this multiple times. They tried a few topical medication without response but now using tacrolimus with improvement.   Previous HPI 10/02/2020 History of Present Illness: Evan Burns is a 41 y.o. male with a history of dermatomyositis here for evaluation of myalgias and skin problems. He has a long history of DM diagnosed around 20 years ago originally with muscle and skin disease activity. He was treated with steroids for this with apparent resolution  of his muscle inflammation. Since that time he has had continued development of calcifications that sometimes become swollen or infected but no known recurrence of muscular involvement. He takes treatments for asthma but no known ILD. Today he complains of joint pain in multiple sites the right groin, left ankle, and right shoulder. He has had previous imaging of the shoulder and hip showing right shoulder bursitis and moderately advanced OA of the right hip. He has not had imaging for the left ankle pain. He does see swelling around the left ankle sometimes and worse after working all day. He has not noticed any focal weakness except around his right hip and thigh. He denies cough, dyspnea, fevers, weight loss, or lymphadenopathy.   Review of Systems  Constitutional:  Positive for fatigue.  HENT:  Positive for mouth dryness. Negative for mouth sores.   Eyes:  Negative for dryness.  Respiratory:  Negative for shortness of breath.   Cardiovascular:  Negative for chest pain and palpitations.  Gastrointestinal:  Negative for blood in stool, constipation and diarrhea.  Endocrine: Negative for increased urination.  Genitourinary:  Negative for involuntary urination.  Musculoskeletal:  Positive for joint pain, joint pain, myalgias, muscle weakness, morning stiffness and myalgias. Negative for gait problem, joint swelling and muscle tenderness.  Skin:  Positive for color change and rash. Negative for hair loss and sensitivity to sunlight.  Allergic/Immunologic: Negative for susceptible to infections.  Neurological:  Positive for headaches. Negative for dizziness.  Hematological:  Negative for swollen glands.  Psychiatric/Behavioral:  Negative for depressed mood and sleep disturbance. The patient is not nervous/anxious.     PMFS History:  Patient Active Problem List   Diagnosis Date Noted   Cough variant asthma 09/01/2022   Other allergic rhinitis 09/01/2022   Asthmatic bronchitis 08/23/2022    Facial rash 03/09/2022   COVID-19 01/08/2022   Abscess of left elbow 10/01/2021   High risk medication use 06/01/2021   Vitamin D deficiency 03/19/2021   Lower leg edema 01/09/2021   Pain in left ankle and joints of left foot 10/02/2020   Calcinosis cutis 10/02/2020   Arthritis of right hip 09/16/2020   Tinea cruris 07/28/2020   Leg pain, bilateral 07/28/2020   Prediabetes 03/19/2020   Chest mass 02/14/2019   Other chest pain 02/14/2019   Superficial phlebitis, chest wall 02/14/2019   Fatigue 11/22/2018   Cough 11/08/2015   Possible GERD 10/29/2010   Essential hypertension 10/12/2010   Dermatomyositis ([place]) 06/17/2008   Asthma 02/22/2007    Past Medical History:  Diagnosis Date   Arthritis of right hip 09/16/2020   X-ray October 2021   Asthma    Dermatomyositis ([address])    Hypertension     Family History  Problem Relation Age of Onset   Asthma Mother    Hypertension Mother  Diabetes Mother    Kidney disease Mother    Hyperlipidemia Mother    Arthritis Mother    Hypertension Father    Hyperlipidemia Father    Past Surgical History:  Procedure Laterality Date   HAND SURGERY     INCISION AND DRAINAGE OF WOUND     MASS EXCISION Left 07/27/2019   Procedure: LEFT LONG FINGER NODULE EXCISION;  Surgeon: Leandrew Koyanagi, MD;  Location: [facility];  Service: Orthopedics;  Laterality: Left;   TYMPANOSTOMY TUBE PLACEMENT     Social History   Social History Narrative   Pt lives at home w/ parents       Very active at work - works two jobs   Immunization History  Administered Date(s) Administered   Influenza Split 08/25/2011, 07/25/2012   Influenza Whole 08/08/2007, 08/14/2008, 07/23/2009, 07/21/2010   Influenza,inj,Quad PF,6+ Mos 07/24/2013, 07/23/2014, 07/01/2015, 07/05/2016, 08/06/2017, 08/04/2018, 09/07/2019, 07/28/2020, 07/03/2021, 07/26/2022   PFIZER Comirnaty(Gray Top)Covid-19 Tri-Sucrose Vaccine 08/06/2022   PFIZER(Purple Top)SARS-COV-2 Vaccination  01/07/2020, 01/28/2020, 09/22/2020   Pfizer Covid-19 Vaccine Bivalent Booster 37yr & up 08/04/2021   Pneumococcal Conjugate-13 09/21/2021   Pneumococcal Polysaccharide-23 10/01/2014   Tdap 09/13/2013, 04/18/2019     Objective: Vital Signs: BP 136/84 (BP Location: Left Arm, Patient Position: Sitting, Cuff Size: Normal)   Pulse 69   Resp 15   Ht _0  (1.702 m)   Wt 254 lb (115.2 kg)   BMI 39.78 kg/m    Physical Exam Cardiovascular:     Rate and Rhythm: Normal rate and regular rhythm.  Pulmonary:     Effort: Pulmonary effort is normal.     Breath sounds: Normal breath sounds.  Musculoskeletal:     Right lower leg: No edema.     Left lower leg: No edema.  Skin:    General: Skin is warm and dry.     Findings: Rash present.     Comments: Extensive hyperpigmented rash on face and bilateral arms, numerous large firm subcutaneous skin nodules on elbows forearms dorsal aspect of hands bilaterally  Neurological:     Mental Status: He is alert.  Psychiatric:        Mood and Affect: Mood normal.   Musculoskeletal Exam:  Shoulders full ROM no tenderness or swelling Elbows full ROM no tenderness or swelling Wrists full ROM no tenderness or swelling Fingers full ROM no tenderness or swelling Right hip with significantly decreased internal rotation compared to left side, no tenderness to palpation on lateral hips Knees full ROM no tenderness or swelling Ankles full ROM no tenderness or swelling, there is some slight crepitus or catching in the left ankle with inversion eversion range of motion  Investigation: No additional findings.  Imaging: No results found.  Recent Labs: Lab Results  Component Value Date   WBC 5.1 06/14/2022   HGB 13.9 06/14/2022   PLT 234 06/14/2022   NA 141 06/14/2022   K 4.0 06/14/2022   CL 107 06/14/2022   CO2 24 06/14/2022   GLUCOSE 92 06/14/2022   BUN 17 06/14/2022   CREATININE 1.19 06/14/2022   BILITOT 0.4 06/14/2022   ALKPHOS 45 09/21/2021    AST 26 06/14/2022   ALT 32 06/14/2022   PROT 6.7 06/14/2022   ALBUMIN 4.4 09/21/2021   CALCIUM 9.1 06/14/2022   GFRAA  02/01/2010    >60        The eGFR has been calculated using the MDRD equation. This calculation has not been validated in all clinical situations. eGFR's  persistently <60 mL/min signify possible Chronic Kidney Disease.    Speciality Comments: No specialty comments available.  Procedures:  No procedures performed Allergies: Sulfonamide derivatives, Cephalexin, and Lisinopril   Assessment / Plan:     Visit Diagnoses: Dermatomyositis ([place])  So far no appreciable response to skin disease with several months on treatment with methotrexate and dose titration.  Persistent mild CK elevations I suspect are more related to his physical activity and muscle bulk more so than disease activity and clinically no weakness or myalgia.  Appears to have some increase with the cutaneous calcifications over time though most changes are chronic.  He has been seeing dermatology with several topical medications tried but there is been progression of symptoms.  I recommend it might be beneficial to get another opinion will refer to [facility] dermat[facility] Dr. Barry Dienes for any additional recommendations.  Arthritis of right hip - Plan: meloxicam (MOBIC) 7.5 MG tablet  Right hip pain and slightly decreased range of motion consistent with osteoarthritis.  Appears stable from our last visit he is having a pretty good improvement with the meloxicam not requiring maximal dosing.  Orders: Orders Placed This Encounter  Procedures   Ambulatory referral to Dermatology   Meds ordered this encounter  Medications   meloxicam (MOBIC) 7.5 MG tablet    Sig: Take by mouth with food daily or twice daily as needed    Dispense:  60 tablet    Refill:  5     Follow-Up Instructions: Return in about 6 months (around 03/14/2023) for DM/OA on meloxicam f/u 54mo.   CCollier Salina MD  Note -  This record has been created using [employer]  Chart creation errors have been sought, but may not always  have been located. Such creation errors do not reflect on  the standard of medical care.

## 2022-09-01 ENCOUNTER — Encounter: Payer: Self-pay | Admitting: Allergy

## 2022-09-01 ENCOUNTER — Ambulatory Visit (INDEPENDENT_AMBULATORY_CARE_PROVIDER_SITE_OTHER): Payer: Federal, State, Local not specified - PPO | Admitting: Allergy

## 2022-09-01 ENCOUNTER — Other Ambulatory Visit: Payer: Self-pay

## 2022-09-01 VITALS — BP 138/72 | HR 82 | Temp 97.8°F | Resp 17 | Ht 68.0 in | Wt 258.9 lb

## 2022-09-01 DIAGNOSIS — J3089 Other allergic rhinitis: Secondary | ICD-10-CM | POA: Diagnosis not present

## 2022-09-01 DIAGNOSIS — J45998 Other asthma: Secondary | ICD-10-CM | POA: Diagnosis not present

## 2022-09-01 DIAGNOSIS — K219 Gastro-esophageal reflux disease without esophagitis: Secondary | ICD-10-CM

## 2022-09-01 DIAGNOSIS — J45991 Cough variant asthma: Secondary | ICD-10-CM | POA: Diagnosis not present

## 2022-09-01 DIAGNOSIS — J302 Other seasonal allergic rhinitis: Secondary | ICD-10-CM | POA: Insufficient documentation

## 2022-09-01 MED ORDER — RYALTRIS 665-25 MCG/ACT NA SUSP: 1.0000 | Freq: Two times a day (BID) | NASAL | 5 refills | Status: DC

## 2022-09-01 MED ORDER — LEVOCETIRIZINE DIHYDROCHLORIDE 5 MG PO TABS: 5.0000 mg | ORAL_TABLET | Freq: Every evening | ORAL | 3 refills | Status: AC

## 2022-09-01 MED ORDER — BREZTRI AEROSPHERE 160-9-4.8 MCG/ACT IN AERO: 2.0000 | INHALATION_SPRAY | Freq: Two times a day (BID) | RESPIRATORY_TRACT | 3 refills | Status: DC

## 2022-09-01 NOTE — Patient Instructions (Addendum)
Today's skin testing showed: Positive to grass, ragweed, weed, trees, mold, cat, dog, cockroach, dust mites.  Negative to common foods.   Results given.  Coughing Daily controller medication(s): start Breztri 2 puffs twice a day with spacer and rinse mouth afterwards. Samples given. Spacer given and demonstrated proper use with inhaler. Patient understood technique and all questions/concerned were addressed.  Let me know if it's not covered. Stop Symbicort.  May use albuterol rescue inhaler 2 puffs every 4 to 6 hours as needed for shortness of breath, chest tightness, coughing, and wheezing. May use albuterol rescue inhaler 2 puffs 5 to 15 minutes prior to strenuous physical activities. Monitor frequency of use.  Asthma control goals:  Full participation in all desired activities (may need albuterol before activity) Albuterol use two times or less a week on average (not counting use with activity) Cough interfering with sleep two times or less a month Oral steroids no more than once a year No hospitalizations   Environmental allergies Start environmental control measures as below. Use over the counter antihistamines such as Zyrtec (cetirizine), Claritin (loratadine), Allegra (fexofenadine), or Xyzal (levocetirizine) daily as needed. May take twice a day during allergy flares. May switch antihistamines every few months. Start Ryaltris (olopatadine + mometasone nasal spray combination) 1-2 sprays per nostril twice a day. Sample given. This replaces your other nasal sprays. If this works well for you, then have [facility] ship the medication to your home - prescription already sent in.  Consider allergy injections for long term control if above medications do not help the symptoms - handout given.   Heartburn: See handout for lifestyle and dietary modifications.  Follow up in 2 months or sooner if needed.    Reducing Pollen Exposure Pollen seasons: trees (spring), grass (summer) and  ragweed/weeds (fall). Keep windows closed in your home and car to lower pollen exposure.  Install air conditioning in the bedroom and throughout the house if possible.  Avoid going out in dry windy days - especially early morning. Pollen counts are highest between 5 - 10 AM and on dry, hot and windy days.  Save outside activities for late afternoon or after a heavy rain, when pollen levels are lower.  Avoid mowing of grass if you have grass pollen allergy. Be aware that pollen can also be transported indoors on people and pets.  Dry your clothes in an automatic dryer rather than hanging them outside where they might collect pollen.  Rinse hair and eyes before bedtime. Mold Control Mold and fungi can grow on a variety of surfaces provided certain temperature and moisture conditions exist.  Outdoor molds grow on plants, decaying vegetation and soil. The major outdoor mold, Alternaria and Cladosporium, are found in very high numbers during hot and dry conditions. Generally, a late summer - fall peak is seen for common outdoor fungal spores. Rain will temporarily lower outdoor mold spore count, but counts rise rapidly when the rainy period ends. The most important indoor molds are Aspergillus and Penicillium. Dark, humid and poorly ventilated basements are ideal sites for mold growth. The next most common sites of mold growth are the bathroom and the kitchen. Outdoor (Seasonal) Mold Control Use air conditioning and keep windows closed. Avoid exposure to decaying vegetation. Avoid leaf raking. Avoid grain handling. Consider wearing a face mask if working in moldy areas.  Indoor (Perennial) Mold Control  Maintain humidity below 50%. Get rid of mold growth on hard surfaces with water, detergent and, if necessary, 5% bleach (do not mix  with other cleaners). Then dry the area completely. If mold covers an area more than 10 square feet, consider hiring an indoor environmental professional. For clothing,  washing with soap and water is best. If moldy items cannot be cleaned and dried, throw them away. Remove sources e.g. contaminated carpets. Repair and seal leaking roofs or pipes. Using dehumidifiers in damp basements may be helpful, but empty the water and clean units regularly to prevent mildew from forming. All rooms, especially basements, bathrooms and kitchens, require ventilation and cleaning to deter mold and mildew growth. Avoid carpeting on concrete or damp floors, and storing items in damp areas. Control of House Dust Mite Allergen Dust mite allergens are a common trigger of allergy and asthma symptoms. While they can be found throughout the house, these microscopic creatures thrive in warm, humid environments such as bedding, upholstered furniture and carpeting. Because so much time is spent in the bedroom, it is essential to reduce mite levels there.  Encase pillows, mattresses, and box springs in special allergen-proof fabric covers or airtight, zippered plastic covers.  Bedding should be washed weekly in hot water (130 F) and dried in a hot dryer. Allergen-proof covers are available for comforters and pillows that can't be regularly washed.  Wash the allergy-proof covers every few months. Minimize clutter in the bedroom. Keep pets out of the bedroom.  Keep humidity less than 50% by using a dehumidifier or air conditioning. You can buy a humidity measuring device called a hygrometer to monitor this.  If possible, replace carpets with hardwood, linoleum, or washable area rugs. If that's not possible, vacuum frequently with a vacuum that has a HEPA filter. Remove all upholstered furniture and non-washable window drapes from the bedroom. Remove all non-washable stuffed toys from the bedroom.  Wash stuffed toys weekly. Pet Allergen Avoidance: Contrary to popular opinion, there are no "hypoallergenic" breeds of dogs or cats. That is because people are not allergic to an animal's hair, but to  an allergen found in the animal's saliva, dander (dead skin flakes) or urine. Pet allergy symptoms typically occur within minutes. For some people, symptoms can build up and become most severe 8 to 12 hours after contact with the animal. People with severe allergies can experience reactions in public places if dander has been transported on the pet owners' clothing. Keeping an animal outdoors is only a partial solution, since homes with pets in the yard still have higher concentrations of animal allergens. Before getting a pet, ask your allergist to determine if you are allergic to animals. If your pet is already considered part of your family, try to minimize contact and keep the pet out of the bedroom and other rooms where you spend a great deal of time. As with dust mites, vacuum carpets often or replace carpet with a hardwood floor, tile or linoleum. High-efficiency particulate air (HEPA) cleaners can reduce allergen levels over time. While dander and saliva are the source of cat and dog allergens, urine is the source of allergens from rabbits, hamsters, mice and [place] pigs; so ask a non-allergic family member to clean the animal's cage. If you have a pet allergy, talk to your allergist about the potential for allergy immunotherapy (allergy shots). This strategy can often provide long-term relief. Cockroach Allergen Avoidance Cockroaches are often found in the homes of densely populated urban areas, schools or commercial buildings, but these creatures can lurk almost anywhere. This does not mean that you have a dirty house or living area. Block all areas  where roaches can enter the home. This includes crevices, wall cracks and windows.  Cockroaches need water to survive, so fix and seal all leaky faucets and pipes. Have an exterminator go through the house when your family and pets are gone to eliminate any remaining roaches. Keep food in lidded containers and put pet food dishes away after your pets  are done eating. Vacuum and sweep the floor after meals, and take out garbage and recyclables. Use lidded garbage containers in the kitchen. Wash dishes immediately after use and clean under stoves, refrigerators or toasters where crumbs can accumulate. Wipe off the stove and other kitchen surfaces and cupboards regularly.

## 2022-09-01 NOTE — Assessment & Plan Note (Signed)
Noted some rhinitis symptoms mainly in the spring and takes Claritin with good benefit.  No prior AIT.  1 dog at home. Today's skin testing showed: Positive to grass, ragweed, weed, trees, mold, cat, dog, cockroach, dust mites.  Concerning if the dog maybe contributing to his coughing symptoms as well.  Start environmental control measures as below. Use over the counter antihistamines such as Zyrtec (cetirizine), Claritin (loratadine), Allegra (fexofenadine), or Xyzal (levocetirizine) daily as needed. May take twice a day during allergy flares. May switch antihistamines every few months. Start Ryaltris (olopatadine + mometasone nasal spray combination) 1-2 sprays per nostril twice a day. Sample given. This replaces your other nasal sprays. If this works well for you, then have [facility] ship the medication to your home - prescription already sent in.  Consider allergy injections for long term control if above medications do not help the symptoms - handout given.

## 2022-09-01 NOTE — Assessment & Plan Note (Signed)
Diagnosed with asthma as a child however the last 3 weeks having a flare with coughing with posttussive emesis at times.  Had similar episode 8 months ago as well.  Currently on Symbicort 160 2 puffs twice a day and using albuterol daily.  Symptoms seems to be worse at night.  Did a trial of PPI with no benefit.  No recent chest x-ray.  1 new dog the last year.  Takes methotrexate for dermatomyositis. Today's spirometry was normal with 8% improvement in FEV1 postbronchodilator treatment.  Clinically feeling improved. Today's skin testing showed: Positive to grass, ragweed, weed, trees, mold, cat, dog, cockroach, dust mites. Negative to common foods.  The most common causes of chronic cough include the following: upper airway cough syndrome (UACS) which is caused by variety of rhinitis conditions; asthma; gastroesophageal reflux disease (GERD); chronic bronchitis from cigarette smoking or other inhaled environmental irritants; non-asthmatic eosinophilic bronchitis; and bronchiectasis.  In prospective studies, these conditions have accounted for up to 94% of the causes of chronic cough in immunocompetent adults.  The history and physical examination suggest that his cough is multifactorial. Daily controller medication(s): start Breztri 2 puffs twice a day with spacer and rinse mouth afterwards. Samples given. Spacer given and demonstrated proper use with inhaler. Patient understood technique and all questions/concerned were addressed.  Let me know if it's not covered. Stop Symbicort.  May use albuterol rescue inhaler 2 puffs every 4 to 6 hours as needed for shortness of breath, chest tightness, coughing, and wheezing. May use albuterol rescue inhaler 2 puffs 5 to 15 minutes prior to strenuous physical activities. Monitor frequency of use.  Get spirometry at next visit.

## 2022-09-01 NOTE — Assessment & Plan Note (Signed)
Tried PPI briefly with no benefit. See handout for lifestyle and dietary modifications.

## 2022-09-13 ENCOUNTER — Ambulatory Visit: Payer: Federal, State, Local not specified - PPO | Attending: Internal Medicine | Admitting: Internal Medicine

## 2022-09-13 ENCOUNTER — Encounter: Payer: Self-pay | Admitting: Internal Medicine

## 2022-09-13 VITALS — BP 136/84 | HR 69 | Resp 15 | Ht 67.0 in | Wt 254.0 lb

## 2022-09-13 DIAGNOSIS — M3390 Dermatopolymyositis, unspecified, organ involvement unspecified: Secondary | ICD-10-CM | POA: Diagnosis not present

## 2022-09-13 DIAGNOSIS — M1611 Unilateral primary osteoarthritis, right hip: Secondary | ICD-10-CM

## 2022-09-13 DIAGNOSIS — Z79899 Other long term (current) drug therapy: Secondary | ICD-10-CM

## 2022-09-13 MED ORDER — MELOXICAM 7.5 MG PO TABS: ORAL_TABLET | ORAL | 5 refills | Status: DC

## 2022-09-20 ENCOUNTER — Ambulatory Visit: Payer: Federal, State, Local not specified - PPO | Admitting: Internal Medicine

## 2022-09-27 NOTE — Patient Instructions (Signed)
      Blood work was ordered.   The lab is on the first floor.    Medications changes include :   increase diltiazem to 360 mg daily      Return in about 6 months (around 03/30/2023) for Physical Exam.

## 2022-09-27 NOTE — Progress Notes (Unsigned)
Subjective:    Patient ID: Evan Burns, male    DOB: 05/06/1981, 41 y.o.   MRN: [id number]     HPI Sevag is here for follow up of his chronic medical problems, including htn, prediabetes, dermatomyositis, asthma  Noticed two lumps in the posterior neck.  Noticed them 3-4 days ago - they are non tender.    He is exercising regularly.     Has not been checking BP  Medications and allergies reviewed with patient and updated if appropriate.  Current Outpatient Medications on File Prior to Visit  Medication Sig Dispense Refill   albuterol (VENTOLIN HFA) 108 (90 Base) MCG/ACT inhaler Inhale 2 puffs into the lungs every 6 (six) hours as needed for wheezing or shortness of breath. 18 g 11   Budeson-Glycopyrrol-Formoterol (BREZTRI AEROSPHERE) 160-9-4.8 MCG/ACT AERO Inhale 2 puffs into the lungs in the morning and at bedtime. with spacer and rinse mouth afterwards. 10.7 g 3   Butenafine HCl 1 % cream Apply twice daily to affected area 30 g 0   COD LIVER OIL PO Take 1 capsule by mouth daily.     Creatine POWD Take 1 Scoop by mouth.     diltiazem (CARDIZEM CD) 300 MG 24 hr capsule Take 1 capsule (300 mg total) by mouth daily. 90 capsule 1   hydrochlorothiazide (HYDRODIURIL) 25 MG tablet Take 1 tablet (25 mg total) by mouth daily. 90 tablet 3   hydrocortisone 2.5 % cream Apply topically.     levocetirizine (XYZAL) 5 MG tablet Take 1 tablet (5 mg total) by mouth every evening. 30 tablet 3   losartan (COZAAR) 100 MG tablet TAKE ONE TABLET BY MOUTH DAILY 90 tablet 1   meloxicam (MOBIC) 7.5 MG tablet Take by mouth with food daily or twice daily as needed 60 tablet 5   mometasone (ELOCON) 0.1 % cream Apply topically.     Olopatadine-Mometasone (RYALTRIS) [id number] MCG/ACT SUSP Place 1-2 sprays into the nose in the morning and at bedtime. 29 g 5   Omega-3 Fatty Acids (FISH OIL) 1000 MG CAPS Take 1,000 mg by mouth daily.     tacrolimus (PROTOPIC) 0.03 % ointment Apply 1 application  topically 2 (two) times daily.     triamcinolone cream (KENALOG) 0.1 % Apply topically.     VITAMIN D PO Take by mouth daily.     No current facility-administered medications on file prior to visit.     Review of Systems  Constitutional:  Negative for chills and fever.  HENT:  Positive for voice change.   Respiratory:  Positive for cough (occ). Negative for chest tightness, shortness of breath and wheezing.   Cardiovascular:  Negative for chest pain, palpitations and leg swelling.  Neurological:  Negative for light-headedness and headaches.  Hematological:  Positive for adenopathy (3-4 days of occipial lymph nodes palpable - nontender - 2 total).       Objective:   Vitals:   09/28/22 1432  BP: (!) 140/80  Pulse: 80  Temp: 98.4 F (36.9 C)  SpO2: 97%   BP Readings from Last 3 Encounters:  09/28/22 (!) 140/80  09/13/22 136/84  09/01/22 138/72   Wt Readings from Last 3 Encounters:  09/28/22 256 lb (116.1 kg)  09/13/22 254 lb (115.2 kg)  09/01/22 258 lb 14.4 oz (117.4 kg)   Body mass index is 40.1 kg/m.    Physical Exam Constitutional:      General: He is not in acute distress.    Appearance:  Normal appearance. He is not ill-appearing.  HENT:     Head: Normocephalic and atraumatic.  Eyes:     Conjunctiva/sclera: Conjunctivae normal.  Cardiovascular:     Rate and Rhythm: Normal rate and regular rhythm.     Heart sounds: Normal heart sounds. No murmur heard. Pulmonary:     Effort: Pulmonary effort is normal. No respiratory distress.     Breath sounds: Normal breath sounds. No wheezing or rales.  Musculoskeletal:     Right lower leg: No edema.     Left lower leg: No edema.  Lymphadenopathy:     Head:     Right side of head: No submental, submandibular, preauricular, posterior auricular or occipital adenopathy.     Left side of head: Occipital (2 nontender palpable LN's - one posterior mid neck and the other left lower neck) adenopathy present. No submental,  submandibular, preauricular or posterior auricular adenopathy.     Cervical:     Right cervical: No superficial or posterior cervical adenopathy.    Left cervical: No superficial or posterior cervical adenopathy.     Upper Body:     Right upper body: No supraclavicular adenopathy.     Left upper body: No supraclavicular adenopathy.  Skin:    General: Skin is warm and dry.     Findings: No rash.  Neurological:     Mental Status: He is alert. Mental status is at baseline.  Psychiatric:        Mood and Affect: Mood normal.        Lab Results  Component Value Date   WBC 5.1 06/14/2022   HGB 13.9 06/14/2022   HCT 43.6 06/14/2022   PLT 234 06/14/2022   GLUCOSE 92 06/14/2022   CHOL 121 03/20/2021   TRIG 74.0 03/20/2021   HDL 35.90 (L) 03/20/2021   LDLCALC 70 03/20/2021   ALT 32 06/14/2022   AST 26 06/14/2022   NA 141 06/14/2022   K 4.0 06/14/2022   CL 107 06/14/2022   CREATININE 1.19 06/14/2022   BUN 17 06/14/2022   CO2 24 06/14/2022   TSH 0.95 03/18/2020   HGBA1C 5.5 03/23/2022   HGBA1C 5.5 03/23/2022   HGBA1C 5.5 (A) 03/23/2022   HGBA1C 5.5 03/23/2022     Assessment & Plan:    See Problem List for Assessment and Plan of chronic medical problems.

## 2022-09-28 ENCOUNTER — Encounter: Payer: Self-pay | Admitting: Internal Medicine

## 2022-09-28 ENCOUNTER — Ambulatory Visit: Payer: Federal, State, Local not specified - PPO | Admitting: Internal Medicine

## 2022-09-28 VITALS — BP 134/78 | HR 80 | Temp 98.4°F | Ht 67.0 in | Wt 256.0 lb

## 2022-09-28 DIAGNOSIS — R590 Localized enlarged lymph nodes: Secondary | ICD-10-CM

## 2022-09-28 DIAGNOSIS — I10 Essential (primary) hypertension: Secondary | ICD-10-CM

## 2022-09-28 DIAGNOSIS — J4520 Mild intermittent asthma, uncomplicated: Secondary | ICD-10-CM

## 2022-09-28 DIAGNOSIS — R7303 Prediabetes: Secondary | ICD-10-CM | POA: Diagnosis not present

## 2022-09-28 LAB — COMPREHENSIVE METABOLIC PANEL
ALT: 28 U/L (ref 0–53)
AST: 24 U/L (ref 0–37)
Albumin: 4.3 g/dL (ref 3.5–5.2)
Alkaline Phosphatase: 46 U/L (ref 39–117)
BUN: 16 mg/dL (ref 6–23)
CO2: 31 mEq/L (ref 19–32)
Calcium: 9 mg/dL (ref 8.4–10.5)
Chloride: 106 mEq/L (ref 96–112)
Creatinine, Ser: 1.2 mg/dL (ref 0.40–1.50)
GFR: 75.56 mL/min (ref >60.00)
Glucose, Bld: 94 mg/dL (ref 70–99)
Potassium: 4.3 mEq/L (ref 3.5–5.1)
Sodium: 142 mEq/L (ref 135–145)
Total Bilirubin: 0.5 mg/dL (ref 0.2–1.2)
Total Protein: 6.7 g/dL (ref 6.0–8.3)

## 2022-09-28 LAB — CBC WITH DIFFERENTIAL/PLATELET
Basophils Absolute: 0.1 10*3/uL (ref 0.0–0.1)
Basophils Relative: 2.4 % (ref 0.0–3.0)
Eosinophils Absolute: 0.2 10*3/uL (ref 0.0–0.7)
Eosinophils Relative: 4.6 % (ref 0.0–5.0)
HCT: 43.1 % (ref 39.0–52.0)
Hemoglobin: 13.9 g/dL (ref 13.0–17.0)
Lymphocytes Relative: 35.7 % (ref 12.0–46.0)
Lymphs Abs: 1.9 10*3/uL (ref 0.7–4.0)
MCHC: 32.2 g/dL (ref 30.0–36.0)
MCV: 83 fl (ref 78.0–100.0)
Monocytes Absolute: 0.4 10*3/uL (ref 0.1–1.0)
Monocytes Relative: 7.7 % (ref 3.0–12.0)
Neutro Abs: 2.6 10*3/uL (ref 1.4–7.7)
Neutrophils Relative %: 49.6 % (ref 43.0–77.0)
Platelets: 238 10*3/uL (ref 150.0–400.0)
RBC: 5.19 Mil/uL (ref 4.22–5.81)
RDW: 13.6 % (ref 11.5–15.5)
WBC: 5.3 10*3/uL (ref 4.0–10.5)

## 2022-09-28 LAB — HEMOGLOBIN A1C: Hgb A1c MFr Bld: 5.9 % (ref 4.6–6.5)

## 2022-09-28 MED ORDER — ALBUTEROL SULFATE HFA 108 (90 BASE) MCG/ACT IN AERS: 2.0000 | INHALATION_SPRAY | Freq: Four times a day (QID) | RESPIRATORY_TRACT | 11 refills | Status: DC | PRN

## 2022-09-28 MED ORDER — BREZTRI AEROSPHERE 160-9-4.8 MCG/ACT IN AERO: 2.0000 | INHALATION_SPRAY | Freq: Two times a day (BID) | RESPIRATORY_TRACT | 3 refills | Status: DC

## 2022-09-28 MED ORDER — DILTIAZEM HCL ER COATED BEADS 360 MG PO CP24: 360.0000 mg | ORAL_CAPSULE | Freq: Every day | ORAL | 1 refills | Status: DC

## 2022-09-28 NOTE — Assessment & Plan Note (Signed)
Chronic Following with allergy On Breztri bid and albuterol prn - asthma better controlled

## 2022-09-28 NOTE — Assessment & Plan Note (Addendum)
Chronic Blood pressure not well controlled CMP Continue HCTZ 25 mg daily, losartan 100 mg daily Increase diltiazem to 360 mg daily

## 2022-09-28 NOTE — Assessment & Plan Note (Signed)
Chronic Check a1c Low sugar / carb diet Stressed regular exercise  Lab Results  Component Value Date   HGBA1C 5.5 03/23/2022   HGBA1C 5.5 03/23/2022   HGBA1C 5.5 (A) 03/23/2022   HGBA1C 5.5 03/23/2022

## 2022-09-28 NOTE — Assessment & Plan Note (Addendum)
New 2 palpable LN's - noticed it 3-4 days ago - nontender and no other palpable nodes No obvious cause Will monitor for now cbc

## 2022-10-20 ENCOUNTER — Other Ambulatory Visit: Payer: Self-pay | Admitting: Internal Medicine

## 2022-11-01 ENCOUNTER — Encounter: Payer: Self-pay | Admitting: Internal Medicine

## 2022-11-01 NOTE — Progress Notes (Unsigned)
    Subjective:    Patient ID: Evan Burns, male    DOB: 1981-09-21, 42 y.o.   MRN: [id number]      HPI Kazimierz is here for No chief complaint on file.    Cough, headache -     Medications and allergies reviewed with patient and updated if appropriate.  Current Outpatient Medications on File Prior to Visit  Medication Sig Dispense Refill   albuterol (VENTOLIN HFA) 108 (90 Base) MCG/ACT inhaler Inhale 2 puffs into the lungs every 6 (six) hours as needed for wheezing or shortness of breath. 18 g 11   Budeson-Glycopyrrol-Formoterol (BREZTRI AEROSPHERE) 160-9-4.8 MCG/ACT AERO Inhale 2 puffs into the lungs in the morning and at bedtime. with spacer and rinse mouth afterwards. 10.7 g 3   Butenafine HCl 1 % cream Apply twice daily to affected area 30 g 0   COD LIVER OIL PO Take 1 capsule by mouth daily.     Creatine POWD Take 1 Scoop by mouth.     diltiazem (CARDIZEM CD) 300 MG 24 hr capsule TAKE 1 CAPSULE BY MOUTH DAILY 90 capsule 1   diltiazem (CARDIZEM CD) 360 MG 24 hr capsule Take 1 capsule (360 mg total) by mouth daily. 90 capsule 1   hydrochlorothiazide (HYDRODIURIL) 25 MG tablet Take 1 tablet (25 mg total) by mouth daily. 90 tablet 3   hydrocortisone 2.5 % cream Apply topically.     levocetirizine (XYZAL) 5 MG tablet Take 1 tablet (5 mg total) by mouth every evening. 30 tablet 3   losartan (COZAAR) 100 MG tablet TAKE ONE TABLET BY MOUTH DAILY 90 tablet 1   meloxicam (MOBIC) 7.5 MG tablet Take by mouth with food daily or twice daily as needed 60 tablet 5   mometasone (ELOCON) 0.1 % cream Apply topically.     Olopatadine-Mometasone (RYALTRIS) [id number] MCG/ACT SUSP Place 1-2 sprays into the nose in the morning and at bedtime. 29 g 5   Omega-3 Fatty Acids (FISH OIL) 1000 MG CAPS Take 1,000 mg by mouth daily.     tacrolimus (PROTOPIC) 0.03 % ointment Apply 1 application topically 2 (two) times daily.     triamcinolone cream (KENALOG) 0.1 % Apply topically.     VITAMIN D PO Take by  mouth daily.     No current facility-administered medications on file prior to visit.    Review of Systems     Objective:  There were no vitals filed for this visit. BP Readings from Last 3 Encounters:  09/28/22 134/78  09/13/22 136/84  09/01/22 138/72   Wt Readings from Last 3 Encounters:  09/28/22 256 lb (116.1 kg)  09/13/22 254 lb (115.2 kg)  09/01/22 258 lb 14.4 oz (117.4 kg)   There is no height or weight on file to calculate BMI.    Physical Exam         Assessment & Plan:    See Problem List for Assessment and Plan of chronic medical problems.

## 2022-11-02 ENCOUNTER — Ambulatory Visit: Payer: Federal, State, Local not specified - PPO | Admitting: Internal Medicine

## 2022-11-02 VITALS — BP 144/92 | HR 82 | Temp 99.1°F | Ht 67.0 in | Wt 251.0 lb

## 2022-11-02 DIAGNOSIS — K219 Gastro-esophageal reflux disease without esophagitis: Secondary | ICD-10-CM

## 2022-11-02 DIAGNOSIS — J4541 Moderate persistent asthma with (acute) exacerbation: Secondary | ICD-10-CM | POA: Diagnosis not present

## 2022-11-02 DIAGNOSIS — J45901 Unspecified asthma with (acute) exacerbation: Secondary | ICD-10-CM | POA: Insufficient documentation

## 2022-11-02 MED ORDER — OMEPRAZOLE 20 MG PO CPDR: 20.0000 mg | DELAYED_RELEASE_CAPSULE | Freq: Every day | ORAL | 3 refills | Status: DC

## 2022-11-02 MED ORDER — PREDNISONE 20 MG PO TABS: 40.0000 mg | ORAL_TABLET | Freq: Every day | ORAL | 0 refills | Status: AC

## 2022-11-02 NOTE — Assessment & Plan Note (Addendum)
Acute Symptoms consistent with asthma exacerbation, possibly from URI, possibly from GERD COVID test negative No signs of bacterial infection so we will hold off on antibiotic Continue inhalers-Breztri 2 puffs twice daily, albuterol as needed Prednisone 40 mg daily x 5 days Will also start omeprazole 20 mg daily He will update me on his symptoms

## 2022-11-02 NOTE — Assessment & Plan Note (Signed)
Some symptoms are concerning for GERD Denies any obvious GERD, but that could be contributing to some of his symptoms Start omeprazole 20 mg daily Will reevaluate at his next visit

## 2022-11-02 NOTE — Patient Instructions (Addendum)
        Medications changes include :   prednisone 40 mg x 5 days.  Omeprazole 20 mg daily - take 30 minutes prior to a meal.     Return if symptoms worsen or fail to improve.

## 2022-11-14 NOTE — Progress Notes (Signed)
Follow Up Note  RE: JERALD HENNINGTON MRN: [id number] DOB: June 03, 1981 Date of Office Visit: 11/15/2022  Referring provider: Binnie Rail, MD Primary care provider: Binnie Rail, MD  Chief Complaint: Follow-up, Asthma (Pt c/o still having vocal issues. ), and Cough  History of Present Illness: I had the pleasure of seeing Jian Hodgman for a follow up visit at the [facility] on 11/15/2022. He is a 42 y.o. male, who is being followed for cough variant asthma, allergic rhinitis and possible GERD. His previous allergy office visit was on 09/01/2022 with Dr. Maudie Mercury. Today is a regular follow up visit.  Cough variant asthma Patient's insurance won't cover Judithann Sauger but he was able to get an extra inhaler from his mother and has been using it 2 puffs BID which seems to be working better than Symbicort.  Using albuterol about twice per week due to coughing, wheezing and chest tightness which helps.  Denies any ER/urgent care visits. He did go to his PCP and was prescribed prednisone and omeprazole which is helping.  Coughing is better. His voice is still hoarse.  Allergic rhinitis Currently on Claritin daily. Patient used Ryaltris sample but it not was not covered. Sometimes has nasal congestion.   GERD Taking 20mg  once a day with some benefit.  Drinks soda.   11/02/2022 PCP visit: "Symptoms consistent with asthma exacerbation, possibly from URI, possibly from GERD COVID test negative No signs of bacterial infection so we will hold off on antibiotic Continue inhalers-Breztri 2 puffs twice daily, albuterol as needed Prednisone 40 mg daily x 5 days Will also start omeprazole 20 mg daily He will update me on his symptoms"  Assessment and Plan: Callin is a 42 y.o. male with: Cough variant asthma Past history - Diagnosed with asthma as a child however the last 3 weeks having a flare with coughing with posttussive emesis at times.  Had similar episode 8 months ago as  well.  Currently on Symbicort 160 2 puffs twice a day and using albuterol daily.  Symptoms seems to be worse at night.  Did a trial of PPI with no benefit.  No recent chest x-ray.  1 new dog the last year.  Takes methotrexate for dermatomyositis. 2023 spirometry was normal with 8% improvement in FEV1 postbronchodilator treatment.  Clinically feeling improved. Interim history - Doing better with Breztri but not covered. Had prednisone in January. Daily controller medication(s): start Trelegy 252mcg 1 puff once a day with spacer and rinse mouth afterwards. Sample given. Demonstrated proper use.  This replaces Breztri. Let me know if not covered.  May use albuterol rescue inhaler 2 puffs every 4 to 6 hours as needed for shortness of breath, chest tightness, coughing, and wheezing. May use albuterol rescue inhaler 2 puffs 5 to 15 minutes prior to strenuous physical activities. Monitor frequency of use.  No spirometry today as vitals showed elevated temp - denies fevers/chills Get spirometry at next visit.  Seasonal and perennial allergic rhinitis Past history - Noted some rhinitis symptoms mainly in the spring and takes Claritin with good benefit. 1 dog at home. 2023 skin testing showed: Positive to grass, ragweed, weed, trees, mold, cat, dog, cockroach, dust mites.  Interim history - Ryaltris not covered. Still taking Claritin.  Continue environmental control measures as below. Use over the counter antihistamines such as Zyrtec (cetirizine), Claritin (loratadine), Allegra (fexofenadine), or Xyzal (levocetirizine) daily as needed. May take twice a day during allergy flares. May switch antihistamines every few  months. Start Singulair (montelukast) 10mg  daily at night. Cautioned that in some children/adults can experience behavioral changes including hyperactivity, agitation, depression, sleep disturbances and suicidal ideations. These side effects are rare, but if you notice them you should notify me and  discontinue Singulair (montelukast). Start dymista (fluticasone + azelastine nasal spray combination) 1 spray per nostril twice a day. If it's not covered let us know.   Consider allergy injections for long term control if above medications do not help the symptoms.   Possible GERD Started on omeprazole 20mg  and thinks it's helping. Continue lifestyle and dietary modifications. Limit soda intake.  Continue omeprazole 20mg  once a day and nothing to eat or drink for 20-30 minutes afterwards.   Voice hoarseness Still present and unchanged. Refer to ENT to look at vocal cords.  Return in about 2 months (around 01/14/2023).  Meds ordered this encounter  Medications   Fluticasone-Umeclidin-Vilant (TRELEGY ELLIPTA) 200-62.5-25 MCG/ACT AEPB    Sig: Inhale 1 puff into the lungs daily. Rinse mouth after each use.    Dispense:  60 each    Refill:  3   Azelastine-Fluticasone 137-50 MCG/ACT SUSP    Sig: Place 1 spray into the nose in the morning and at bedtime.    Dispense:  23 g    Refill:  3   montelukast (SINGULAIR) 10 MG tablet    Sig: Take 1 tablet (10 mg total) by mouth at bedtime.    Dispense:  30 tablet    Refill:  3   Lab Orders  No laboratory test(s) ordered today    Diagnostics: None.  Medication List:  Current Outpatient Medications  Medication Sig Dispense Refill   albuterol (VENTOLIN HFA) 108 (90 Base) MCG/ACT inhaler Inhale 2 puffs into the lungs every 6 (six) hours as needed for wheezing or shortness of breath. 18 g 11   Azelastine-Fluticasone 137-50 MCG/ACT SUSP Place 1 spray into the nose in the morning and at bedtime. 23 g 3   Butenafine HCl 1 % cream Apply twice daily to affected area 30 g 0   COD LIVER OIL PO Take 1 capsule by mouth daily.     Creatine POWD Take 1 Scoop by mouth.     diltiazem (CARDIZEM CD) 360 MG 24 hr capsule Take 1 capsule (360 mg total) by mouth daily. 90 capsule 1   Fluticasone-Umeclidin-Vilant (TRELEGY ELLIPTA) 200-62.5-25 MCG/ACT AEPB  Inhale 1 puff into the lungs daily. Rinse mouth after each use. 60 each 3   hydrochlorothiazide (HYDRODIURIL) 25 MG tablet Take 1 tablet (25 mg total) by mouth daily. 90 tablet 3   hydrocortisone 2.5 % cream Apply topically.     levocetirizine (XYZAL) 5 MG tablet Take 1 tablet (5 mg total) by mouth every evening. 30 tablet 3   losartan (COZAAR) 100 MG tablet TAKE ONE TABLET BY MOUTH DAILY 90 tablet 1   meloxicam (MOBIC) 7.5 MG tablet Take by mouth with food daily or twice daily as needed 60 tablet 5   mometasone (ELOCON) 0.1 % cream Apply topically.     montelukast (SINGULAIR) 10 MG tablet Take 1 tablet (10 mg total) by mouth at bedtime. 30 tablet 3   Omega-3 Fatty Acids (FISH OIL) 1000 MG CAPS Take 1,000 mg by mouth daily.     omeprazole (PRILOSEC) 20 MG capsule Take 1 capsule (20 mg total) by mouth daily. 30 capsule 3   tacrolimus (PROTOPIC) 0.03 % ointment Apply 1 application topically 2 (two) times daily.     triamcinolone cream (KENALOG)  0.1 % Apply topically.     VITAMIN D PO Take by mouth daily.     No current facility-administered medications for this visit.   Allergies: Allergies  Allergen Reactions   Sulfonamide Derivatives     REACTION: SWELLING (in childhood)   Cephalexin     REACTION: vomiting  No rash or fever   Lisinopril     REACTION: cough   I reviewed his past medical history, social history, family history, and environmental history and no significant changes have been reported from his previous visit.  Review of Systems  Constitutional:  Negative for appetite change, chills, fever and unexpected weight change.  HENT:  Positive for congestion and voice change. Negative for rhinorrhea and sore throat.   Eyes:  Negative for itching.  Respiratory:  Positive for cough. Negative for chest tightness, shortness of breath and wheezing.   Cardiovascular:  Negative for chest pain.  Gastrointestinal:  Negative for abdominal pain.  Genitourinary:  Negative for difficulty  urinating.  Skin:  Negative for rash.  Allergic/Immunologic: Positive for environmental allergies. Negative for food allergies.  Neurological:  Negative for headaches.    Objective: BP (!) 130/90   Pulse 88   Temp 99.4 F (37.4 C) (Temporal)   Resp 16   Ht 5\' 7"  (1.702 m)   Wt 257 lb 4.8 oz (116.7 kg)   SpO2 97%   BMI 40.30 kg/m  Body mass index is 40.3 kg/m. Physical Exam Vitals and nursing note reviewed.  Constitutional:      Appearance: Normal appearance. He is well-developed.  HENT:     Head: Normocephalic and atraumatic.     Right Ear: Tympanic membrane and external ear normal.     Left Ear: Tympanic membrane and external ear normal.     Nose: Nose normal.     Mouth/Throat:     Mouth: Mucous membranes are moist.     Pharynx: Oropharynx is clear.  Eyes:     Conjunctiva/sclera: Conjunctivae normal.  Cardiovascular:     Rate and Rhythm: Normal rate and regular rhythm.     Heart sounds: Normal heart sounds. No murmur heard.    No friction rub. No gallop.  Pulmonary:     Effort: Pulmonary effort is normal.     Breath sounds: Normal breath sounds. No wheezing, rhonchi or rales.  Musculoskeletal:     Cervical back: Neck supple.  Skin:    General: Skin is warm and dry.     Comments: Skin discoloration noted. Scattered nodules on upper extremities - chronic calcinosis cutis.  Neurological:     Mental Status: He is alert and oriented to person, place, and time.  Psychiatric:        Behavior: Behavior normal.    Previous notes and tests were reviewed. The plan was reviewed with the patient/family, and all questions/concerned were addressed.  It was my pleasure to see Tionne today and participate in his care. Please feel free to contact me with any questions or concerns.  Sincerely,  Harriett Sine, DO Allergy & Immunology  [facility] office: [phone] [facility] office: [phone]

## 2022-11-15 ENCOUNTER — Encounter: Payer: Self-pay | Admitting: Allergy

## 2022-11-15 ENCOUNTER — Telehealth: Payer: Self-pay

## 2022-11-15 ENCOUNTER — Ambulatory Visit: Payer: Federal, State, Local not specified - PPO | Admitting: Allergy

## 2022-11-15 ENCOUNTER — Other Ambulatory Visit: Payer: Self-pay

## 2022-11-15 VITALS — BP 130/90 | HR 88 | Temp 99.4°F | Resp 16 | Ht 67.0 in | Wt 257.3 lb

## 2022-11-15 DIAGNOSIS — R490 Dysphonia: Secondary | ICD-10-CM

## 2022-11-15 DIAGNOSIS — J45991 Cough variant asthma: Secondary | ICD-10-CM

## 2022-11-15 DIAGNOSIS — K219 Gastro-esophageal reflux disease without esophagitis: Secondary | ICD-10-CM | POA: Diagnosis not present

## 2022-11-15 DIAGNOSIS — J3089 Other allergic rhinitis: Secondary | ICD-10-CM | POA: Diagnosis not present

## 2022-11-15 DIAGNOSIS — J302 Other seasonal allergic rhinitis: Secondary | ICD-10-CM

## 2022-11-15 MED ORDER — MONTELUKAST SODIUM 10 MG PO TABS: 10.0000 mg | ORAL_TABLET | Freq: Every day | ORAL | 3 refills | Status: DC

## 2022-11-15 MED ORDER — TRELEGY ELLIPTA 200-62.5-25 MCG/ACT IN AEPB: 1.0000 | INHALATION_SPRAY | Freq: Every day | RESPIRATORY_TRACT | 3 refills | Status: AC

## 2022-11-15 MED ORDER — AZELASTINE-FLUTICASONE 137-50 MCG/ACT NA SUSP: 1.0000 | Freq: Two times a day (BID) | NASAL | 3 refills | Status: AC

## 2022-11-15 NOTE — Patient Instructions (Addendum)
Coughing Daily controller medication(s): start Trelegy 1 puff once a day with spacer and rinse mouth afterwards. Sample given. This replaces Breztri. Let me know if not covered.  May use albuterol rescue inhaler 2 puffs every 4 to 6 hours as needed for shortness of breath, chest tightness, coughing, and wheezing. May use albuterol rescue inhaler 2 puffs 5 to 15 minutes prior to strenuous physical activities. Monitor frequency of use.  Asthma control goals:  Full participation in all desired activities (may need albuterol before activity) Albuterol use two times or less a week on average (not counting use with activity) Cough interfering with sleep two times or less a month Oral steroids no more than once a year No hospitalizations   Environmental allergies 2023 skin testing showed: Positive to grass, ragweed, weed, trees, mold, cat, dog, cockroach, dust mites.  Continue environmental control measures as below. Use over the counter antihistamines such as Zyrtec (cetirizine), Claritin (loratadine), Allegra (fexofenadine), or Xyzal (levocetirizine) daily as needed. May take twice a day during allergy flares. May switch antihistamines every few months. Start Singulair (montelukast) 10mg  daily at night. Cautioned that in some children/adults can experience behavioral changes including hyperactivity, agitation, depression, sleep disturbances and suicidal ideations. These side effects are rare, but if you notice them you should notify me and discontinue Singulair (montelukast). Start dymista (fluticasone + azelastine nasal spray combination) 1 spray per nostril twice a day. If it's not covered let know.   Consider allergy injections for long term control if above medications do not help the symptoms.   Heartburn: Continue lifestyle and dietary modifications. Limit soda intake.  Continue omeprazole 20mg  once a day and nothing to eat or drink for 20-30 minutes afterwards.   Voice  hoarseness: Refer to ENT for voice hoarseness.   Follow up in 2 months or sooner if needed.    Reducing Pollen Exposure Pollen seasons: trees (spring), grass (summer) and ragweed/weeds (fall). Keep windows closed in your home and car to lower pollen exposure.  Install air conditioning in the bedroom and throughout the house if possible.  Avoid going out in dry windy days - especially early morning. Pollen counts are highest between 5 - 10 AM and on dry, hot and windy days.  Save outside activities for late afternoon or after a heavy rain, when pollen levels are lower.  Avoid mowing of grass if you have grass pollen allergy. Be aware that pollen can also be transported indoors on people and pets.  Dry your clothes in an automatic dryer rather than hanging them outside where they might collect pollen.  Rinse hair and eyes before bedtime. Mold Control Mold and fungi can grow on a variety of surfaces provided certain temperature and moisture conditions exist.  Outdoor molds grow on plants, decaying vegetation and soil. The major outdoor mold, Alternaria and Cladosporium, are found in very high numbers during hot and dry conditions. Generally, a late summer - fall peak is seen for common outdoor fungal spores. Rain will temporarily lower outdoor mold spore count, but counts rise rapidly when the rainy period ends. The most important indoor molds are Aspergillus and Penicillium. Dark, humid and poorly ventilated basements are ideal sites for mold growth. The next most common sites of mold growth are the bathroom and the kitchen. Outdoor (Seasonal) Mold Control Use air conditioning and keep windows closed. Avoid exposure to decaying vegetation. Avoid leaf raking. Avoid grain handling. Consider wearing a face mask if working in moldy areas.  Indoor (Perennial) Mold Control  Maintain humidity below 50%. Get rid of mold growth on hard surfaces with water, detergent and, if necessary, 5% bleach (do  not mix with other cleaners). Then dry the area completely. If mold covers an area more than 10 square feet, consider hiring an indoor environmental professional. For clothing, washing with soap and water is best. If moldy items cannot be cleaned and dried, throw them away. Remove sources e.g. contaminated carpets. Repair and seal leaking roofs or pipes. Using dehumidifiers in damp basements may be helpful, but empty the water and clean units regularly to prevent mildew from forming. All rooms, especially basements, bathrooms and kitchens, require ventilation and cleaning to deter mold and mildew growth. Avoid carpeting on concrete or damp floors, and storing items in damp areas. Control of House Dust Mite Allergen Dust mite allergens are a common trigger of allergy and asthma symptoms. While they can be found throughout the house, these microscopic creatures thrive in warm, humid environments such as bedding, upholstered furniture and carpeting. Because so much time is spent in the bedroom, it is essential to reduce mite levels there.  Encase pillows, mattresses, and box springs in special allergen-proof fabric covers or airtight, zippered plastic covers.  Bedding should be washed weekly in hot water (130 F) and dried in a hot dryer. Allergen-proof covers are available for comforters and pillows that can't be regularly washed.  Wash the allergy-proof covers every few months. Minimize clutter in the bedroom. Keep pets out of the bedroom.  Keep humidity less than 50% by using a dehumidifier or air conditioning. You can buy a humidity measuring device called a hygrometer to monitor this.  If possible, replace carpets with hardwood, linoleum, or washable area rugs. If that's not possible, vacuum frequently with a vacuum that has a HEPA filter. Remove all upholstered furniture and non-washable window drapes from the bedroom. Remove all non-washable stuffed toys from the bedroom.  Wash stuffed toys  weekly. Pet Allergen Avoidance: Contrary to popular opinion, there are no "hypoallergenic" breeds of dogs or cats. That is because people are not allergic to an animal's hair, but to an allergen found in the animal's saliva, dander (dead skin flakes) or urine. Pet allergy symptoms typically occur within minutes. For some people, symptoms can build up and become most severe 8 to 12 hours after contact with the animal. People with severe allergies can experience reactions in public places if dander has been transported on the pet owners' clothing. Keeping an animal outdoors is only a partial solution, since homes with pets in the yard still have higher concentrations of animal allergens. Before getting a pet, ask your allergist to determine if you are allergic to animals. If your pet is already considered part of your family, try to minimize contact and keep the pet out of the bedroom and other rooms where you spend a great deal of time. As with dust mites, vacuum carpets often or replace carpet with a hardwood floor, tile or linoleum. High-efficiency particulate air (HEPA) cleaners can reduce allergen levels over time. While dander and saliva are the source of cat and dog allergens, urine is the source of allergens from rabbits, hamsters, mice and [place] pigs; so ask a non-allergic family member to clean the animal's cage. If you have a pet allergy, talk to your allergist about the potential for allergy immunotherapy (allergy shots). This strategy can often provide long-term relief. Cockroach Allergen Avoidance Cockroaches are often found in the homes of densely populated urban areas, schools or commercial buildings,  but these creatures can lurk almost anywhere. This does not mean that you have a dirty house or living area. Block all areas where roaches can enter the home. This includes crevices, wall cracks and windows.  Cockroaches need water to survive, so fix and seal all leaky faucets and pipes. Have  an exterminator go through the house when your family and pets are gone to eliminate any remaining roaches. Keep food in lidded containers and put pet food dishes away after your pets are done eating. Vacuum and sweep the floor after meals, and take out garbage and recyclables. Use lidded garbage containers in the kitchen. Wash dishes immediately after use and clean under stoves, refrigerators or toasters where crumbs can accumulate. Wipe off the stove and other kitchen surfaces and cupboards regularly.

## 2022-11-15 NOTE — Assessment & Plan Note (Signed)
Past history - Noted some rhinitis symptoms mainly in the spring and takes Claritin with good benefit. 1 dog at home. 2023 skin testing showed: Positive to grass, ragweed, weed, trees, mold, cat, dog, cockroach, dust mites.  Interim history - Ryaltris not covered. Still taking Claritin.  Continue environmental control measures as below. Use over the counter antihistamines such as Zyrtec (cetirizine), Claritin (loratadine), Allegra (fexofenadine), or Xyzal (levocetirizine) daily as needed. May take twice a day during allergy flares. May switch antihistamines every few months. Start Singulair (montelukast) 10mg  daily at night. Cautioned that in some children/adults can experience behavioral changes including hyperactivity, agitation, depression, sleep disturbances and suicidal ideations. These side effects are rare, but if you notice them you should notify me and discontinue Singulair (montelukast). Start dymista (fluticasone + azelastine nasal spray combination) 1 spray per nostril twice a day. If it's not covered let us know.   Consider allergy injections for long term control if above medications do not help the symptoms.

## 2022-11-15 NOTE — Telephone Encounter (Signed)
Per Dr. Maudie Mercury please send referral to ENT for hoarse voice.

## 2022-11-15 NOTE — Assessment & Plan Note (Signed)
Started on omeprazole 20mg  and thinks it's helping. Continue lifestyle and dietary modifications. Limit soda intake.  Continue omeprazole 20mg  once a day and nothing to eat or drink for 20-30 minutes afterwards.

## 2022-11-15 NOTE — Assessment & Plan Note (Signed)
Past history - Diagnosed with asthma as a child however the last 3 weeks having a flare with coughing with posttussive emesis at times.  Had similar episode 8 months ago as well.  Currently on Symbicort 160 2 puffs twice a day and using albuterol daily.  Symptoms seems to be worse at night.  Did a trial of PPI with no benefit.  No recent chest x-ray.  1 new dog the last year.  Takes methotrexate for dermatomyositis. 2023 spirometry was normal with 8% improvement in FEV1 postbronchodilator treatment.  Clinically feeling improved. Interim history - Doing better with Breztri but not covered. Had prednisone in January. Daily controller medication(s): start Trelegy 228mcg 1 puff once a day with spacer and rinse mouth afterwards. Sample given. Demonstrated proper use.  This replaces Breztri. Let me know if not covered.  May use albuterol rescue inhaler 2 puffs every 4 to 6 hours as needed for shortness of breath, chest tightness, coughing, and wheezing. May use albuterol rescue inhaler 2 puffs 5 to 15 minutes prior to strenuous physical activities. Monitor frequency of use.  No spirometry today as vitals showed elevated temp - denies fevers/chills Get spirometry at next visit.

## 2022-11-15 NOTE — Assessment & Plan Note (Signed)
Still present and unchanged. Refer to ENT to look at vocal cords.

## 2022-11-22 ENCOUNTER — Telehealth: Payer: Self-pay | Admitting: Allergy

## 2022-11-22 NOTE — Telephone Encounter (Signed)
PATIENT HAS BEEN REFERRED TO   [facility] [address] [place], [place] [address] (P) [phone]  I HAVE FAXED THE REFERRAL AND ALL CORRESPONDING NOTES TO THEIR OFFICE.  tHE[facility] WILL REACH OUT TO PATIENT TO SCHEDULE.

## 2023-01-16 NOTE — Progress Notes (Deleted)
Follow Up Note  RE: Evan Burns MRN: [id number] DOB: 1981/07/20 Date of Office Visit: 01/17/2023  Referring provider: Binnie Rail, MD Primary care provider: Binnie Rail, MD  Chief Complaint: No chief complaint on file.  History of Present Illness: I had the pleasure of seeing Evan Burns for a follow up visit at the [facility] on 01/16/2023. He is a 42 y.o. male, who is being followed for cough variant asthma, allergic rhinitis, possible GERD and voice hoarseness. His previous allergy office visit was on 11/15/2022 with Dr. Maudie Mercury. Today is a regular follow up visit.  Cough variant asthma Past history - Diagnosed with asthma as a child however the last 3 weeks having a flare with coughing with posttussive emesis at times.  Had similar episode 8 months ago as well.  Currently on Symbicort 160 2 puffs twice a day and using albuterol daily.  Symptoms seems to be worse at night.  Did a trial of PPI with no benefit.  No recent chest x-ray.  1 new dog the last year.  Takes methotrexate for dermatomyositis. 2023 spirometry was normal with 8% improvement in FEV1 postbronchodilator treatment.  Clinically feeling improved. Interim history - Doing better with Breztri but not covered. Had prednisone in January. Daily controller medication(s): start Trelegy 274mcg 1 puff once a day with spacer and rinse mouth afterwards. Sample given. Demonstrated proper use.  This replaces Breztri. Let me know if not covered.  May use albuterol rescue inhaler 2 puffs every 4 to 6 hours as needed for shortness of breath, chest tightness, coughing, and wheezing. May use albuterol rescue inhaler 2 puffs 5 to 15 minutes prior to strenuous physical activities. Monitor frequency of use.  No spirometry today as vitals showed elevated temp - denies fevers/chills Get spirometry at next visit.   Seasonal and perennial allergic rhinitis Past history - Noted some rhinitis symptoms mainly in the spring  and takes Claritin with good benefit. 1 dog at home. 2023 skin testing showed: Positive to grass, ragweed, weed, trees, mold, cat, dog, cockroach, dust mites.  Interim history - Ryaltris not covered. Still taking Claritin.  Continue environmental control measures as below. Use over the counter antihistamines such as Zyrtec (cetirizine), Claritin (loratadine), Allegra (fexofenadine), or Xyzal (levocetirizine) daily as needed. May take twice a day during allergy flares. May switch antihistamines every few months. Start Singulair (montelukast) 10mg  daily at night. Cautioned that in some children/adults can experience behavioral changes including hyperactivity, agitation, depression, sleep disturbances and suicidal ideations. These side effects are rare, but if you notice them you should notify me and discontinue Singulair (montelukast). Start dymista (fluticasone + azelastine nasal spray combination) 1 spray per nostril twice a day. If it's not covered let us know.   Consider allergy injections for long term control if above medications do not help the symptoms.    Possible GERD Started on omeprazole 20mg  and thinks it's helping. Continue lifestyle and dietary modifications. Limit soda intake.  Continue omeprazole 20mg  once a day and nothing to eat or drink for 20-30 minutes afterwards.    Voice hoarseness Still present and unchanged. Refer to ENT to look at vocal cords.   Return in about 2 months (around 01/14/2023).  Assessment and Plan: Evan Burns is a 42 y.o. male with: No problem-specific Assessment & Plan notes found for this encounter.  No follow-ups on file.  No orders of the defined types were placed in this encounter.  Lab Orders  No laboratory test(s)  ordered today    Diagnostics: Spirometry:  Tracings reviewed. His effort: {Blank single:19197::"Good reproducible efforts.","It was hard to get consistent efforts and there is a question as to whether this reflects a maximal  maneuver.","Poor effort, data can not be interpreted."} FVC: ***L FEV1: ***L, ***% predicted FEV1/FVC ratio: ***% Interpretation: {Blank single:19197::"Spirometry consistent with mild obstructive disease","Spirometry consistent with moderate obstructive disease","Spirometry consistent with severe obstructive disease","Spirometry consistent with possible restrictive disease","Spirometry consistent with mixed obstructive and restrictive disease","Spirometry uninterpretable due to technique","Spirometry consistent with normal pattern","No overt abnormalities noted given today's efforts"}.  Please see scanned spirometry results for details.  Skin Testing: {Blank single:19197::"Select foods","Environmental allergy panel","Environmental allergy panel and select foods","Food allergy panel","None","Deferred due to recent antihistamines use"}. *** Results discussed with patient/family.   Medication List:  Current Outpatient Medications  Medication Sig Dispense Refill  . albuterol (VENTOLIN HFA) 108 (90 Base) MCG/ACT inhaler Inhale 2 puffs into the lungs every 6 (six) hours as needed for wheezing or shortness of breath. 18 g 11  . Azelastine-Fluticasone 137-50 MCG/ACT SUSP Place 1 spray into the nose in the morning and at bedtime. 23 g 3  . Butenafine HCl 1 % cream Apply twice daily to affected area 30 g 0  . COD LIVER OIL PO Take 1 capsule by mouth daily.    . Creatine POWD Take 1 Scoop by mouth.    . diltiazem (CARDIZEM CD) 360 MG 24 hr capsule Take 1 capsule (360 mg total) by mouth daily. 90 capsule 1  . Fluticasone-Umeclidin-Vilant (TRELEGY ELLIPTA) 200-62.5-25 MCG/ACT AEPB Inhale 1 puff into the lungs daily. Rinse mouth after each use. 60 each 3  . hydrochlorothiazide (HYDRODIURIL) 25 MG tablet Take 1 tablet (25 mg total) by mouth daily. 90 tablet 3  . hydrocortisone 2.5 % cream Apply topically.    Marland Kitchen levocetirizine (XYZAL) 5 MG tablet Take 1 tablet (5 mg total) by mouth every evening. 30 tablet 3   . losartan (COZAAR) 100 MG tablet TAKE ONE TABLET BY MOUTH DAILY 90 tablet 1  . meloxicam (MOBIC) 7.5 MG tablet Take by mouth with food daily or twice daily as needed 60 tablet 5  . mometasone (ELOCON) 0.1 % cream Apply topically.    . montelukast (SINGULAIR) 10 MG tablet Take 1 tablet (10 mg total) by mouth at bedtime. 30 tablet 3  . Omega-3 Fatty Acids (FISH OIL) 1000 MG CAPS Take 1,000 mg by mouth daily.    Marland Kitchen omeprazole (PRILOSEC) 20 MG capsule Take 1 capsule (20 mg total) by mouth daily. 30 capsule 3  . tacrolimus (PROTOPIC) 0.03 % ointment Apply 1 application topically 2 (two) times daily.    Marland Kitchen triamcinolone cream (KENALOG) 0.1 % Apply topically.    Marland Kitchen VITAMIN D PO Take by mouth daily.     No current facility-administered medications for this visit.   Allergies: Allergies  Allergen Reactions  . Sulfonamide Derivatives     REACTION: SWELLING (in childhood)  . Cephalexin     REACTION: vomiting  No rash or fever  . Lisinopril     REACTION: cough   I reviewed his past medical history, social history, family history, and environmental history and no significant changes have been reported from his previous visit.  Review of Systems  Constitutional:  Negative for appetite change, chills, fever and unexpected weight change.  HENT:  Positive for congestion and voice change. Negative for rhinorrhea and sore throat.   Eyes:  Negative for itching.  Respiratory:  Positive for cough. Negative for chest tightness, shortness  of breath and wheezing.   Cardiovascular:  Negative for chest pain.  Gastrointestinal:  Negative for abdominal pain.  Genitourinary:  Negative for difficulty urinating.  Skin:  Negative for rash.  Allergic/Immunologic: Positive for environmental allergies. Negative for food allergies.  Neurological:  Negative for headaches.   Objective: There were no vitals taken for this visit. There is no height or weight on file to calculate BMI. Physical Exam Vitals and  nursing note reviewed.  Constitutional:      Appearance: Normal appearance. He is well-developed.  HENT:     Head: Normocephalic and atraumatic.     Right Ear: Tympanic membrane and external ear normal.     Left Ear: Tympanic membrane and external ear normal.     Nose: Nose normal.     Mouth/Throat:     Mouth: Mucous membranes are moist.     Pharynx: Oropharynx is clear.  Eyes:     Conjunctiva/sclera: Conjunctivae normal.  Cardiovascular:     Rate and Rhythm: Normal rate and regular rhythm.     Heart sounds: Normal heart sounds. No murmur heard.    No friction rub. No gallop.  Pulmonary:     Effort: Pulmonary effort is normal.     Breath sounds: Normal breath sounds. No wheezing, rhonchi or rales.  Musculoskeletal:     Cervical back: Neck supple.  Skin:    General: Skin is warm and dry.     Comments: Skin discoloration noted. Scattered nodules on upper extremities - chronic calcinosis cutis.  Neurological:     Mental Status: He is alert and oriented to person, place, and time.  Psychiatric:        Behavior: Behavior normal.  Previous notes and tests were reviewed. The plan was reviewed with the patient/family, and all questions/concerned were addressed.  It was my pleasure to see Evan Burns today and participate in his care. Please feel free to contact me with any questions or concerns.  Sincerely,  Rexene Alberts, DO Allergy & Immunology  [facility] office: [place] office: [phone]

## 2023-01-17 ENCOUNTER — Ambulatory Visit: Payer: Federal, State, Local not specified - PPO | Admitting: Allergy

## 2023-01-17 DIAGNOSIS — J302 Other seasonal allergic rhinitis: Secondary | ICD-10-CM

## 2023-01-17 DIAGNOSIS — K219 Gastro-esophageal reflux disease without esophagitis: Secondary | ICD-10-CM

## 2023-01-17 DIAGNOSIS — J309 Allergic rhinitis, unspecified: Secondary | ICD-10-CM

## 2023-01-17 DIAGNOSIS — R490 Dysphonia: Secondary | ICD-10-CM

## 2023-01-17 DIAGNOSIS — J45991 Cough variant asthma: Secondary | ICD-10-CM

## 2023-02-22 DIAGNOSIS — L942 Calcinosis cutis: Secondary | ICD-10-CM | POA: Diagnosis not present

## 2023-02-22 DIAGNOSIS — D23121 Other benign neoplasm of skin of left upper eyelid, including canthus: Secondary | ICD-10-CM | POA: Diagnosis not present

## 2023-03-13 NOTE — Progress Notes (Deleted)
Office Visit Note  Patient: Evan Burns             Date of Birth: February 21, 1981           MRN: [id number]             PCP: [name], MD Referring: [name], MD Visit Date: 03/14/2023   Subjective:  No chief complaint on file.   History of Present Illness: Evan Burns is a 42 y.o. male here for follow up ***   Previous HPI 09/13/22 Evan Burns is a 42 y.o. 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  Currently has some right hip pain in the groin and down to the medial knee has not bothered him a bit more than usual for about 2 days.  Intermittently has worsening pain in this distribution.  The meloxicam is pretty helpful but is usually taking less than once daily on average.  Skin rashes have remained pretty stable and he has not noticed specific new nodules.  Still having some changes at the lateral foot.  He has noticed a couple areas getting sensitive or having raised or irritated bumps at the right elbow nodules but no drainage or redness.   Previous HPI 06/14/2022 Evan Burns is a 43 y.o. male here for follow up for dermatomyositis with chronic calcinosis cutis currently on methotrexate 20 mg p.o. weekly folic acid 1 mg daily and is also on meloxicam 15 mg p.o. daily for right hip and knee joint pain.  Since starting the meloxicam he did notice improvement with his right hip pain actually feels like this was doing better and having more trouble of the knee than in the hip.  More recently had an episode where he he developed some left ankle pain and swelling but this resolved within a few weeks and not having a problem at the moment.  He had 1 episode of calcified nodule in the right elbow broke open wound was draining for about 3 days but this healed without any specific intervention.  Apparently his wife has noticed some increasing changes on the lateral side of  his left foot but he does not notice any symptoms there.  Otherwise skin rash and nodules are about the same.   Previous HPI 03/09/2022 Evan Burns is a 42 y.o. male here for follow up for dermatomyositis with chronic calcinosis cutis on methotrexate 15 mg PO weekly and folic acid 1 mg daily. He is tolerating methotrexate without any major problems. He has developed increased skin peeling and itching on his face and neck. He has persistent right hip and knee pain and has some stiffness worst first thing in the morning. He notices upper leg weakness about 2 days out of the week.   Previous HPI 11/18/2021 Evan Burns is a 42 y.o. male here for follow up for dermatomyositis with chronic calcinosis cutis on methotrexate 15 mg PO weekly and folic acid 1 mg daily. He continues to have right hip pain also having right knee pain. Notices some fatigue when taking the methotrexate, no other intolerance or side effect notices. He saw Dr. Frazier Butt for treatment of right elbow abscess that has healed well no ongoing pain or inflammation and off antibiotics. No other infections or major events.   Previous HPI 08/24/21 Evan Burns is a 42 y.o. male here for follow up for dermatomyositis with chronic  calcinosis cutis on methotrexate 15 mg PO weekly since about 3 months ago. He has not noticed any difference since our last visit. On further discission he was apparently taking only 1 tablet weekly of the methotrexate so far below recommended dose. He had some new skin peeling over his face increased since this summer and saw dermatology for this multiple times. They tried a few topical medication without response but now using tacrolimus with improvement.   Previous HPI 10/02/2020 History of Present Illness: Evan Burns is a 42 y.o. male with a history of dermatomyositis here for evaluation of myalgias and skin problems. He has a long history of DM diagnosed around 20 years ago originally with muscle  and skin disease activity. He was treated with steroids for this with apparent resolution of his muscle inflammation. Since that time he has had continued development of calcifications that sometimes become swollen or infected but no known recurrence of muscular involvement. He takes treatments for asthma but no known ILD. Today he complains of joint pain in multiple sites the right groin, left ankle, and right shoulder. He has had previous imaging of the shoulder and hip showing right shoulder bursitis and moderately advanced OA of the right hip. He has not had imaging for the left ankle pain. He does see swelling around the left ankle sometimes and worse after working all day. He has not noticed any focal weakness except around his right hip and thigh. He denies cough, dyspnea, fevers, weight loss, or lymphadenopathy.   No Rheumatology ROS completed.   PMFS History:  Patient Active Problem List   Diagnosis Date Noted   Voice hoarseness 11/15/2022   Asthma exacerbation 11/02/2022   Occipital lymphadenopathy 09/28/2022   Cough variant asthma 09/01/2022   Seasonal and perennial allergic rhinitis 09/01/2022   Asthmatic bronchitis 08/23/2022   Facial rash 03/09/2022   COVID-19 01/08/2022   Abscess of left elbow 10/01/2021   High risk medication use 06/01/2021   Vitamin D deficiency 03/19/2021   Lower leg edema 01/09/2021   Pain in left ankle and joints of left foot 10/02/2020   Calcinosis cutis 10/02/2020   Arthritis of right hip 09/16/2020   Tinea cruris 07/28/2020   Leg pain, bilateral 07/28/2020   Prediabetes 03/19/2020   Chest mass 02/14/2019   Other chest pain 02/14/2019   Superficial phlebitis, chest wall 02/14/2019   Fatigue 11/22/2018   Cough 11/08/2015   Possible GERD 10/29/2010   Essential hypertension 10/12/2010   Dermatomyositis (HCC) 06/17/2008   Asthma 02/22/2007    Past Medical History:  Diagnosis Date   Arthritis of right hip 09/16/2020   X-ray October 2021    Asthma    Dermatomyositis (HCC)    Hypertension     Family History  Problem Relation Age of Onset   Asthma Mother    Hypertension Mother    Diabetes Mother    Kidney disease Mother    Hyperlipidemia Mother    Arthritis Mother    Hypertension Father    Hyperlipidemia Father    Past Surgical History:  Procedure Laterality Date   HAND SURGERY     INCISION AND DRAINAGE OF WOUND     MASS EXCISION Left 07/27/2019   Procedure: LEFT LONG FINGER NODULE EXCISION;  Surgeon: Tarry Kos, MD;  Location: [facility];  Service: Orthopedics;  Laterality: Left;   TYMPANOSTOMY TUBE PLACEMENT     Social History   Social History Narrative   Pt lives at home w/ parents  Very active at work - works two jobs   Immunization History  Administered Date(s) Administered   Influenza Split 08/25/2011, 07/25/2012   Influenza Whole 08/08/2007, 08/14/2008, 07/23/2009, 07/21/2010   Influenza,inj,Quad PF,6+ Mos 07/24/2013, 07/23/2014, 07/01/2015, 07/05/2016, 08/06/2017, 08/04/2018, 09/07/2019, 07/28/2020, 07/03/2021, 07/26/2022   PFIZER Comirnaty(Gray Top)Covid-19 Tri-Sucrose Vaccine 08/06/2022   PFIZER(Purple Top)SARS-COV-2 Vaccination 01/07/2020, 01/28/2020, 09/22/2020   Pfizer Covid-19 Vaccine Bivalent Booster 84yrs & up 08/04/2021   Pneumococcal Conjugate-13 09/21/2021   Pneumococcal Polysaccharide-23 10/01/2014   Tdap 09/13/2013, 04/18/2019     Objective: Vital Signs: There were no vitals taken for this visit.   Physical Exam   Musculoskeletal Exam: ***  CDAI Exam: CDAI Score: -- Patient Global: --; Provider Global: -- Swollen: --; Tender: -- Joint Exam 03/14/2023   No joint exam has been documented for this visit   There is currently no information documented on the homunculus. Go to the Rheumatology activity and complete the homunculus joint exam.  Investigation: No additional findings.  Imaging: No results found.  Recent Labs: Lab Results  Component Value  Date   WBC 5.3 09/28/2022   HGB 13.9 09/28/2022   PLT 238.0 09/28/2022   NA 142 09/28/2022   K 4.3 09/28/2022   CL 106 09/28/2022   CO2 31 09/28/2022   GLUCOSE 94 09/28/2022   BUN 16 09/28/2022   CREATININE 1.20 09/28/2022   BILITOT 0.5 09/28/2022   ALKPHOS 46 09/28/2022   AST 24 09/28/2022   ALT 28 09/28/2022   PROT 6.7 09/28/2022   ALBUMIN 4.3 09/28/2022   CALCIUM 9.0 09/28/2022   GFRAA  02/01/2010    >60        The eGFR has been calculated using the MDRD equation. This calculation has not been validated in all clinical situations. eGFR's persistently <60 mL/min signify possible Chronic Kidney Disease.    Speciality Comments: No specialty comments available.  Procedures:  No procedures performed Allergies: Sulfonamide derivatives, Cephalexin, and Lisinopril   Assessment / Plan:     Visit Diagnoses: No diagnosis found.  ***  Orders: No orders of the defined types were placed in this encounter.  No orders of the defined types were placed in this encounter.    Follow-Up Instructions: No follow-ups on file.   Fuller Plan, MD  Note - This record has been created using [employer].  Chart creation errors have been sought, but may not always  have been located. Such creation errors do not reflect on  the standard of medical care.

## 2023-03-14 ENCOUNTER — Ambulatory Visit: Payer: Federal, State, Local not specified - PPO | Admitting: Internal Medicine

## 2023-03-28 ENCOUNTER — Ambulatory Visit: Payer: Federal, State, Local not specified - PPO | Admitting: Internal Medicine

## 2023-04-04 ENCOUNTER — Encounter: Payer: Federal, State, Local not specified - PPO | Admitting: Internal Medicine

## 2023-04-11 ENCOUNTER — Encounter: Payer: Federal, State, Local not specified - PPO | Admitting: Internal Medicine

## 2023-04-11 ENCOUNTER — Ambulatory Visit: Payer: Federal, State, Local not specified - PPO | Admitting: Internal Medicine

## 2023-04-11 NOTE — Progress Notes (Deleted)
Office Visit Note  Patient: Evan Burns             Date of Birth: Jun 05, 1981           MRN: [id number]             PCP: [name], MD Referring: [name], MD Visit Date: 04/11/2023   Subjective:  No chief complaint on file.   History of Present Illness: Evan Burns is a 42 y.o. male here for follow up ***   Previous HPI 09/13/22 Evan Burns is a 42 y.o. 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  Currently has some right hip pain in the groin and down to the medial knee has not bothered him a bit more than usual for about 2 days.  Intermittently has worsening pain in this distribution.  The meloxicam is pretty helpful but is usually taking less than once daily on average.  Skin rashes have remained pretty stable and he has not noticed specific new nodules.  Still having some changes at the lateral foot.  He has noticed a couple areas getting sensitive or having raised or irritated bumps at the right elbow nodules but no drainage or redness.   Previous HPI 06/14/2022 Evan Burns is a 42 y.o. male here for follow up for dermatomyositis with chronic calcinosis cutis currently on methotrexate 20 mg p.o. weekly folic acid 1 mg daily and is also on meloxicam 15 mg p.o. daily for right hip and knee joint pain.  Since starting the meloxicam he did notice improvement with his right hip pain actually feels like this was doing better and having more trouble of the knee than in the hip.  More recently had an episode where he he developed some left ankle pain and swelling but this resolved within a few weeks and not having a problem at the moment.  He had 1 episode of calcified nodule in the right elbow broke open wound was draining for about 3 days but this healed without any specific intervention.  Apparently his wife has noticed some increasing changes on the lateral side of  his left foot but he does not notice any symptoms there.  Otherwise skin rash and nodules are about the same.   Previous HPI 03/09/2022 Evan Burns is a 42 y.o. male here for follow up for dermatomyositis with chronic calcinosis cutis on methotrexate 15 mg PO weekly and folic acid 1 mg daily. He is tolerating methotrexate without any major problems. He has developed increased skin peeling and itching on his face and neck. He has persistent right hip and knee pain and has some stiffness worst first thing in the morning. He notices upper leg weakness about 2 days out of the week.   Previous HPI 11/18/2021 Evan Burns is a 42 y.o. male here for follow up for dermatomyositis with chronic calcinosis cutis on methotrexate 15 mg PO weekly and folic acid 1 mg daily. He continues to have right hip pain also having right knee pain. Notices some fatigue when taking the methotrexate, no other intolerance or side effect notices. He saw Dr. Frazier Butt for treatment of right elbow abscess that has healed well no ongoing pain or inflammation and off antibiotics. No other infections or major events.   Previous HPI 08/24/21 Evan Burns is a 42 y.o. male here for follow up for dermatomyositis with chronic  calcinosis cutis on methotrexate 15 mg PO weekly since about 3 months ago. He has not noticed any difference since our last visit. On further discission he was apparently taking only 1 tablet weekly of the methotrexate so far below recommended dose. He had some new skin peeling over his face increased since this summer and saw dermatology for this multiple times. They tried a few topical medication without response but now using tacrolimus with improvement.   Previous HPI 10/02/2020 History of Present Illness: Evan Burns is a 42 y.o. male with a history of dermatomyositis here for evaluation of myalgias and skin problems. He has a long history of DM diagnosed around 20 years ago originally with muscle  and skin disease activity. He was treated with steroids for this with apparent resolution of his muscle inflammation. Since that time he has had continued development of calcifications that sometimes become swollen or infected but no known recurrence of muscular involvement. He takes treatments for asthma but no known ILD. Today he complains of joint pain in multiple sites the right groin, left ankle, and right shoulder. He has had previous imaging of the shoulder and hip showing right shoulder bursitis and moderately advanced OA of the right hip. He has not had imaging for the left ankle pain. He does see swelling around the left ankle sometimes and worse after working all day. He has not noticed any focal weakness except around his right hip and thigh. He denies cough, dyspnea, fevers, weight loss, or lymphadenopathy.   No Rheumatology ROS completed.   PMFS History:  Patient Active Problem List   Diagnosis Date Noted   Voice hoarseness 11/15/2022   Asthma exacerbation 11/02/2022   Occipital lymphadenopathy 09/28/2022   Cough variant asthma 09/01/2022   Seasonal and perennial allergic rhinitis 09/01/2022   Asthmatic bronchitis 08/23/2022   Facial rash 03/09/2022   COVID-19 01/08/2022   Abscess of left elbow 10/01/2021   High risk medication use 06/01/2021   Vitamin D deficiency 03/19/2021   Lower leg edema 01/09/2021   Pain in left ankle and joints of left foot 10/02/2020   Calcinosis cutis 10/02/2020   Arthritis of right hip 09/16/2020   Tinea cruris 07/28/2020   Leg pain, bilateral 07/28/2020   Prediabetes 03/19/2020   Chest mass 02/14/2019   Other chest pain 02/14/2019   Superficial phlebitis, chest wall 02/14/2019   Fatigue 11/22/2018   Cough 11/08/2015   Possible GERD 10/29/2010   Essential hypertension 10/12/2010   Dermatomyositis (HCC) 06/17/2008   Asthma 02/22/2007    Past Medical History:  Diagnosis Date   Arthritis of right hip 09/16/2020   X-ray October 2021    Asthma    Dermatomyositis (HCC)    Hypertension     Family History  Problem Relation Age of Onset   Asthma Mother    Hypertension Mother    Diabetes Mother    Kidney disease Mother    Hyperlipidemia Mother    Arthritis Mother    Hypertension Father    Hyperlipidemia Father    Past Surgical History:  Procedure Laterality Date   HAND SURGERY     INCISION AND DRAINAGE OF WOUND     MASS EXCISION Left 07/27/2019   Procedure: LEFT LONG FINGER NODULE EXCISION;  Surgeon: Tarry Kos, MD;  Location: [facility];  Service: Orthopedics;  Laterality: Left;   TYMPANOSTOMY TUBE PLACEMENT     Social History   Social History Narrative   Pt lives at home w/ parents  Very active at work - works two jobs   Immunization History  Administered Date(s) Administered   Influenza Split 08/25/2011, 07/25/2012   Influenza Whole 08/08/2007, 08/14/2008, 07/23/2009, 07/21/2010   Influenza,inj,Quad PF,6+ Mos 07/24/2013, 07/23/2014, 07/01/2015, 07/05/2016, 08/06/2017, 08/04/2018, 09/07/2019, 07/28/2020, 07/03/2021, 07/26/2022   PFIZER Comirnaty(Gray Top)Covid-19 Tri-Sucrose Vaccine 08/06/2022   PFIZER(Purple Top)SARS-COV-2 Vaccination 01/07/2020, 01/28/2020, 09/22/2020   Pfizer Covid-19 Vaccine Bivalent Booster 47yrs & up 08/04/2021   Pneumococcal Conjugate-13 09/21/2021   Pneumococcal Polysaccharide-23 10/01/2014   Tdap 09/13/2013, 04/18/2019     Objective: Vital Signs: There were no vitals taken for this visit.   Physical Exam   Musculoskeletal Exam: ***  CDAI Exam: CDAI Score: -- Patient Global: --; Provider Global: -- Swollen: --; Tender: -- Joint Exam 04/11/2023   No joint exam has been documented for this visit   There is currently no information documented on the homunculus. Go to the Rheumatology activity and complete the homunculus joint exam.  Investigation: No additional findings.  Imaging: No results found.  Recent Labs: Lab Results  Component Value  Date   WBC 5.3 09/28/2022   HGB 13.9 09/28/2022   PLT 238.0 09/28/2022   NA 142 09/28/2022   K 4.3 09/28/2022   CL 106 09/28/2022   CO2 31 09/28/2022   GLUCOSE 94 09/28/2022   BUN 16 09/28/2022   CREATININE 1.20 09/28/2022   BILITOT 0.5 09/28/2022   ALKPHOS 46 09/28/2022   AST 24 09/28/2022   ALT 28 09/28/2022   PROT 6.7 09/28/2022   ALBUMIN 4.3 09/28/2022   CALCIUM 9.0 09/28/2022   GFRAA  02/01/2010    >60        The eGFR has been calculated using the MDRD equation. This calculation has not been validated in all clinical situations. eGFR's persistently <60 mL/min signify possible Chronic Kidney Disease.    Speciality Comments: No specialty comments available.  Procedures:  No procedures performed Allergies: Sulfonamide derivatives, Cephalexin, and Lisinopril   Assessment / Plan:     Visit Diagnoses: No diagnosis found.  ***  Orders: No orders of the defined types were placed in this encounter.  No orders of the defined types were placed in this encounter.    Follow-Up Instructions: No follow-ups on file.   Fuller Plan, MD  Note - This record has been created using [employer].  Chart creation errors have been sought, but may not always  have been located. Such creation errors do not reflect on  the standard of medical care.

## 2023-05-12 NOTE — Progress Notes (Deleted)
Office Visit Note  Patient: Evan Burns             Date of Birth: 02-Apr-1981           MRN: [id number]             PCP: [name], MD Referring: [name], MD Visit Date: 05/16/2023   Subjective:  No chief complaint on file.   History of Present Illness: Evan Burns is a 42 y.o. 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    Previous HPI 09/13/2022 Evan Burns is a 42 y.o. 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  Currently has some right hip pain in the groin and down to the medial knee has not bothered him a bit more than usual for about 2 days.  Intermittently has worsening pain in this distribution.  The meloxicam is pretty helpful but is usually taking less than once daily on average.  Skin rashes have remained pretty stable and he has not noticed specific new nodules.  Still having some changes at the lateral foot.  He has noticed a couple areas getting sensitive or having raised or irritated bumps at the right elbow nodules but no drainage or redness.   Previous HPI 06/14/2022 Evan Burns is a 42 y.o. male here for follow up for dermatomyositis with chronic calcinosis cutis currently on methotrexate 20 mg p.o. weekly folic acid 1 mg daily and is also on meloxicam 15 mg p.o. daily for right hip and knee joint pain.  Since starting the meloxicam he did notice improvement with his right hip pain actually feels like this was doing better and having more trouble of the knee than in the hip.  More recently had an episode where he he developed some left ankle pain and swelling but this resolved within a few weeks and not having a problem at the moment.  He had 1 episode of calcified nodule in the right elbow broke open wound was draining for about 3 days but this healed  without any specific intervention.  Apparently his wife has noticed some increasing changes on the lateral side of his left foot but he does not notice any symptoms there.  Otherwise skin rash and nodules are about the same.   Previous HPI 03/09/2022 Evan Burns is a 42 y.o. male here for follow up for dermatomyositis with chronic calcinosis cutis on methotrexate 15 mg PO weekly and folic acid 1 mg daily. He is tolerating methotrexate without any major problems. He has developed increased skin peeling and itching on his face and neck. He has persistent right hip and knee pain and has some stiffness worst first thing in the morning. He notices upper leg weakness about 2 days out of the week.   Previous HPI 11/18/2021 Evan Burns is a 42 y.o. male here for follow up for dermatomyositis with chronic calcinosis cutis on methotrexate 15 mg PO weekly and folic acid 1 mg daily. He continues to have right hip pain also having right knee pain. Notices some fatigue when taking the methotrexate, no other intolerance or side effect notices. He saw Dr. Frazier Butt for treatment of right elbow abscess that has healed well no ongoing pain or inflammation and off antibiotics. No other infections or major events.   Previous HPI  08/24/21 Evan Burns is a 42 y.o. male here for follow up for dermatomyositis with chronic calcinosis cutis on methotrexate 15 mg PO weekly since about 3 months ago. He has not noticed any difference since our last visit. On further discission he was apparently taking only 1 tablet weekly of the methotrexate so far below recommended dose. He had some new skin peeling over his face increased since this summer and saw dermatology for this multiple times. They tried a few topical medication without response but now using tacrolimus with improvement.   Previous HPI 10/02/2020 History of Present Illness: Evan Burns is a 42 y.o. male with a history of dermatomyositis here for  evaluation of myalgias and skin problems. He has a long history of DM diagnosed around 20 years ago originally with muscle and skin disease activity. He was treated with steroids for this with apparent resolution of his muscle inflammation. Since that time he has had continued development of calcifications that sometimes become swollen or infected but no known recurrence of muscular involvement. He takes treatments for asthma but no known ILD. Today he complains of joint pain in multiple sites the right groin, left ankle, and right shoulder. He has had previous imaging of the shoulder and hip showing right shoulder bursitis and moderately advanced OA of the right hip. He has not had imaging for the left ankle pain. He does see swelling around the left ankle sometimes and worse after working all day. He has not noticed any focal weakness except around his right hip and thigh. He denies cough, dyspnea, fevers, weight loss, or lymphadenopathy   No Rheumatology ROS completed.   PMFS History:  Patient Active Problem List   Diagnosis Date Noted   Voice hoarseness 11/15/2022   Asthma exacerbation 11/02/2022   Occipital lymphadenopathy 09/28/2022   Cough variant asthma 09/01/2022   Seasonal and perennial allergic rhinitis 09/01/2022   Asthmatic bronchitis 08/23/2022   Facial rash 03/09/2022   COVID-19 01/08/2022   Abscess of left elbow 10/01/2021   High risk medication use 06/01/2021   Vitamin D deficiency 03/19/2021   Lower leg edema 01/09/2021   Pain in left ankle and joints of left foot 10/02/2020   Calcinosis cutis 10/02/2020   Arthritis of right hip 09/16/2020   Tinea cruris 07/28/2020   Leg pain, bilateral 07/28/2020   Prediabetes 03/19/2020   Chest mass 02/14/2019   Other chest pain 02/14/2019   Superficial phlebitis, chest wall 02/14/2019   Fatigue 11/22/2018   Cough 11/08/2015   Possible GERD 10/29/2010   Essential hypertension 10/12/2010   Dermatomyositis (HCC) 06/17/2008   Asthma  02/22/2007    Past Medical History:  Diagnosis Date   Arthritis of right hip 09/16/2020   X-ray October 2021   Asthma    Dermatomyositis (HCC)    Hypertension     Family History  Problem Relation Age of Onset   Asthma Mother    Hypertension Mother    Diabetes Mother    Kidney disease Mother    Hyperlipidemia Mother    Arthritis Mother    Hypertension Father    Hyperlipidemia Father    Past Surgical History:  Procedure Laterality Date   HAND SURGERY     INCISION AND DRAINAGE OF WOUND     MASS EXCISION Left 07/27/2019   Procedure: LEFT LONG FINGER NODULE EXCISION;  Surgeon: Tarry Kos, MD;  Location: [facility];  Service: Orthopedics;  Laterality: Left;   TYMPANOSTOMY TUBE PLACEMENT  Social History   Social History Narrative   Pt lives at home w/ parents       Very active at work - works two jobs   Immunization History  Administered Date(s) Administered   Influenza Split 08/25/2011, 07/25/2012   Influenza Whole 08/08/2007, 08/14/2008, 07/23/2009, 07/21/2010   Influenza,inj,Quad PF,6+ Mos 07/24/2013, 07/23/2014, 07/01/2015, 07/05/2016, 08/06/2017, 08/04/2018, 09/07/2019, 07/28/2020, 07/03/2021, 07/26/2022   PFIZER Comirnaty(Gray Top)Covid-19 Tri-Sucrose Vaccine 08/06/2022   PFIZER(Purple Top)SARS-COV-2 Vaccination 01/07/2020, 01/28/2020, 09/22/2020   Pfizer Covid-19 Vaccine Bivalent Booster 83yrs & up 08/04/2021   Pneumococcal Conjugate-13 09/21/2021   Pneumococcal Polysaccharide-23 10/01/2014   Tdap 09/13/2013, 04/18/2019     Objective: Vital Signs: There were no vitals taken for this visit.   Physical Exam   Musculoskeletal Exam: ***  CDAI Exam: CDAI Score: -- Patient Global: --; Provider Global: -- Swollen: --; Tender: -- Joint Exam 05/16/2023   No joint exam has been documented for this visit   There is currently no information documented on the homunculus. Go to the Rheumatology activity and complete the homunculus joint  exam.  Investigation: No additional findings.  Imaging: No results found.  Recent Labs: Lab Results  Component Value Date   WBC 5.3 09/28/2022   HGB 13.9 09/28/2022   PLT 238.0 09/28/2022   NA 142 09/28/2022   K 4.3 09/28/2022   CL 106 09/28/2022   CO2 31 09/28/2022   GLUCOSE 94 09/28/2022   BUN 16 09/28/2022   CREATININE 1.20 09/28/2022   BILITOT 0.5 09/28/2022   ALKPHOS 46 09/28/2022   AST 24 09/28/2022   ALT 28 09/28/2022   PROT 6.7 09/28/2022   ALBUMIN 4.3 09/28/2022   CALCIUM 9.0 09/28/2022   GFRAA  02/01/2010    >60        The eGFR has been calculated using the MDRD equation. This calculation has not been validated in all clinical situations. eGFR's persistently <60 mL/min signify possible Chronic Kidney Disease.    Speciality Comments: No specialty comments available.  Procedures:  No procedures performed Allergies: Sulfonamide derivatives, Cephalexin, and Lisinopril   Assessment / Plan:     Visit Diagnoses: No diagnosis found.  ***  Orders: No orders of the defined types were placed in this encounter.  No orders of the defined types were placed in this encounter.    Follow-Up Instructions: No follow-ups on file.   Metta Clines, RT  Note - This record has been created using [employer].  Chart creation errors have been sought, but may not always  have been located. Such creation errors do not reflect on  the standard of medical care.

## 2023-05-16 ENCOUNTER — Ambulatory Visit: Payer: Federal, State, Local not specified - PPO | Admitting: Internal Medicine

## 2023-05-16 DIAGNOSIS — Z79899 Other long term (current) drug therapy: Secondary | ICD-10-CM

## 2023-05-16 DIAGNOSIS — M3313 Other dermatomyositis without myopathy: Secondary | ICD-10-CM

## 2023-05-16 DIAGNOSIS — M1611 Unilateral primary osteoarthritis, right hip: Secondary | ICD-10-CM

## 2023-05-24 NOTE — Progress Notes (Signed)
Office Visit Note  Patient: Evan Burns             Date of Birth: [birth year]           MRN: [id number]             PCP: [name], MD Referring: [name], MD Visit Date: 05/30/2023   Subjective:  Follow-up   History of Present Illness: Evan Burns is a 42 y.o. 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  After last visit recommended evaluation at [facility] dermatology with Dr. Oscar La but apparently appointment was never scheduled.  He is continue taking the meloxicam 7.5 mg with a pretty good benefit.  Still has right hip pain worse while weightbearing not severely limiting his activities.  He was seen for nodule swelling around the right eyelid had a biopsy for this in May consistent with calcinosis cutis and no recurrence since removal.  He is not having any pain with his other nodules unless he triggers this such as getting too much calcium.  More recently has complaint with right knee pain that has been acting up just in the past few weeks did not recall any preceding injury activity change and there is no associated swelling.  Previous HPI 09/13/2022 Evan Burns is a 42 y.o. 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  Currently has some right hip pain in the groin and down to the medial knee has not bothered him a bit more than usual for about 2 days.  Intermittently has worsening pain in this distribution.  The meloxicam is pretty helpful but is usually taking less than once daily on average.  Skin rashes have remained pretty stable and he has not noticed specific new nodules.  Still having some changes at the lateral foot.  He has noticed a couple areas getting sensitive or having raised or irritated bumps at the right elbow nodules but no drainage or redness.    Previous HPI 06/14/2022 Evan Burns is a 42 y.o. male here for follow up for dermatomyositis with chronic calcinosis cutis currently on methotrexate 20 mg p.o. weekly folic acid 1 mg daily and is also on meloxicam 15 mg p.o. daily for right hip and knee joint pain.  Since starting the meloxicam he did notice improvement with his right hip pain actually feels like this was doing better and having more trouble of the knee than in the hip.  More recently had an episode where he he developed some left ankle pain and swelling but this resolved within a few weeks and not having a problem at the moment.  He had 1 episode of calcified nodule in the right elbow broke open wound was draining for about 3 days but this healed without any specific intervention.  Apparently his wife has noticed some increasing changes on the lateral side of his left foot but he does not notice any symptoms there.  Otherwise skin rash and nodules are about the same.   Previous HPI 10/02/2020 History of Present Illness: Evan Burns is a 42 y.o. male with a history of dermatomyositis here for evaluation of myalgias and skin problems. He has a long history of DM diagnosed around 20 years ago originally with muscle and skin disease activity. He was treated with steroids for this  with apparent resolution of his muscle inflammation. Since that time he has had continued development of calcifications that sometimes become swollen or infected but no known recurrence of muscular involvement. He takes treatments for asthma but no known ILD. Today he complains of joint pain in multiple sites the right groin, left ankle, and right shoulder. He has had previous imaging of the shoulder and hip showing right shoulder bursitis and moderately advanced OA of the right hip. He has not had imaging for the left ankle pain. He does see swelling around the left ankle sometimes and worse after working all day. He has not noticed any focal weakness except  around his right hip and thigh. He denies cough, dyspnea, fevers, weight loss, or lymphadenopathy.   Review of Systems  Constitutional:  Positive for fatigue.  HENT:  Negative for mouth sores and mouth dryness.   Eyes:  Negative for dryness.  Respiratory:  Negative for shortness of breath.   Cardiovascular:  Negative for chest pain and palpitations.  Gastrointestinal:  Negative for blood in stool, constipation and diarrhea.  Endocrine: Negative for increased urination.  Genitourinary:  Negative for involuntary urination.  Musculoskeletal:  Positive for joint pain, joint pain, joint swelling, myalgias, morning stiffness, muscle tenderness and myalgias. Negative for gait problem and muscle weakness.  Skin:  Positive for sensitivity to sunlight. Negative for color change, rash and hair loss.  Allergic/Immunologic: Negative for susceptible to infections.  Neurological:  Positive for headaches. Negative for dizziness.  Hematological:  Negative for swollen glands.  Psychiatric/Behavioral:  Negative for depressed mood and sleep disturbance. The patient is not nervous/anxious.     PMFS History:  Patient Active Problem List   Diagnosis Date Noted   Infrapatellar bursitis of right knee 05/30/2023   Voice hoarseness 11/15/2022   Asthma exacerbation 11/02/2022   Occipital lymphadenopathy 09/28/2022   Cough variant asthma 09/01/2022   Seasonal and perennial allergic rhinitis 09/01/2022   Asthmatic bronchitis 08/23/2022   Facial rash 03/09/2022   COVID-19 01/08/2022   Abscess of left elbow 10/01/2021   High risk medication use 06/01/2021   Vitamin D deficiency 03/19/2021   Lower leg edema 01/09/2021   Pain in left ankle and joints of left foot 10/02/2020   Calcinosis cutis 10/02/2020   Arthritis of right hip 09/16/2020   Tinea cruris 07/28/2020   Leg pain, bilateral 07/28/2020   Prediabetes 03/19/2020   Chest mass 02/14/2019   Other chest pain 02/14/2019   Superficial phlebitis, chest  wall 02/14/2019   Fatigue 11/22/2018   Cough 11/08/2015   Possible GERD 10/29/2010   Essential hypertension 10/12/2010   Dermatomyositis (HCC) 06/17/2008   Asthma 02/22/2007    Past Medical History:  Diagnosis Date   Arthritis of right hip 09/16/2020   X-ray October 2021   Asthma    Dermatomyositis (HCC)    Hypertension     Family History  Problem Relation Age of Onset   Asthma Mother    Hypertension Mother    Diabetes Mother    Kidney disease Mother    Hyperlipidemia Mother    Arthritis Mother    Hypertension Father    Hyperlipidemia Father    Past Surgical History:  Procedure Laterality Date   HAND SURGERY     INCISION AND DRAINAGE OF WOUND     MASS EXCISION Left 07/27/2019   Procedure: LEFT LONG FINGER NODULE EXCISION;  Surgeon: Tarry Kos, MD;  Location: [facility];  Service: Orthopedics;  Laterality: Left;   TYMPANOSTOMY TUBE  PLACEMENT     Social History   Social History Narrative   Pt lives at home w/ parents       Very active at work - works two jobs   Immunization History  Administered Date(s) Administered   Influenza Split 08/25/2011, 07/25/2012   Influenza Whole 08/08/2007, 08/14/2008, 07/23/2009, 07/21/2010   Influenza,inj,Quad PF,6+ Mos 07/24/2013, 07/23/2014, 07/01/2015, 07/05/2016, 08/06/2017, 08/04/2018, 09/07/2019, 07/28/2020, 07/03/2021, 07/26/2022   PFIZER Comirnaty(Gray Top)Covid-19 Tri-Sucrose Vaccine 08/06/2022   PFIZER(Purple Top)SARS-COV-2 Vaccination 01/07/2020, 01/28/2020, 09/22/2020   Pfizer Covid-19 Vaccine Bivalent Booster 27yrs & up 08/04/2021   Pneumococcal Conjugate-13 09/21/2021   Pneumococcal Polysaccharide-23 10/01/2014   Tdap 09/13/2013, 04/18/2019     Objective: Vital Signs: BP 138/85 (BP Location: Right Arm, Patient Position: Sitting, Cuff Size: Normal)   Pulse 70   Resp 14   Ht 5\' 7"  (1.702 m)   Wt 245 lb (111.1 kg)   BMI 38.37 kg/m    Physical Exam HENT:     Mouth/Throat:     Mouth: Mucous  membranes are moist.     Pharynx: Oropharynx is clear.  Eyes:     Conjunctiva/sclera: Conjunctivae normal.  Cardiovascular:     Rate and Rhythm: Normal rate and regular rhythm.  Pulmonary:     Effort: Pulmonary effort is normal.     Breath sounds: Normal breath sounds.  Lymphadenopathy:     Cervical: No cervical adenopathy.  Skin:    General: Skin is warm and dry.     Comments: Widespread patchy hyper and hypopigmentation on face and upper extremities Extensive subcutaneous nodules largest over bilateral elbow extensor surfaces nontender not mobile no open lesions or erythema Hard and mobile nontender nodules on bilateral hands most extensive on dorsal surface of fingers especially third digit  Neurological:     Mental Status: He is alert.  Psychiatric:        Mood and Affect: Mood normal.      Musculoskeletal Exam:  Shoulders full ROM no tenderness or swelling Elbows full ROM no tenderness or swelling Wrists full ROM no tenderness or swelling Fingers full ROM no tenderness or swelling Right hip with decreased internal rotation range of motion Knees full ROM, no crepitus or effusions, right knee with focal tenderness at patellar tendon insertion but no pain provoked with passive movement   Investigation: No additional findings.  Imaging: No results found.  Recent Labs: Lab Results  Component Value Date   WBC 5.3 09/28/2022   HGB 13.9 09/28/2022   PLT 238.0 09/28/2022   NA 142 09/28/2022   K 4.3 09/28/2022   CL 106 09/28/2022   CO2 31 09/28/2022   GLUCOSE 94 09/28/2022   BUN 16 09/28/2022   CREATININE 1.20 09/28/2022   BILITOT 0.5 09/28/2022   ALKPHOS 46 09/28/2022   AST 24 09/28/2022   ALT 28 09/28/2022   PROT 6.7 09/28/2022   ALBUMIN 4.3 09/28/2022   CALCIUM 9.0 09/28/2022   GFRAA  02/01/2010    >60        The eGFR has been calculated using the MDRD equation. This calculation has not been validated in all clinical situations. eGFR's persistently <60  mL/min signify possible Chronic Kidney Disease.    Speciality Comments: No specialty comments available.  Procedures:  No procedures performed Allergies: Sulfonamide derivatives, Cephalexin, and Lisinopril   Assessment / Plan:     Visit Diagnoses: Dermatomyositis [facility]) - will refer to [facility] dermatology Dr. Ardelle Anton, referral placed 09/24/2022 - Plan: CK, Sedimentation rate  Skin disease still  appears to be active with additional new nodules and some swelling.  Not sure about reliability of CK biomarker given his probably abnormally high baseline.  Will recheck this along with sed rate for monitoring of systemic disease evidence.  Seems to primarily be skin disease active did not have notable response with trial of methotrexate I think would benefit to see a dermatology specialist for treatment options referral preferably to Dr. Reche Dixon.   High risk medication use - Meloxicam 7.5 MG Take daily or twice daily as needed - Plan: COMPLETE METABOLIC PANEL WITH GFR  Checking CMP for medication monitoring with long-term use of daily NSAIDs.  Arthritis of right hip - Plan: meloxicam (MOBIC) 7.5 MG tablet  Getting pretty good benefit on low-dose meloxicam maintaining for moderately severe osteoarthritis.  He staying physically active with exercising including lower extremity resistance training.  Infrapatellar bursitis of right knee  Knee pain appears most consistent with infrapatellar bursitis which is a new problem.  Discussed conservative treatment for now as well as he is continuing long-term low-dose NSAIDs.  Orders: Orders Placed This Encounter  Procedures   CK   Sedimentation rate   COMPLETE METABOLIC PANEL WITH GFR   Meds ordered this encounter  Medications   meloxicam (MOBIC) 7.5 MG tablet    Sig: Take by mouth with food daily or twice daily as needed    Dispense:  60 tablet    Refill:  5     Follow-Up Instructions: No follow-ups on file.   Fuller Plan,  MD  Note - This record has been created using [employer].  Chart creation errors have been sought, but may not always  have been located. Such creation errors do not reflect on  the standard of medical care.

## 2023-05-30 ENCOUNTER — Other Ambulatory Visit: Payer: Self-pay | Admitting: *Deleted

## 2023-05-30 ENCOUNTER — Ambulatory Visit: Payer: Federal, State, Local not specified - PPO | Attending: Internal Medicine | Admitting: Internal Medicine

## 2023-05-30 ENCOUNTER — Encounter: Payer: Self-pay | Admitting: Internal Medicine

## 2023-05-30 VITALS — BP 138/85 | HR 70 | Resp 14 | Ht 67.0 in | Wt 245.0 lb

## 2023-05-30 DIAGNOSIS — R600 Localized edema: Secondary | ICD-10-CM

## 2023-05-30 DIAGNOSIS — Z79899 Other long term (current) drug therapy: Secondary | ICD-10-CM | POA: Diagnosis not present

## 2023-05-30 DIAGNOSIS — M3313 Other dermatomyositis without myopathy: Secondary | ICD-10-CM

## 2023-05-30 DIAGNOSIS — R21 Rash and other nonspecific skin eruption: Secondary | ICD-10-CM

## 2023-05-30 DIAGNOSIS — M7051 Other bursitis of knee, right knee: Secondary | ICD-10-CM

## 2023-05-30 DIAGNOSIS — M1611 Unilateral primary osteoarthritis, right hip: Secondary | ICD-10-CM | POA: Diagnosis not present

## 2023-05-30 DIAGNOSIS — L942 Calcinosis cutis: Secondary | ICD-10-CM

## 2023-05-30 LAB — SEDIMENTATION RATE: Sed Rate: 2 mm/h (ref 0–15)

## 2023-05-30 MED ORDER — MELOXICAM 7.5 MG PO TABS
ORAL_TABLET | ORAL | 5 refills | Status: DC
Start: 2023-05-30 — End: 2024-03-27

## 2023-05-30 NOTE — Patient Instructions (Signed)
I think your knee pain is due to infrapatellar bursitis. This is usually related to overuse or minor trauma to the area and can improve on its own. Supportive treatments include oral NSAIDs like meloxicam, rest, use of a flexible knee sleeve or wrap while recovering.

## 2023-06-30 ENCOUNTER — Telehealth: Payer: Self-pay | Admitting: Internal Medicine

## 2023-06-30 NOTE — Telephone Encounter (Signed)
Patient would like to know if PCP thinks they should get a Covid shot. His mother would like a call back at [phone].

## 2023-06-30 NOTE — Telephone Encounter (Signed)
Spoke with patient's mom today.

## 2023-07-12 DIAGNOSIS — M25561 Pain in right knee: Secondary | ICD-10-CM | POA: Diagnosis not present

## 2023-07-12 DIAGNOSIS — M7651 Patellar tendinitis, right knee: Secondary | ICD-10-CM | POA: Diagnosis not present

## 2023-07-12 DIAGNOSIS — E65 Localized adiposity: Secondary | ICD-10-CM | POA: Diagnosis not present

## 2023-08-01 ENCOUNTER — Ambulatory Visit: Payer: Federal, State, Local not specified - PPO

## 2023-08-23 NOTE — Patient Instructions (Addendum)
      Blood work was ordered.   The lab is on the first floor.    Medications changes include :       A referral was ordered and someone will call you to schedule an appointment.     Return in about 6 months (around 02/22/2024) for Physical Exam.

## 2023-08-23 NOTE — Progress Notes (Unsigned)
    Subjective:    Patient ID: Evan Burns, male    DOB: 1981-10-20, 42 y.o.   MRN: [id number]      HPI Evan Burns is here for No chief complaint on file.        Medications and allergies reviewed with patient and updated if appropriate.  Current Outpatient Medications on File Prior to Visit  Medication Sig Dispense Refill   albuterol (VENTOLIN HFA) 108 (90 Base) MCG/ACT inhaler Inhale 2 puffs into the lungs every 6 (six) hours as needed for wheezing or shortness of breath. 18 g 11   amLODipine (NORVASC) 2.5 MG tablet Take 1 tablet by mouth daily.     Azelastine-Fluticasone 137-50 MCG/ACT SUSP Place 1 spray into the nose in the morning and at bedtime. 23 g 3   Butenafine HCl 1 % cream Apply twice daily to affected area 30 g 0   COD LIVER OIL PO Take 1 capsule by mouth daily.     Creatine POWD Take 1 Scoop by mouth.     diltiazem (CARDIZEM CD) 360 MG 24 hr capsule Take 1 capsule (360 mg total) by mouth daily. 90 capsule 1   Fluticasone-Umeclidin-Vilant (TRELEGY ELLIPTA) 200-62.5-25 MCG/ACT AEPB Inhale 1 puff into the lungs daily. Rinse mouth after each use. 60 each 3   hydrochlorothiazide (HYDRODIURIL) 25 MG tablet Take 1 tablet (25 mg total) by mouth daily. 90 tablet 3   hydrocortisone 2.5 % cream Apply topically.     levocetirizine (XYZAL) 5 MG tablet Take 1 tablet (5 mg total) by mouth every evening. 30 tablet 3   losartan (COZAAR) 100 MG tablet TAKE ONE TABLET BY MOUTH DAILY 90 tablet 1   meloxicam (MOBIC) 7.5 MG tablet Take by mouth with food daily or twice daily as needed 60 tablet 5   mometasone (ELOCON) 0.1 % cream Apply topically.     montelukast (SINGULAIR) 10 MG tablet Take 1 tablet (10 mg total) by mouth at bedtime. 30 tablet 3   Omega-3 Fatty Acids (FISH OIL) 1000 MG CAPS Take 1,000 mg by mouth daily.     omeprazole (PRILOSEC) 20 MG capsule Take 1 capsule (20 mg total) by mouth daily. 30 capsule 3   tacrolimus (PROTOPIC) 0.03 % ointment Apply 1 application  topically 2 (two) times daily.     triamcinolone cream (KENALOG) 0.1 % Apply topically.     VITAMIN D PO Take by mouth daily.     No current facility-administered medications on file prior to visit.    Review of Systems     Objective:  There were no vitals filed for this visit. BP Readings from Last 3 Encounters:  05/30/23 138/85  11/15/22 (!) 130/90  11/02/22 (!) 144/92   Wt Readings from Last 3 Encounters:  05/30/23 245 lb (111.1 kg)  11/15/22 257 lb 4.8 oz (116.7 kg)  11/02/22 251 lb (113.9 kg)   There is no height or weight on file to calculate BMI.    Physical Exam         Assessment & Plan:    See Problem List for Assessment and Plan of chronic medical problems.

## 2023-08-24 ENCOUNTER — Ambulatory Visit: Payer: Federal, State, Local not specified - PPO | Admitting: Internal Medicine

## 2023-08-24 VITALS — BP 116/74 | HR 72 | Temp 98.6°F | Ht 67.0 in | Wt 240.0 lb

## 2023-08-24 DIAGNOSIS — J4520 Mild intermittent asthma, uncomplicated: Secondary | ICD-10-CM

## 2023-08-24 DIAGNOSIS — J4541 Moderate persistent asthma with (acute) exacerbation: Secondary | ICD-10-CM | POA: Diagnosis not present

## 2023-08-24 DIAGNOSIS — I10 Essential (primary) hypertension: Secondary | ICD-10-CM | POA: Diagnosis not present

## 2023-08-24 MED ORDER — PREDNISONE 20 MG PO TABS: 40.0000 mg | ORAL_TABLET | Freq: Every day | ORAL | 0 refills | Status: AC

## 2023-08-24 MED ORDER — AZITHROMYCIN 250 MG PO TABS: ORAL_TABLET | ORAL | 0 refills | Status: DC

## 2023-08-24 MED ORDER — ALBUTEROL SULFATE HFA 108 (90 BASE) MCG/ACT IN AERS
2.0000 | INHALATION_SPRAY | Freq: Four times a day (QID) | RESPIRATORY_TRACT | 11 refills | Status: AC | PRN
Start: 2023-08-24 — End: ?

## 2023-08-24 MED ORDER — LOSARTAN POTASSIUM-HCTZ 100-25 MG PO TABS: 1.0000 | ORAL_TABLET | Freq: Every day | ORAL | 1 refills | Status: DC

## 2023-08-24 MED ORDER — BLOOD PRESSURE MONITOR/S CUFF MISC
0 refills | Status: AC
Start: 2023-08-24 — End: ?

## 2023-08-24 NOTE — Assessment & Plan Note (Signed)
Chronic Blood pressure not ideally controlled He is not monitoring it at home so it is hard to say how well-controlled it is-does have a few elevated readings Blood pressure cuff prescription sent to pharmacy-advised he needs to start monitoring BP Continue diltiazem 360 mg daily Restart losartan-hydrochlorothiazide 100-25 mg daily Stressed monitoring BP

## 2023-08-24 NOTE — Assessment & Plan Note (Signed)
Chronic Following with allergy On Breztri bid and albuterol prn

## 2023-08-24 NOTE — Assessment & Plan Note (Signed)
Acute Symptoms consistent with asthma exacerbation, related to URI ?  Bacterial Continue inhalers-Trelegy 1 puff daily, albuterol as needed Prednisone 40 mg daily x 5 days Z-Pak Call if symptoms or not improving He will update me on his symptoms

## 2023-08-31 ENCOUNTER — Encounter: Payer: Self-pay | Admitting: Internal Medicine

## 2023-09-01 MED ORDER — PREDNISONE 20 MG PO TABS: 40.0000 mg | ORAL_TABLET | Freq: Every day | ORAL | 0 refills | Status: AC

## 2023-09-08 ENCOUNTER — Other Ambulatory Visit: Payer: Self-pay | Admitting: Internal Medicine

## 2023-10-08 ENCOUNTER — Telehealth: Payer: Federal, State, Local not specified - PPO | Admitting: Physician Assistant

## 2023-10-08 ENCOUNTER — Ambulatory Visit: Payer: Federal, State, Local not specified - PPO

## 2023-10-08 DIAGNOSIS — J069 Acute upper respiratory infection, unspecified: Secondary | ICD-10-CM

## 2023-10-08 DIAGNOSIS — Z8709 Personal history of other diseases of the respiratory system: Secondary | ICD-10-CM

## 2023-10-08 DIAGNOSIS — R051 Acute cough: Secondary | ICD-10-CM

## 2023-10-08 MED ORDER — FLUTICASONE PROPIONATE 50 MCG/ACT NA SUSP: 2.0000 | Freq: Every day | NASAL | 6 refills | Status: AC

## 2023-10-08 MED ORDER — LEVOCETIRIZINE DIHYDROCHLORIDE 5 MG PO TABS: 5.0000 mg | ORAL_TABLET | Freq: Every evening | ORAL | 0 refills | Status: DC

## 2023-10-08 MED ORDER — BENZONATATE 100 MG PO CAPS: 100.0000 mg | ORAL_CAPSULE | Freq: Two times a day (BID) | ORAL | 0 refills | Status: DC | PRN

## 2023-10-08 NOTE — Patient Instructions (Signed)
Evan Burns, thank you for joining Laure Kidney, PA-C for today's virtual visit.  While this provider is not your primary care provider (PCP), if your PCP is located in our provider database this encounter information will be shared with them immediately following your visit.   A [facility] MyChart account gives you access to today's visit and all your visits, tests, and labs performed at [facility] " click here if you don't have a [facility] MyChart account or go to [email or web address]  Consent: (Patient) Evan Burns provided verbal consent for this virtual visit at the beginning of the encounter.  Current Medications:  Current Outpatient Medications:    benzonatate (TESSALON) 100 MG capsule, Take 1 capsule (100 mg total) by mouth 2 (two) times daily as needed for cough., Disp: 20 capsule, Rfl: 0   fluticasone (FLONASE) 50 MCG/ACT nasal spray, Place 2 sprays into both nostrils daily., Disp: 16 g, Rfl: 6   levocetirizine (XYZAL ALLERGY 24HR) 5 MG tablet, Take 1 tablet (5 mg total) by mouth every evening for 14 days., Disp: 14 tablet, Rfl: 0   albuterol (VENTOLIN HFA) 108 (90 Base) MCG/ACT inhaler, Inhale 2 puffs into the lungs every 6 (six) hours as needed for wheezing or shortness of breath., Disp: 18 g, Rfl: 11   Azelastine-Fluticasone 137-50 MCG/ACT SUSP, Place 1 spray into the nose in the morning and at bedtime., Disp: 23 g, Rfl: 3   azithromycin (ZITHROMAX) 250 MG tablet, Take two tabs the first day and then one tab daily for four days, Disp: 6 tablet, Rfl: 0   Blood Pressure Monitoring (BLOOD PRESSURE MONITOR/S CUFF) MISC, UAD to check BP at home  I10, Disp: 1 each, Rfl: 0   Butenafine HCl 1 % cream, Apply twice daily to affected area, Disp: 30 g, Rfl: 0   COD LIVER OIL PO, Take 1 capsule by mouth daily., Disp: , Rfl:    Creatine POWD, Take 1 Scoop by mouth., Disp: , Rfl:    diltiazem (CARDIZEM CD) 360 MG 24 hr capsule, TAKE 1 CAPSULE BY MOUTH DAILY, Disp:  90 capsule, Rfl: 1   Fluticasone-Umeclidin-Vilant (TRELEGY ELLIPTA) 200-62.5-25 MCG/ACT AEPB, Inhale 1 puff into the lungs daily. Rinse mouth after each use., Disp: 60 each, Rfl: 3   hydrocortisone 2.5 % cream, Apply topically., Disp: , Rfl:    levocetirizine (XYZAL) 5 MG tablet, Take 1 tablet (5 mg total) by mouth every evening., Disp: 30 tablet, Rfl: 3   losartan-hydrochlorothiazide (HYZAAR) 100-25 MG tablet, Take 1 tablet by mouth daily., Disp: 90 tablet, Rfl: 1   meloxicam (MOBIC) 7.5 MG tablet, Take by mouth with food daily or twice daily as needed, Disp: 60 tablet, Rfl: 5   mometasone (ELOCON) 0.1 % cream, Apply topically., Disp: , Rfl:    montelukast (SINGULAIR) 10 MG tablet, Take 1 tablet (10 mg total) by mouth at bedtime., Disp: 30 tablet, Rfl: 3   Omega-3 Fatty Acids (FISH OIL) 1000 MG CAPS, Take 1,000 mg by mouth daily., Disp: , Rfl:    omeprazole (PRILOSEC) 20 MG capsule, Take 1 capsule (20 mg total) by mouth daily., Disp: 30 capsule, Rfl: 3   tacrolimus (PROTOPIC) 0.03 % ointment, Apply 1 application topically 2 (two) times daily., Disp: , Rfl:    triamcinolone cream (KENALOG) 0.1 %, Apply topically., Disp: , Rfl:    VITAMIN D PO, Take by mouth daily., Disp: , Rfl:    Medications ordered in this encounter:  Meds ordered this encounter  Medications   benzonatate (  TESSALON) 100 MG capsule    Sig: Take 1 capsule (100 mg total) by mouth 2 (two) times daily as needed for cough.    Dispense:  20 capsule    Refill:  0    Supervising Provider:   Merrilee Jansky [[id number]]   fluticasone (FLONASE) 50 MCG/ACT nasal spray    Sig: Place 2 sprays into both nostrils daily.    Dispense:  16 g    Refill:  6    Supervising Provider:   Merrilee Jansky [[id number]]   levocetirizine (XYZAL ALLERGY 24HR) 5 MG tablet    Sig: Take 1 tablet (5 mg total) by mouth every evening for 14 days.    Dispense:  14 tablet    Refill:  0    Supervising Provider:   Merrilee Jansky [[id number]]     *If you  need refills on other medications prior to your next appointment, please contact your pharmacy*  Follow-Up: Call back or seek an in-person evaluation if the symptoms worsen or if the condition fails to improve as anticipated.  [facility] [phone]  Other Instructions Take all medications as prescribed. Follow up with your primary provider in 2-3 days.    If you have been instructed to have an in-person evaluation today at a local Urgent Care facility, please use the link below. It will take you to a list of all of our available [facility], including address, phone number and hours of operation. Please do not delay care.  [facility]  If you or a family member do not have a primary care provider, use the link below to schedule a visit and establish care. When you choose a [facility] primary care physician or advanced practice provider, you gain a long-term partner in health. Find a Primary Care Provider  Learn more about [facility]'s in-office and virtual care options: [facility] - Get Care Now

## 2023-10-08 NOTE — Progress Notes (Signed)
Virtual Visit Consent   Evan Burns, you are scheduled for a virtual visit with a [facility] provider today. Just as with appointments in the office, your consent must be obtained to participate. Your consent will be active for this visit and any virtual visit you may have with one of our providers in the next 365 days. If you have a MyChart account, a copy of this consent can be sent to you electronically.  As this is a virtual visit, video technology does not allow for your provider to perform a traditional examination. This may limit your provider's ability to fully assess your condition. If your provider identifies any concerns that need to be evaluated in person or the need to arrange testing (such as labs, EKG, etc.), we will make arrangements to do so. Although advances in technology are sophisticated, we cannot ensure that it will always work on either your end or our end. If the connection with a video visit is poor, the visit may have to be switched to a telephone visit. With either a video or telephone visit, we are not always able to ensure that we have a secure connection.  By engaging in this virtual visit, you consent to the provision of healthcare and authorize for your insurance to be billed (if applicable) for the services provided during this visit. Depending on your insurance coverage, you may receive a charge related to this service.  I need to obtain your verbal consent now. Are you willing to proceed with your visit today? Evan Burns has provided verbal consent on 10/08/2023 for a virtual visit (video or telephone). Evan Burns, [place]  Date: 10/08/2023 9:24 AM  Virtual Visit via Video Note   I, Evan Burns, connected with  Evan Burns  ([id number], 1980-12-17) on 10/08/23 at  9:15 AM EST by a video-enabled telemedicine application and verified that I am speaking with the correct person using two identifiers.  Location: Patient: Virtual Visit Location  Patient: Home Provider: Virtual Visit Location Provider: Home Office   I discussed the limitations of evaluation and management by telemedicine and the availability of in person appointments. The patient expressed understanding and agreed to proceed.    History of Present Illness: Evan Burns is a 42 y.o. who identifies as a male who was assigned male at birth, and is being seen today for URI.  HPI: URI  This is a new problem. The current episode started yesterday. The problem has been unchanged. There has been no fever. Associated symptoms include coughing. He has tried inhaler use for the symptoms. The treatment provided mild relief.    Problems:  Patient Active Problem List   Diagnosis Date Noted   Infrapatellar bursitis of right knee 05/30/2023   Voice hoarseness 11/15/2022   Asthma exacerbation 11/02/2022   Occipital lymphadenopathy 09/28/2022   Cough variant asthma 09/01/2022   Seasonal and perennial allergic rhinitis 09/01/2022   Asthmatic bronchitis 08/23/2022   Facial rash 03/09/2022   COVID-19 01/08/2022   Vitamin D deficiency 03/19/2021   Lower leg edema 01/09/2021   Pain in left ankle and joints of left foot 10/02/2020   Calcinosis cutis 10/02/2020   Arthritis of right hip 09/16/2020   Tinea cruris 07/28/2020   Leg pain, bilateral 07/28/2020   Prediabetes 03/19/2020   Chest mass 02/14/2019   Other chest pain 02/14/2019   Superficial phlebitis, chest wall 02/14/2019   Fatigue 11/22/2018   Cough 11/08/2015   Possible GERD 10/29/2010   Essential hypertension 10/12/2010  Dermatomyositis (HCC) 06/17/2008   Asthma 02/22/2007    Allergies:  Allergies  Allergen Reactions   Sulfonamide Derivatives     REACTION: SWELLING (in childhood)   Cephalexin     REACTION: vomiting  No rash or fever   Lisinopril     REACTION: cough   Medications:  Current Outpatient Medications:    albuterol (VENTOLIN HFA) 108 (90 Base) MCG/ACT inhaler, Inhale 2 puffs into the  lungs every 6 (six) hours as needed for wheezing or shortness of breath., Disp: 18 g, Rfl: 11   Azelastine-Fluticasone 137-50 MCG/ACT SUSP, Place 1 spray into the nose in the morning and at bedtime., Disp: 23 g, Rfl: 3   azithromycin (ZITHROMAX) 250 MG tablet, Take two tabs the first day and then one tab daily for four days, Disp: 6 tablet, Rfl: 0   Blood Pressure Monitoring (BLOOD PRESSURE MONITOR/S CUFF) MISC, UAD to check BP at home  I10, Disp: 1 each, Rfl: 0   Butenafine HCl 1 % cream, Apply twice daily to affected area, Disp: 30 g, Rfl: 0   COD LIVER OIL PO, Take 1 capsule by mouth daily., Disp: , Rfl:    Creatine POWD, Take 1 Scoop by mouth., Disp: , Rfl:    diltiazem (CARDIZEM CD) 360 MG 24 hr capsule, TAKE 1 CAPSULE BY MOUTH DAILY, Disp: 90 capsule, Rfl: 1   Fluticasone-Umeclidin-Vilant (TRELEGY ELLIPTA) 200-62.5-25 MCG/ACT AEPB, Inhale 1 puff into the lungs daily. Rinse mouth after each use., Disp: 60 each, Rfl: 3   hydrocortisone 2.5 % cream, Apply topically., Disp: , Rfl:    levocetirizine (XYZAL) 5 MG tablet, Take 1 tablet (5 mg total) by mouth every evening., Disp: 30 tablet, Rfl: 3   losartan-hydrochlorothiazide (HYZAAR) 100-25 MG tablet, Take 1 tablet by mouth daily., Disp: 90 tablet, Rfl: 1   meloxicam (MOBIC) 7.5 MG tablet, Take by mouth with food daily or twice daily as needed, Disp: 60 tablet, Rfl: 5   mometasone (ELOCON) 0.1 % cream, Apply topically., Disp: , Rfl:    montelukast (SINGULAIR) 10 MG tablet, Take 1 tablet (10 mg total) by mouth at bedtime., Disp: 30 tablet, Rfl: 3   Omega-3 Fatty Acids (FISH OIL) 1000 MG CAPS, Take 1,000 mg by mouth daily., Disp: , Rfl:    omeprazole (PRILOSEC) 20 MG capsule, Take 1 capsule (20 mg total) by mouth daily., Disp: 30 capsule, Rfl: 3   tacrolimus (PROTOPIC) 0.03 % ointment, Apply 1 application topically 2 (two) times daily., Disp: , Rfl:    triamcinolone cream (KENALOG) 0.1 %, Apply topically., Disp: , Rfl:    VITAMIN D PO, Take by  mouth daily., Disp: , Rfl:   Observations/Objective: Patient is well-developed, well-nourished in no acute distress.  Resting comfortably  at home.  Head is normocephalic, atraumatic.  No labored breathing.  Speech is clear and coherent with logical content.  Patient is alert and oriented at baseline.    Assessment and Plan: 1. Upper respiratory tract infection, unspecified type (Primary) patient presents with symptoms suspicious for likely viral upper respiratory infection. Differential includes bacterial pneumonia, sinusitis, allergic rhinitis,. Do not suspect underlying cardiopulmonary process. I considered, but think unlikely, dangerous causes of this patient's symptoms to include ACS, CHF or COPD exacerbations, pneumonia, pneumothorax. Patient is nontoxic appearing and not in need of emergent medical intervention.  Plan: reassurance, reassessment, over the counter medications, discharge with PCP followup   Follow Up Instructions: I discussed the assessment and treatment plan with the patient. The patient was provided an opportunity  to ask questions and all were answered. The patient agreed with the plan and demonstrated an understanding of the instructions.  A copy of instructions were sent to the patient via MyChart unless otherwise noted below.     The patient was advised to call back or seek an in-person evaluation if the symptoms worsen or if the condition fails to improve as anticipated.    Evan Kidney, PA-C

## 2023-10-08 NOTE — Progress Notes (Signed)
Because of your symptoms, I feel your condition warrants further evaluation and I recommend that you be seen in a face to face visit.   NOTE: There will be NO CHARGE for this eVisit   If you are having a true medical emergency please call 911.      For an urgent face to face visit, [facility] has eight urgent care centers for your convenience:   NEW!! [facility] Health Ur[facility] Center at [facility] Get Driving Directions [phone] [address], [address] [place], [address]    [facility] Health Urgent Care Center at [facility] Get Driving Directions [phone] [address] [place], [place] [address]   [facility] Health Urgent Care Center [facility]) Get Driving Directions [phone] [address] [place], [place] [address]  [facility] Health Urgent Care Center [facility]) Get Driving Directions [phone] [address] [place],  [place]  [address]  [facility] Health Urgent Care Center [facility] - at [employer]  [phone] [address] [place],  [place] [address]   [facility] Health Urgent Care at [facility] Get Driving Directions [phone] [address], [address] [place], [place] [address]   [facility] Health Urgent Care at [facility] Get Driving Directions  [phone] [address] [place], [place] [address]   [facility] Health Urgent Care at [facility] Directions [phone] [address], [address] [place], [place] [address]  Your MyChart E-visit questionnaire answers were reviewed by a board certified advanced clinical practitioner to complete your personal care plan based on your specific symptoms.  Thank you for using e-Visits.

## 2023-10-12 NOTE — Progress Notes (Unsigned)
Subjective:    Patient ID: Evan Burns, male    DOB: June 05, 1981, 42 y.o.   MRN: [id number]      HPI Evan Burns is here for No chief complaint on file.   Cough: He did an video visit 12/14 for cold symptoms.  He had had the cough for 3 days at that time.  He also noted sore throat, headache and nasal congestion.  He did have a little bit of a productive cough at times.  The mucus was initially yellow and changed to brown-red.  He was prescribed Tessalon Perles, Flonase, Xyzal for a URI that was thought to be viral.     Medications and allergies reviewed with patient and updated if appropriate.  Current Outpatient Medications on File Prior to Visit  Medication Sig Dispense Refill   albuterol (VENTOLIN HFA) 108 (90 Base) MCG/ACT inhaler Inhale 2 puffs into the lungs every 6 (six) hours as needed for wheezing or shortness of breath. 18 g 11   Azelastine-Fluticasone 137-50 MCG/ACT SUSP Place 1 spray into the nose in the morning and at bedtime. 23 g 3   azithromycin (ZITHROMAX) 250 MG tablet Take two tabs the first day and then one tab daily for four days 6 tablet 0   benzonatate (TESSALON) 100 MG capsule Take 1 capsule (100 mg total) by mouth 2 (two) times daily as needed for cough. 20 capsule 0   Blood Pressure Monitoring (BLOOD PRESSURE MONITOR/S CUFF) MISC UAD to check BP at home  I10 1 each 0   Butenafine HCl 1 % cream Apply twice daily to affected area 30 g 0   COD LIVER OIL PO Take 1 capsule by mouth daily.     Creatine POWD Take 1 Scoop by mouth.     diltiazem (CARDIZEM CD) 360 MG 24 hr capsule TAKE 1 CAPSULE BY MOUTH DAILY 90 capsule 1   fluticasone (FLONASE) 50 MCG/ACT nasal spray Place 2 sprays into both nostrils daily. 16 g 6   Fluticasone-Umeclidin-Vilant (TRELEGY ELLIPTA) 200-62.5-25 MCG/ACT AEPB Inhale 1 puff into the lungs daily. Rinse mouth after each use. 60 each 3   hydrocortisone 2.5 % cream Apply topically.     levocetirizine (XYZAL ALLERGY 24HR) 5 MG tablet Take  1 tablet (5 mg total) by mouth every evening for 14 days. 14 tablet 0   levocetirizine (XYZAL) 5 MG tablet Take 1 tablet (5 mg total) by mouth every evening. 30 tablet 3   losartan-hydrochlorothiazide (HYZAAR) 100-25 MG tablet Take 1 tablet by mouth daily. 90 tablet 1   meloxicam (MOBIC) 7.5 MG tablet Take by mouth with food daily or twice daily as needed 60 tablet 5   mometasone (ELOCON) 0.1 % cream Apply topically.     montelukast (SINGULAIR) 10 MG tablet Take 1 tablet (10 mg total) by mouth at bedtime. 30 tablet 3   Omega-3 Fatty Acids (FISH OIL) 1000 MG CAPS Take 1,000 mg by mouth daily.     omeprazole (PRILOSEC) 20 MG capsule Take 1 capsule (20 mg total) by mouth daily. 30 capsule 3   tacrolimus (PROTOPIC) 0.03 % ointment Apply 1 application topically 2 (two) times daily.     triamcinolone cream (KENALOG) 0.1 % Apply topically.     VITAMIN D PO Take by mouth daily.     No current facility-administered medications on file prior to visit.    Review of Systems     Objective:  There were no vitals filed for this visit. BP Readings from Last 3  Encounters:  08/24/23 116/74  05/30/23 138/85  11/15/22 (!) 130/90   Wt Readings from Last 3 Encounters:  08/24/23 240 lb (108.9 kg)  05/30/23 245 lb (111.1 kg)  11/15/22 257 lb 4.8 oz (116.7 kg)   There is no height or weight on file to calculate BMI.    Physical Exam Constitutional:      General: He is not in acute distress.    Appearance: Normal appearance. He is not ill-appearing.  HENT:     Head: Normocephalic.     Right Ear: Tympanic membrane, ear canal and external ear normal. There is no impacted cerumen.     Left Ear: Tympanic membrane, ear canal and external ear normal. There is no impacted cerumen.     Mouth/Throat:     Mouth: Mucous membranes are moist.     Pharynx: No oropharyngeal exudate or posterior oropharyngeal erythema.  Eyes:     Conjunctiva/sclera: Conjunctivae normal.  Cardiovascular:     Rate and Rhythm:  Normal rate and regular rhythm.  Pulmonary:     Effort: Pulmonary effort is normal. No respiratory distress.     Breath sounds: Normal breath sounds. No wheezing or rales.  Musculoskeletal:     Cervical back: Neck supple. No tenderness.  Lymphadenopathy:     Cervical: No cervical adenopathy.  Skin:    General: Skin is warm and dry.     Findings: No rash.  Neurological:     Mental Status: He is alert.            Assessment & Plan:    See Problem List for Assessment and Plan of chronic medical problems.

## 2023-10-12 NOTE — Patient Instructions (Addendum)
   Medications changes include :   None      Return if symptoms worsen or fail to improve.

## 2023-10-13 ENCOUNTER — Ambulatory Visit: Payer: Federal, State, Local not specified - PPO | Admitting: Internal Medicine

## 2023-10-13 VITALS — BP 124/88 | HR 81 | Temp 98.2°F | Ht 67.0 in | Wt 243.6 lb

## 2023-10-13 DIAGNOSIS — J4541 Moderate persistent asthma with (acute) exacerbation: Secondary | ICD-10-CM

## 2023-10-13 DIAGNOSIS — I10 Essential (primary) hypertension: Secondary | ICD-10-CM

## 2023-10-13 MED ORDER — MONTELUKAST SODIUM 10 MG PO TABS: 10.0000 mg | ORAL_TABLET | Freq: Every day | ORAL | 3 refills | Status: AC

## 2023-10-13 MED ORDER — PREDNISONE 20 MG PO TABS: 40.0000 mg | ORAL_TABLET | Freq: Every day | ORAL | 0 refills | Status: AC

## 2023-10-13 NOTE — Assessment & Plan Note (Signed)
Acute Symptoms consistent with asthma exacerbation, related to viral URI No need to abx Continue inhalers-Trelegy 1 puff daily, albuterol as needed Prednisone 40 mg daily x 5 days Call if symptoms or not improving Advised following with Dr Selena Batten

## 2023-10-13 NOTE — Assessment & Plan Note (Signed)
Chronic Blood pressure-borderline-diastolic is slightly higher than ideal  We will hold off on any changes today Continue diltiazem 360 mg daily Restart losartan-hydrochlorothiazide 100-25 mg daily

## 2023-11-07 ENCOUNTER — Telehealth: Payer: Self-pay

## 2023-11-07 NOTE — Telephone Encounter (Signed)
 Copied from CRM [id number]. Topic: Clinical - Medical Advice >> Nov 07, 2023 10:28 AM Deaijah H wrote:  Reason for CRM: Patients mother called in stating patient went to drug store to receive RSV shot but they stated he was too young and couldn't administrate shot & that he would need to speak with his doctor. Patients mother would like to know if RSV shot is offered at clinic / please call [phone]

## 2023-11-08 NOTE — Telephone Encounter (Signed)
 Spoke with Ms. Evan Burns today.

## 2023-11-16 NOTE — Progress Notes (Deleted)
 Office Visit Note  Patient: Evan Burns             Date of Birth: 23-Apr-1981           MRN: [id number]             PCP: [name], MD Referring: [name], MD Visit Date: 11/28/2023   Subjective:  No chief complaint on file.   History of Present Illness: Evan Burns is a 43 y.o. 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    Previous HPI 05/30/2023 Evan Burns is a 43 y.o. 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  After last visit recommended evaluation at [facility] dermatology with Dr. Oscar La but apparently appointment was never scheduled.  He is continue taking the meloxicam 7.5 mg with a pretty good benefit.  Still has right hip pain worse while weightbearing not severely limiting his activities.  He was seen for nodule swelling around the right eyelid had a biopsy for this in May consistent with calcinosis cutis and no recurrence since removal.  He is not having any pain with his other nodules unless he triggers this such as getting too much calcium.  More recently has complaint with right knee pain that has been acting up just in the past few weeks did not recall any preceding injury activity change and there is no associated swelling.   Previous HPI 09/13/2022 Evan Burns is a 43 y.o. 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  Currently has some right hip pain in the groin and down to the medial knee has not bothered him a bit more than usual for about 2 days.  Intermittently has worsening pain in this distribution.  The meloxicam is pretty helpful but is usually taking less than once daily on average.  Skin rashes have remained pretty stable and he has not noticed  specific new nodules.  Still having some changes at the lateral foot.  He has noticed a couple areas getting sensitive or having raised or irritated bumps at the right elbow nodules but no drainage or redness.   Previous HPI 06/14/2022 Evan Burns is a 43 y.o. male here for follow up for dermatomyositis with chronic calcinosis cutis currently on methotrexate 20 mg p.o. weekly folic acid 1 mg daily and is also on meloxicam 15 mg p.o. daily for right hip and knee joint pain.  Since starting the meloxicam he did notice improvement with his right hip pain actually feels like this was doing better and having more trouble of the knee than in the hip.  More recently had an episode where he he developed some left ankle pain and swelling but this resolved within a few weeks and not having a problem at the moment.  He had 1 episode of calcified nodule in the right elbow broke open wound was draining for about 3 days but this healed without any specific intervention.  Apparently his wife has noticed some increasing changes on the lateral side of his left foot but he does not notice any symptoms there.  Otherwise skin rash and nodules are about the same.   Previous HPI 10/02/2020 History of Present Illness: Evan Burns is a 43 y.o.  male with a history of dermatomyositis here for evaluation of myalgias and skin problems. He has a long history of DM diagnosed around 20 years ago originally with muscle and skin disease activity. He was treated with steroids for this with apparent resolution of his muscle inflammation. Since that time he has had continued development of calcifications that sometimes become swollen or infected but no known recurrence of muscular involvement. He takes treatments for asthma but no known ILD. Today he complains of joint pain in multiple sites the right groin, left ankle, and right shoulder. He has had previous imaging of the shoulder and hip showing right shoulder bursitis and  moderately advanced OA of the right hip. He has not had imaging for the left ankle pain. He does see swelling around the left ankle sometimes and worse after working all day. He has not noticed any focal weakness except around his right hip and thigh. He denies cough, dyspnea, fevers, weight loss, or lymphadenopathy.   No Rheumatology ROS completed.   PMFS History:  Patient Active Problem List   Diagnosis Date Noted   Infrapatellar bursitis of right knee 05/30/2023   Voice hoarseness 11/15/2022   Asthma exacerbation 11/02/2022   Occipital lymphadenopathy 09/28/2022   Cough variant asthma 09/01/2022   Seasonal and perennial allergic rhinitis 09/01/2022   Asthmatic bronchitis 08/23/2022   Facial rash 03/09/2022   COVID-19 01/08/2022   Vitamin D deficiency 03/19/2021   Lower leg edema 01/09/2021   Pain in left ankle and joints of left foot 10/02/2020   Calcinosis cutis 10/02/2020   Arthritis of right hip 09/16/2020   Tinea cruris 07/28/2020   Leg pain, bilateral 07/28/2020   Prediabetes 03/19/2020   Chest mass 02/14/2019   Other chest pain 02/14/2019   Superficial phlebitis, chest wall 02/14/2019   Fatigue 11/22/2018   Cough 11/08/2015   Possible GERD 10/29/2010   Essential hypertension 10/12/2010   Dermatomyositis (HCC) 06/17/2008   Asthma 02/22/2007    Past Medical History:  Diagnosis Date   Arthritis of right hip 09/16/2020   X-ray October 2021   Asthma    Dermatomyositis (HCC)    Hypertension     Family History  Problem Relation Age of Onset   Asthma Mother    Hypertension Mother    Diabetes Mother    Kidney disease Mother    Hyperlipidemia Mother    Arthritis Mother    Hypertension Father    Hyperlipidemia Father    Past Surgical History:  Procedure Laterality Date   HAND SURGERY     INCISION AND DRAINAGE OF WOUND     MASS EXCISION Left 07/27/2019   Procedure: LEFT LONG FINGER NODULE EXCISION;  Surgeon: Tarry Kos, MD;  Location: [facility];  Service: Orthopedics;  Laterality: Left;   TYMPANOSTOMY TUBE PLACEMENT     Social History   Social History Narrative   Pt lives at home w/ parents       Very active at work - works two jobs   Immunization History  Administered Date(s) Administered   Influenza Split 08/25/2011, 07/25/2012   Influenza Whole 08/08/2007, 08/14/2008, 07/23/2009, 07/21/2010   Influenza, Mdck, Trivalent,PF 6+ MOS(egg free) 07/18/2023   Influenza,inj,Quad PF,6+ Mos 07/24/2013, 07/23/2014, 07/01/2015, 07/05/2016, 08/06/2017, 08/04/2018, 09/07/2019, 07/28/2020, 07/03/2021, 07/26/2022   PFIZER Comirnaty(Gray Top)Covid-19 Tri-Sucrose Vaccine 08/06/2022   PFIZER(Purple Top)SARS-COV-2 Vaccination 01/07/2020, 01/28/2020, 09/22/2020   PNEUMOCOCCAL CONJUGATE-20 10/14/2023   Pfizer Covid-19 Vaccine Bivalent Booster 22yrs & up 08/04/2021   Pneumococcal Conjugate-13 09/21/2021  Pneumococcal Polysaccharide-23 10/01/2014   Tdap 09/13/2013, 04/18/2019     Objective: Vital Signs: There were no vitals taken for this visit.   Physical Exam   Musculoskeletal Exam: ***  CDAI Exam: CDAI Score: -- Patient Global: --; Provider Global: -- Swollen: --; Tender: -- Joint Exam 11/28/2023   No joint exam has been documented for this visit   There is currently no information documented on the homunculus. Go to the Rheumatology activity and complete the homunculus joint exam.  Investigation: No additional findings.  Imaging: No results found.  Recent Labs: Lab Results  Component Value Date   WBC 5.3 09/28/2022   HGB 13.9 09/28/2022   PLT 238.0 09/28/2022   NA 141 05/30/2023   K 4.4 05/30/2023   CL 105 05/30/2023   CO2 28 05/30/2023   GLUCOSE 81 05/30/2023   BUN 15 05/30/2023   CREATININE 1.12 05/30/2023   BILITOT 0.5 05/30/2023   ALKPHOS 46 09/28/2022   AST 22 05/30/2023   ALT 23 05/30/2023   PROT 7.0 05/30/2023   ALBUMIN 4.3 09/28/2022   CALCIUM 9.5 05/30/2023   GFRAA  02/01/2010    >60         The eGFR has been calculated using the MDRD equation. This calculation has not been validated in all clinical situations. eGFR's persistently <60 mL/min signify possible Chronic Kidney Disease.    Speciality Comments: No specialty comments available.  Procedures:  No procedures performed Allergies: Sulfonamide derivatives, Cephalexin, and Lisinopril   Assessment / Plan:     Visit Diagnoses: No diagnosis found.  ***  Orders: No orders of the defined types were placed in this encounter.  No orders of the defined types were placed in this encounter.    Follow-Up Instructions: No follow-ups on file.   Metta Clines, RT  Note - This record has been created using [employer].  Chart creation errors have been sought, but may not always  have been located. Such creation errors do not reflect on  the standard of medical care.

## 2023-11-25 ENCOUNTER — Encounter: Payer: Self-pay | Admitting: Internal Medicine

## 2023-11-25 NOTE — Telephone Encounter (Signed)
Advised patient to bring in the paperwork at his upcomming appointment on 11/28/2023. Advised the patient if Dr. Dimple Casey cannot fill it out on Monday, then we usually have a 7-10 business day turn around. Patient verbalized understanding.

## 2023-11-28 ENCOUNTER — Telehealth: Payer: Self-pay | Admitting: Internal Medicine

## 2023-11-28 ENCOUNTER — Ambulatory Visit: Payer: Federal, State, Local not specified - PPO | Admitting: Internal Medicine

## 2023-11-28 DIAGNOSIS — Z79899 Other long term (current) drug therapy: Secondary | ICD-10-CM

## 2023-11-28 DIAGNOSIS — M3313 Other dermatomyositis without myopathy: Secondary | ICD-10-CM

## 2023-11-28 DIAGNOSIS — M1611 Unilateral primary osteoarthritis, right hip: Secondary | ICD-10-CM

## 2023-11-28 DIAGNOSIS — M7051 Other bursitis of knee, right knee: Secondary | ICD-10-CM

## 2023-11-28 NOTE — Telephone Encounter (Signed)
Rescheduled pts appointment and pt would like to know if he can drop off his FMLA papers for Dr. Dimple Casey to fill out once he's back. Pt would also like to know if he could get a note for work due to him being out. Pt stated that him and Dr. Dimple Casey talked about this. Pt would like a call back at [phone]

## 2023-11-28 NOTE — Telephone Encounter (Signed)
Contacted the patient and advised he can drop off the FMLA paperwork before 5:00 pm today. Advised the patient the paperwork can take up to 7-10 business days to complete. Inquired why the patient needs a note for work. Patient states last week he was having bad right knee pain. Patient states he has not seen anyone else about it. Patient states his right knee is still bothering him. Patient states he was out of work from 01/29-01/31, Wednesday, Thursday, and Friday last week. Patient states he needs a note for work covering those days and allowing him to go back to work. Please advise.

## 2023-11-29 NOTE — Telephone Encounter (Signed)
Attempted to contact the patient and advise his FMLA paperwork is complete. Wanted to advise the patient that there is no fax number and the paperwork states to give it back to the patient. The patient did not answer and could not leave a message.

## 2023-11-29 NOTE — Telephone Encounter (Signed)
Contacted the patient and advised FMLA paperwork and Work excuse note will be upfront and ready for pickup. Patient verbalized understanding.

## 2023-12-01 DIAGNOSIS — M7651 Patellar tendinitis, right knee: Secondary | ICD-10-CM | POA: Diagnosis not present

## 2023-12-21 ENCOUNTER — Telehealth: Payer: Self-pay

## 2023-12-21 NOTE — Telephone Encounter (Signed)
 Patient dropped off FMLA paperwork to be corrected. Dr. Dimple Casey corrected the paperwork. A copy of the paperwork has been made. FMLA paperwork has been placed up front for the patient to pick up. Contacted the patient and patient verbalized understanding.

## 2024-01-10 NOTE — Progress Notes (Deleted)
 Office Visit Note  Patient: Evan Burns             Date of Birth: 04-Mar-1981           MRN: [id number]             PCP: [name], MD Referring: [name], MD Visit Date: 01/23/2024   Subjective:  No chief complaint on file.   History of Present Illness: Evan Burns is a 43 y.o. 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    Previous HPI 05/30/2023 Evan Burns is a 43 y.o. 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  After last visit recommended evaluation at [facility] dermatology with Dr. Oscar La but apparently appointment was never scheduled.  He is continue taking the meloxicam 7.5 mg with a pretty good benefit.  Still has right hip pain worse while weightbearing not severely limiting his activities.  He was seen for nodule swelling around the right eyelid had a biopsy for this in May consistent with calcinosis cutis and no recurrence since removal.  He is not having any pain with his other nodules unless he triggers this such as getting too much calcium.  More recently has complaint with right knee pain that has been acting up just in the past few weeks did not recall any preceding injury activity change and there is no associated swelling.   Previous HPI 09/13/2022 Evan Burns is a 43 y.o. 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  Currently has some right hip pain in the groin and down to the medial knee has not bothered him a bit more than usual for about 2 days.  Intermittently has worsening pain in this distribution.  The meloxicam is pretty helpful but is usually taking less than once daily on average.  Skin rashes have remained pretty stable and he has not noticed  specific new nodules.  Still having some changes at the lateral foot.  He has noticed a couple areas getting sensitive or having raised or irritated bumps at the right elbow nodules but no drainage or redness.   Previous HPI 06/14/2022 Evan Burns is a 43 y.o. male here for follow up for dermatomyositis with chronic calcinosis cutis currently on methotrexate 20 mg p.o. weekly folic acid 1 mg daily and is also on meloxicam 15 mg p.o. daily for right hip and knee joint pain.  Since starting the meloxicam he did notice improvement with his right hip pain actually feels like this was doing better and having more trouble of the knee than in the hip.  More recently had an episode where he he developed some left ankle pain and swelling but this resolved within a few weeks and not having a problem at the moment.  He had 1 episode of calcified nodule in the right elbow broke open wound was draining for about 3 days but this healed without any specific intervention.  Apparently his wife has noticed some increasing changes on the lateral side of his left foot but he does not notice any symptoms there.  Otherwise skin rash and nodules are about the same.   Previous HPI 10/02/2020 History of Present Illness: Evan Burns is a 43 y.o.  male with a history of dermatomyositis here for evaluation of myalgias and skin problems. He has a long history of DM diagnosed around 20 years ago originally with muscle and skin disease activity. He was treated with steroids for this with apparent resolution of his muscle inflammation. Since that time he has had continued development of calcifications that sometimes become swollen or infected but no known recurrence of muscular involvement. He takes treatments for asthma but no known ILD. Today he complains of joint pain in multiple sites the right groin, left ankle, and right shoulder. He has had previous imaging of the shoulder and hip showing right shoulder bursitis and  moderately advanced OA of the right hip. He has not had imaging for the left ankle pain. He does see swelling around the left ankle sometimes and worse after working all day. He has not noticed any focal weakness except around his right hip and thigh. He denies cough, dyspnea, fevers, weight loss, or lymphadenopathy.   No Rheumatology ROS completed.   PMFS History:  Patient Active Problem List   Diagnosis Date Noted   Infrapatellar bursitis of right knee 05/30/2023   Voice hoarseness 11/15/2022   Asthma exacerbation 11/02/2022   Occipital lymphadenopathy 09/28/2022   Cough variant asthma 09/01/2022   Seasonal and perennial allergic rhinitis 09/01/2022   Asthmatic bronchitis 08/23/2022   Facial rash 03/09/2022   COVID-19 01/08/2022   Vitamin D deficiency 03/19/2021   Lower leg edema 01/09/2021   Pain in left ankle and joints of left foot 10/02/2020   Calcinosis cutis 10/02/2020   Arthritis of right hip 09/16/2020   Tinea cruris 07/28/2020   Leg pain, bilateral 07/28/2020   Prediabetes 03/19/2020   Chest mass 02/14/2019   Other chest pain 02/14/2019   Superficial phlebitis, chest wall 02/14/2019   Fatigue 11/22/2018   Cough 11/08/2015   Possible GERD 10/29/2010   Essential hypertension 10/12/2010   Dermatomyositis (HCC) 06/17/2008   Asthma 02/22/2007    Past Medical History:  Diagnosis Date   Arthritis of right hip 09/16/2020   X-ray October 2021   Asthma    Dermatomyositis (HCC)    Hypertension     Family History  Problem Relation Age of Onset   Asthma Mother    Hypertension Mother    Diabetes Mother    Kidney disease Mother    Hyperlipidemia Mother    Arthritis Mother    Hypertension Father    Hyperlipidemia Father    Past Surgical History:  Procedure Laterality Date   HAND SURGERY     INCISION AND DRAINAGE OF WOUND     MASS EXCISION Left 07/27/2019   Procedure: LEFT LONG FINGER NODULE EXCISION;  Surgeon: Tarry Kos, MD;  Location: [facility];  Service: Orthopedics;  Laterality: Left;   TYMPANOSTOMY TUBE PLACEMENT     Social History   Social History Narrative   Pt lives at home w/ parents       Very active at work - works two jobs   Immunization History  Administered Date(s) Administered   Influenza Split 08/25/2011, 07/25/2012   Influenza Whole 08/08/2007, 08/14/2008, 07/23/2009, 07/21/2010   Influenza, Mdck, Trivalent,PF 6+ MOS(egg free) 07/18/2023   Influenza,inj,Quad PF,6+ Mos 07/24/2013, 07/23/2014, 07/01/2015, 07/05/2016, 08/06/2017, 08/04/2018, 09/07/2019, 07/28/2020, 07/03/2021, 07/26/2022   PFIZER Comirnaty(Gray Top)Covid-19 Tri-Sucrose Vaccine 08/06/2022   PFIZER(Purple Top)SARS-COV-2 Vaccination 01/07/2020, 01/28/2020, 09/22/2020   PNEUMOCOCCAL CONJUGATE-20 10/14/2023   Pfizer Covid-19 Vaccine Bivalent Booster 52yrs & up 08/04/2021   Pneumococcal Conjugate-13 09/21/2021  Pneumococcal Polysaccharide-23 10/01/2014   Tdap 09/13/2013, 04/18/2019     Objective: Vital Signs: There were no vitals taken for this visit.   Physical Exam   Musculoskeletal Exam: ***  CDAI Exam: CDAI Score: -- Patient Global: --; Provider Global: -- Swollen: --; Tender: -- Joint Exam 01/23/2024   No joint exam has been documented for this visit   There is currently no information documented on the homunculus. Go to the Rheumatology activity and complete the homunculus joint exam.  Investigation: No additional findings.  Imaging: No results found.  Recent Labs: Lab Results  Component Value Date   WBC 5.3 09/28/2022   HGB 13.9 09/28/2022   PLT 238.0 09/28/2022   NA 141 05/30/2023   K 4.4 05/30/2023   CL 105 05/30/2023   CO2 28 05/30/2023   GLUCOSE 81 05/30/2023   BUN 15 05/30/2023   CREATININE 1.12 05/30/2023   BILITOT 0.5 05/30/2023   ALKPHOS 46 09/28/2022   AST 22 05/30/2023   ALT 23 05/30/2023   PROT 7.0 05/30/2023   ALBUMIN 4.3 09/28/2022   CALCIUM 9.5 05/30/2023   GFRAA  02/01/2010    >60         The eGFR has been calculated using the MDRD equation. This calculation has not been validated in all clinical situations. eGFR's persistently <60 mL/min signify possible Chronic Kidney Disease.    Speciality Comments: No specialty comments available.  Procedures:  No procedures performed Allergies: Sulfonamide derivatives, Cephalexin, and Lisinopril   Assessment / Plan:     Visit Diagnoses: No diagnosis found.  ***  Orders: No orders of the defined types were placed in this encounter.  No orders of the defined types were placed in this encounter.    Follow-Up Instructions: No follow-ups on file.   Metta Clines, RT  Note - This record has been created using [employer].  Chart creation errors have been sought, but may not always  have been located. Such creation errors do not reflect on  the standard of medical care.

## 2024-01-16 DIAGNOSIS — M25561 Pain in right knee: Secondary | ICD-10-CM | POA: Diagnosis not present

## 2024-01-23 ENCOUNTER — Ambulatory Visit: Payer: Federal, State, Local not specified - PPO | Admitting: Internal Medicine

## 2024-01-23 DIAGNOSIS — M3313 Other dermatomyositis without myopathy: Secondary | ICD-10-CM

## 2024-01-23 DIAGNOSIS — Z79899 Other long term (current) drug therapy: Secondary | ICD-10-CM

## 2024-01-23 DIAGNOSIS — M1611 Unilateral primary osteoarthritis, right hip: Secondary | ICD-10-CM

## 2024-01-23 DIAGNOSIS — M7051 Other bursitis of knee, right knee: Secondary | ICD-10-CM

## 2024-01-31 DIAGNOSIS — M7651 Patellar tendinitis, right knee: Secondary | ICD-10-CM | POA: Diagnosis not present

## 2024-01-31 DIAGNOSIS — M25561 Pain in right knee: Secondary | ICD-10-CM | POA: Diagnosis not present

## 2024-02-21 NOTE — Progress Notes (Signed)
 Subjective:    Patient ID: Evan Burns, male    DOB: [birth year], 43 y.o.   MRN: [id number]     HPI Evan Burns is here for follow up of his chronic medical problems.    Medications and allergies reviewed with patient and updated if appropriate.  Current Outpatient Medications on File Prior to Visit  Medication Sig Dispense Refill  . albuterol  (VENTOLIN  HFA) 108 (90 Base) MCG/ACT inhaler Inhale 2 puffs into the lungs every 6 (six) hours as needed for wheezing or shortness of breath. 18 g 11  . Azelastine -Fluticasone  137-50 MCG/ACT SUSP Place 1 spray into the nose in the morning and at bedtime. 23 g 3  . Blood Pressure Monitoring (BLOOD PRESSURE MONITOR/S CUFF) MISC UAD to check BP at home  I10 1 each 0  . Butenafine  HCl 1 % cream Apply twice daily to affected area 30 g 0  . COD LIVER OIL PO Take 1 capsule by mouth daily.    . Creatine POWD Take 1 Scoop by mouth.    . diltiazem  (CARDIZEM  CD) 360 MG 24 hr capsule TAKE 1 CAPSULE BY MOUTH DAILY 90 capsule 1  . fluticasone  (FLONASE ) 50 MCG/ACT nasal spray Place 2 sprays into both nostrils daily. 16 g 6  . Fluticasone -Umeclidin-Vilant (TRELEGY ELLIPTA ) 200-62.5-25 MCG/ACT AEPB Inhale 1 puff into the lungs daily. Rinse mouth after each use. 60 each 3  . hydrocortisone 2.5 % cream Apply topically.    Evan Burns levocetirizine (XYZAL ) 5 MG tablet Take 1 tablet (5 mg total) by mouth every evening. 30 tablet 3  . meloxicam  (MOBIC ) 7.5 MG tablet Take by mouth with food daily or twice daily as needed 60 tablet 5  . mometasone (ELOCON) 0.1 % cream Apply topically.    . montelukast  (SINGULAIR ) 10 MG tablet Take 1 tablet (10 mg total) by mouth at bedtime. 30 tablet 3  . Omega-3 Fatty Acids (FISH OIL) 1000 MG CAPS Take 1,000 mg by mouth daily.    . omeprazole  (PRILOSEC) 20 MG capsule Take 1 capsule (20 mg total) by mouth daily. 30 capsule 3  . tacrolimus (PROTOPIC) 0.03 % ointment Apply 1 application topically 2 (two) times daily.    . triamcinolone   cream (KENALOG) 0.1 % Apply topically.    Evan Burns VITAMIN D PO Take by mouth daily.     No current facility-administered medications on file prior to visit.     Review of Systems     Objective:  There were no vitals filed for this visit. BP Readings from Last 3 Encounters:  10/13/23 124/88  08/24/23 116/74  05/30/23 138/85   Wt Readings from Last 3 Encounters:  10/13/23 243 lb 9.6 oz (110.5 kg)  08/24/23 240 lb (108.9 kg)  05/30/23 245 lb (111.1 kg)   There is no height or weight on file to calculate BMI.    Physical Exam     Lab Results  Component Value Date   WBC 5.3 09/28/2022   HGB 13.9 09/28/2022   HCT 43.1 09/28/2022   PLT 238.0 09/28/2022   GLUCOSE 81 05/30/2023   CHOL 121 03/20/2021   TRIG 74.0 03/20/2021   HDL 35.90 (L) 03/20/2021   LDLCALC 70 03/20/2021   ALT 23 05/30/2023   AST 22 05/30/2023   NA 141 05/30/2023   K 4.4 05/30/2023   CL 105 05/30/2023   CREATININE 1.12 05/30/2023   BUN 15 05/30/2023   CO2 28 05/30/2023   TSH 0.95 03/18/2020   HGBA1C 5.9 09/28/2022  Assessment & Plan:    See Problem List for Assessment and Plan of chronic medical problems.    This encounter was created in error - please disregard.

## 2024-02-21 NOTE — Patient Instructions (Addendum)
      Blood work was ordered.       Medications changes include :   None    A referral was ordered and someone will call you to schedule an appointment.     Return in about 6 months (around 08/23/2024) for Physical Exam.

## 2024-02-22 ENCOUNTER — Encounter: Payer: Federal, State, Local not specified - PPO | Admitting: Internal Medicine

## 2024-02-22 ENCOUNTER — Encounter: Payer: Self-pay | Admitting: Internal Medicine

## 2024-02-22 DIAGNOSIS — R7303 Prediabetes: Secondary | ICD-10-CM

## 2024-02-22 DIAGNOSIS — E559 Vitamin D deficiency, unspecified: Secondary | ICD-10-CM

## 2024-02-22 DIAGNOSIS — I10 Essential (primary) hypertension: Secondary | ICD-10-CM

## 2024-02-22 DIAGNOSIS — J4520 Mild intermittent asthma, uncomplicated: Secondary | ICD-10-CM

## 2024-02-22 DIAGNOSIS — K219 Gastro-esophageal reflux disease without esophagitis: Secondary | ICD-10-CM

## 2024-02-22 NOTE — Assessment & Plan Note (Signed)
 Chronic Following with allergy  On Trelegy daily and albuterol  prn Continue Singulair  10 mg nightly

## 2024-02-22 NOTE — Assessment & Plan Note (Signed)
 Chronic Probable GERD-concerned it was contributing to his asthma-like symptoms Continue omeprazole  20 mg daily

## 2024-02-22 NOTE — Assessment & Plan Note (Signed)
 Chronic Following with Dr. Rodell Citrin No longer on methotrexate 

## 2024-02-22 NOTE — Assessment & Plan Note (Signed)
 Chronic Taking vitamin D daily Check vitamin D level

## 2024-02-22 NOTE — Assessment & Plan Note (Signed)
 Chronic Blood pressure controlled Continue diltiazem  360 mg daily, losartan -hydrochlorothiazide  100-25 mg daily CBC, CMP

## 2024-02-22 NOTE — Assessment & Plan Note (Signed)
 Chronic Check a1c Low sugar / carb diet Stressed regular exercise  Lab Results  Component Value Date   HGBA1C 5.9 09/28/2022

## 2024-02-25 ENCOUNTER — Other Ambulatory Visit: Payer: Self-pay | Admitting: Internal Medicine

## 2024-03-13 NOTE — Progress Notes (Signed)
 Office Visit Note  Patient: Evan Burns             Date of Birth: 1980-11-25           MRN: [id number]             PCP: Colene Dauphin, MD Referring: Colene Dauphin, MD Visit Date: 03/27/2024   Subjective:  Follow-up (Right knee)    Discussed the use of AI scribe software for clinical note transcription with the patient, who gave verbal consent to proceed.  History of Present Illness   Evan Burns is a 43 y.o. 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 He presents with worsening knee pain due to patellar tendonitis.  He experiences significant knee pain, described as 'crucial,' primarily located at the patellar tendon. The pain is severe enough to disturb his sleep at night and early morning. Various treatments have been attempted, including Voltaren  gel, which was ineffective, and a brace that provided some relief but was too bulky. Oral diclofenac  and steroids offered temporary relief, and a cortisone shot was administered but did not last as long as expected.  He experiences difficulty with certain movements, such as lifting his leg, which he attributes to weakness in the hip flexors. Occasionally, he experiences hip pain, though it is not as severe as the knee pain. The hip pain was more prominent when he first started swimming but has since decreased.  He sometimes experiences discomfort while driving, particularly in his wife's car, which he attributes to the angle of his knee during driving. He has not been kneeling on the affected knee frequently. He occasionally takes Aleve  for general aches, particularly after gym workouts, but tries to limit its use.  No significant changes in his skin, noting only some dryness and irritation during the winter.      Previous HPI 05/30/2023 Evan Burns is a 43 y.o. male here for follow up for dermatomyositis with chronic  calcinosis cutis and osteoarthritis on meloxicam  7.5 mg 1-2 times daily as needed.  After last visit recommended evaluation at [facility] dermatology with Dr. Jertson but apparently appointment was never scheduled.  He is continue taking the meloxicam  7.5 mg with a pretty good benefit.  Still has right hip pain worse while weightbearing not severely limiting his activities.  He was seen for nodule swelling around the right eyelid had a biopsy for this in May consistent with calcinosis cutis and no recurrence since removal.  He is not having any pain with his other nodules unless he triggers this such as getting too much calcium.  More recently has complaint with right knee pain that has been acting up just in the past few weeks did not recall any preceding injury activity change and there is no associated swelling.   Previous HPI 09/13/2022 Evan Burns is a 43 y.o. 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  Currently has some right hip pain in the groin and down to the medial knee has not bothered him a bit more than usual for about 2 days.  Intermittently has worsening pain in this distribution.  The meloxicam  is pretty helpful but is usually taking less than once daily on average.  Skin rashes have remained pretty stable and he has not noticed specific new nodules.  Still having some  changes at the lateral foot.  He has noticed a couple areas getting sensitive or having raised or irritated bumps at the right elbow nodules but no drainage or redness.   Previous HPI 06/14/2022 Evan Burns is a 43 y.o. male here for follow up for dermatomyositis with chronic calcinosis cutis currently on methotrexate  20 mg p.o. weekly folic acid  1 mg daily and is also on meloxicam  15 mg p.o. daily for right hip and knee joint pain.  Since starting the meloxicam  he did notice improvement with his right hip pain actually  feels like this was doing better and having more trouble of the knee than in the hip.  More recently had an episode where he he developed some left ankle pain and swelling but this resolved within a few weeks and not having a problem at the moment.  He had 1 episode of calcified nodule in the right elbow broke open wound was draining for about 3 days but this healed without any specific intervention.  Apparently his wife has noticed some increasing changes on the lateral side of his left foot but he does not notice any symptoms there.  Otherwise skin rash and nodules are about the same.   Previous HPI 10/02/2020 History of Present Illness: Evan Burns is a 43 y.o. male with a history of dermatomyositis here for evaluation of myalgias and skin problems. He has a long history of DM diagnosed around 20 years ago originally with muscle and skin disease activity. He was treated with steroids for this with apparent resolution of his muscle inflammation. Since that time he has had continued development of calcifications that sometimes become swollen or infected but no known recurrence of muscular involvement. He takes treatments for asthma but no known ILD. Today he complains of joint pain in multiple sites the right groin, left ankle, and right shoulder. He has had previous imaging of the shoulder and hip showing right shoulder bursitis and moderately advanced OA of the right hip. He has not had imaging for the left ankle pain. He does see swelling around the left ankle sometimes and worse after working all day. He has not noticed any focal weakness except around his right hip and thigh. He denies cough, dyspnea, fevers, weight loss, or lymphadenopathy.   Review of Systems  Constitutional:  Negative for fatigue.  HENT:  Negative for mouth sores and mouth dryness.   Eyes:  Negative for dryness.  Respiratory:  Negative for shortness of breath.   Cardiovascular:  Negative for chest pain and palpitations.   Gastrointestinal:  Negative for blood in stool, constipation and diarrhea.  Endocrine: Negative for increased urination.  Genitourinary:  Negative for involuntary urination.  Musculoskeletal:  Positive for joint pain, joint pain, joint swelling, myalgias, muscle weakness, morning stiffness, muscle tenderness and myalgias. Negative for gait problem.  Skin:  Negative for color change, rash, hair loss and sensitivity to sunlight.  Allergic/Immunologic: Negative for susceptible to infections.  Neurological:  Positive for headaches. Negative for dizziness.  Hematological:  Negative for swollen glands.  Psychiatric/Behavioral:  Negative for depressed mood and sleep disturbance. The patient is not nervous/anxious.     PMFS History:  Patient Active Problem List   Diagnosis Date Noted   Infrapatellar bursitis of right knee 05/30/2023   Voice hoarseness 11/15/2022   Asthma exacerbation 11/02/2022   Occipital lymphadenopathy 09/28/2022   Cough variant asthma 09/01/2022   Seasonal and perennial allergic rhinitis 09/01/2022   Asthmatic bronchitis 08/23/2022   Facial rash 03/09/2022  COVID-19 01/08/2022   Vitamin D deficiency 03/19/2021   Lower leg edema 01/09/2021   Pain in left ankle and joints of left foot 10/02/2020   Calcinosis cutis 10/02/2020   Arthritis of right hip 09/16/2020   Tinea cruris 07/28/2020   Leg pain, bilateral 07/28/2020   Prediabetes 03/19/2020   Chest mass 02/14/2019   Other chest pain 02/14/2019   Superficial phlebitis, chest wall 02/14/2019   Fatigue 11/22/2018   Cough 11/08/2015   Possible GERD 10/29/2010   Essential hypertension 10/12/2010   Dermatomyositis (HCC) 06/17/2008   Asthma 02/22/2007    Past Medical History:  Diagnosis Date   Arthritis of right hip 09/16/2020   X-ray October 2021   Asthma    Dermatomyositis (HCC)    Hypertension     Family History  Problem Relation Age of Onset   Asthma Mother    Hypertension Mother    Diabetes Mother     Kidney disease Mother    Hyperlipidemia Mother    Arthritis Mother    Hypertension Father    Hyperlipidemia Father    Past Surgical History:  Procedure Laterality Date   HAND SURGERY     INCISION AND DRAINAGE OF WOUND     MASS EXCISION Left 07/27/2019   Procedure: LEFT LONG FINGER NODULE EXCISION;  Surgeon: Wes Hamman, MD;  Location: [facility];  Service: Orthopedics;  Laterality: Left;   TYMPANOSTOMY TUBE PLACEMENT     Social History   Social History Narrative   Pt lives at home w/ parents       Very active at work - works two jobs   Immunization History  Administered Date(s) Administered   Influenza Split 08/25/2011, 07/25/2012   Influenza Whole 08/08/2007, 08/14/2008, 07/23/2009, 07/21/2010   Influenza, Mdck, Trivalent,PF 6+ MOS(egg free) 07/18/2023   Influenza,inj,Quad PF,6+ Mos 07/24/2013, 07/23/2014, 07/01/2015, 07/05/2016, 08/06/2017, 08/04/2018, 09/07/2019, 07/28/2020, 07/03/2021, 07/26/2022   PFIZER Comirnaty(Gray Top)Covid-19 Tri-Sucrose Vaccine 08/06/2022   PFIZER(Purple Top)SARS-COV-2 Vaccination 01/07/2020, 01/28/2020, 09/22/2020   PNEUMOCOCCAL CONJUGATE-20 10/14/2023   Pfizer Covid-19 Vaccine Bivalent Booster 42yrs & up 08/04/2021   Pneumococcal Conjugate-13 09/21/2021   Pneumococcal Polysaccharide-23 10/01/2014   Tdap 09/13/2013, 04/18/2019     Objective: Vital Signs: BP 123/79 (BP Location: Left Arm, Patient Position: Sitting, Cuff Size: Normal)   Pulse 80   Resp 16   Ht 5' 7 (1.702 m)   Wt 257 lb 3.2 oz (116.7 kg)   BMI 40.28 kg/m    Physical Exam  Cardiovascular:     Rate and Rhythm: Normal rate and regular rhythm.  Pulmonary:     Effort: Pulmonary effort is normal.     Breath sounds: Normal breath sounds.   Skin:    General: Skin is warm and dry.     Comments: Extensive subcutaneous nodules in bilateral upper extremities from elbows to hands, diffuse skin hyperpigmentation   Neurological:     Mental Status: He is alert.    Psychiatric:        Mood and Affect: Mood normal.      Musculoskeletal Exam:  Hip normal internal and external rotation without pain, no tenderness to lateral hip palpation Tenderness in the right knee patellar tendon area. Weakness in right hip flexor. Right knee extension strength is adequate.  Ankles full ROM no tenderness or swelling    Investigation: No additional findings.  Imaging: No results found.  Recent Labs: Lab Results  Component Value Date   WBC 5.3 09/28/2022   HGB 13.9 09/28/2022  PLT 238.0 09/28/2022   NA 141 05/30/2023   K 4.4 05/30/2023   CL 105 05/30/2023   CO2 28 05/30/2023   GLUCOSE 81 05/30/2023   BUN 15 05/30/2023   CREATININE 1.12 05/30/2023   BILITOT 0.5 05/30/2023   ALKPHOS 46 09/28/2022   AST 22 05/30/2023   ALT 23 05/30/2023   PROT 7.0 05/30/2023   ALBUMIN 4.3 09/28/2022   CALCIUM 9.5 05/30/2023   GFRAA  02/01/2010    >60        The eGFR has been calculated using the MDRD equation. This calculation has not been validated in all clinical situations. eGFR's persistently <60 mL/min signify possible Chronic Kidney Disease.    Speciality Comments: No specialty comments available.  Procedures:  No procedures performed Allergies: Sulfonamide derivatives, Cephalexin, and Lisinopril   Assessment / Plan:     Visit Diagnoses: Dermatomyositis [facility]) - will refer to [facility]ology Dr. Jorrizzo, referral placed 09/24/2022 Skin rashes and nodular changes stable without particular exacerbation or new complaint.  Only area of mild weakness is in the hip but with asymmetric distribution definitely seems more related to his severe underlying osteoarthritis not concerning for inflammatory myopathy.  Patellar Tendonitis Chronic patellar tendonitis with nocturnal and morning pain. Previous treatments provided temporary relief. Knee integrity intact, indicating compensatory issues from hip weakness. Discussed short-term diclofenac  use  and its risks. Emphasized hip strengthening to reduce knee strain. - Prescribe diclofenac  1-2 pills daily for 2-4 weeks. - Provide hip flexor strengthening exercises: straight leg raises, side leg raises. - Advise against long-term diclofenac  use due to gastrointestinal risks. - Recommend avoiding kneeling or using knee pads. - Suggest using a cushion or towel under the leg while driving.  Hip Arthritis Chronic hip arthritis with right hip flexor weakness, contributing to knee strain and patellar tendonitis. Emphasized hip flexor strengthening to improve knee function. Strengthening exercises beneficial despite limited range of motion. - Recommend hip flexor strengthening exercises: straight leg raises, dip machine leg lifts. - Consider physical therapy referral if no improvement after a month.       Orders: No orders of the defined types were placed in this encounter.  No orders of the defined types were placed in this encounter.    Follow-Up Instructions: No follow-ups on file.   Matt Song, MD  Note - This record has been created using [employer].  Chart creation errors have been sought, but may not always  have been located. Such creation errors do not reflect on  the standard of medical care.

## 2024-03-20 ENCOUNTER — Ambulatory Visit: Payer: Self-pay

## 2024-03-20 NOTE — Telephone Encounter (Signed)
 Patient's voicemail full.  Spoke with patient's mom today and he should follow up with Ortho regarding his knee since they are currently already treating him for his knee pain.

## 2024-03-20 NOTE — Telephone Encounter (Signed)
 This RN was able to connect with Vertie Etchison (on DPR), patient's mother. Vertie is requesting a prescription for Voltaren. Vertie stated the cream/ointment that the patient is currently using for pain in his knees and ankles is not working. Vertie stated patient's provider is aware of this medical condition. Advised that I would route request to provider. Vertie verbalized understanding. Please advise, thank you!  Copied from CRM [id number]. Topic: Clinical - Medication Question >> Mar 20, 2024 10:24 AM [place] J wrote: Reason for CRM: Patient's mother (Vertie Campton) was wondering if it would be okay for the patient to take valsartan.   Please call Vertie Stthomas with an update: [phone] Reason for Disposition  Prescription request for new medicine (not a refill)  Protocols used: Medication Refill and Renewal Call-A-AH

## 2024-03-27 ENCOUNTER — Encounter: Payer: Self-pay | Admitting: Internal Medicine

## 2024-03-27 ENCOUNTER — Ambulatory Visit: Attending: Internal Medicine | Admitting: Internal Medicine

## 2024-03-27 VITALS — BP 123/79 | HR 80 | Resp 16 | Ht 67.0 in | Wt 257.2 lb

## 2024-03-27 DIAGNOSIS — M3313 Other dermatomyositis without myopathy: Secondary | ICD-10-CM | POA: Diagnosis not present

## 2024-03-27 DIAGNOSIS — Z79899 Other long term (current) drug therapy: Secondary | ICD-10-CM | POA: Diagnosis not present

## 2024-03-27 DIAGNOSIS — M1611 Unilateral primary osteoarthritis, right hip: Secondary | ICD-10-CM

## 2024-03-27 DIAGNOSIS — M7051 Other bursitis of knee, right knee: Secondary | ICD-10-CM | POA: Diagnosis not present

## 2024-03-27 MED ORDER — DICLOFENAC SODIUM 75 MG PO TBEC: DELAYED_RELEASE_TABLET | ORAL | 0 refills | Status: AC

## 2024-03-27 NOTE — Patient Instructions (Signed)
 Hamstring, doorway stretch This is an exercise in which you lie in a doorway and prop your leg on a wall to stretch the back of your knee and thigh (hamstring). Lie on your back in front of a doorway with your left / right leg resting against the wall and your other leg flat on the floor in the doorway. There should be a slight bend in your left / right knee. Straighten your left / right knee. You should feel a stretch behind your knee or thigh. If you do not, scoot your buttocks closer to the door. Hold this position for __________ seconds. Repeat __________ times. Complete this exercise __________ times a day.  Straight leg raises This exercise strengthens the muscles in front of your thigh (quadriceps) and the muscles that move your hips (hip flexors). Lie on your back with your left / right leg extended and your other knee bent. Tense the muscles in the front of your left / right thigh. You should see your kneecap slide up or see increased dimpling just above the knee. Your thigh may even shake a bit. Keep these muscles tight as you raise your leg 4-6 inches (10-15 cm) off the floor. Do not let your knee bend. Hold this position for __________ seconds. Keep these muscles tense as you lower your leg. Relax your muscles slowly and completely after each repetition. Repeat __________ times. Complete this exercise __________ times a day. Straight leg raises, side-lying This exercise strengthens the muscles that rotate the leg at the hip and move it away from your body (hip abductors). Lie on your side with your left / right leg in the top position. Lie so your head, shoulder, knee, and hip line up. You may bend your bottom knee to help you keep your balance. Roll your hips slightly forward so your hips are stacked directly over each other and your left / right knee is facing forward. Leading with your heel, lift your top leg 4-6 inches (10-15 cm). You should feel the muscles in your outer hip  lifting. Do not let your foot drift forward. Do not let your knee roll toward the ceiling. Hold this position for __________ seconds. Slowly return your leg to the starting position. Let your muscles relax completely after each repetition. Repeat __________ times. Complete this exercise __________ times a day.

## 2024-03-29 ENCOUNTER — Other Ambulatory Visit: Payer: Self-pay | Admitting: Internal Medicine

## 2024-04-16 ENCOUNTER — Telehealth: Payer: Self-pay | Admitting: Internal Medicine

## 2024-04-16 NOTE — Telephone Encounter (Unsigned)
 Copied from CRM [id number]. Topic: General - Other >> Apr 16, 2024 12:48 PM Suzen RAMAN wrote: Reason for CRM: Patient mother would like to know if its time for patient to have his pneumonia booster shot. Please contact to advise CB# [phone]

## 2024-04-17 NOTE — Telephone Encounter (Signed)
 Copied from CRM [id number]. Topic: General - Other >> Apr 17, 2024  8:52 AM Berneda FALCON wrote: Reason for CRM: Mother Reinaldo) would like to know if Codylee can get pneumonia booster shot tomorrow since he is bringing his father to another appt for a B12 and booster pneumonia shot as well. She would really like him to go ahead and get it. Father's appt is at 8:20 AM tomorrow.  States she called yesterday and did not get a callback   Please contact Vertie [phone]

## 2024-04-17 NOTE — Telephone Encounter (Signed)
 Spoke with patient's mom today.  He is not due for vaccine as last one was 10/14/23.  He is however due for follow up or physical and info given to him mom for patient to contact office or schedule when he comes in tomorrow.

## 2024-05-15 DIAGNOSIS — M21622 Bunionette of left foot: Secondary | ICD-10-CM | POA: Diagnosis not present

## 2024-05-21 ENCOUNTER — Ambulatory Visit (INDEPENDENT_AMBULATORY_CARE_PROVIDER_SITE_OTHER): Admitting: Internal Medicine

## 2024-05-21 VITALS — BP 122/80 | HR 72 | Temp 98.2°F | Ht 67.0 in | Wt 258.0 lb

## 2024-05-21 DIAGNOSIS — Z0000 Encounter for general adult medical examination without abnormal findings: Secondary | ICD-10-CM | POA: Diagnosis not present

## 2024-05-21 DIAGNOSIS — E559 Vitamin D deficiency, unspecified: Secondary | ICD-10-CM

## 2024-05-21 DIAGNOSIS — R7303 Prediabetes: Secondary | ICD-10-CM

## 2024-05-21 DIAGNOSIS — Z125 Encounter for screening for malignant neoplasm of prostate: Secondary | ICD-10-CM | POA: Diagnosis not present

## 2024-05-21 DIAGNOSIS — I10 Essential (primary) hypertension: Secondary | ICD-10-CM

## 2024-05-21 DIAGNOSIS — J4520 Mild intermittent asthma, uncomplicated: Secondary | ICD-10-CM

## 2024-05-21 LAB — CBC WITH DIFFERENTIAL/PLATELET
Basophils Absolute: 0.1 K/uL (ref 0.0–0.1)
Basophils Relative: 2.1 % (ref 0.0–3.0)
Eosinophils Absolute: 0.2 K/uL (ref 0.0–0.7)
Eosinophils Relative: 4.1 % (ref 0.0–5.0)
HCT: 41 % (ref 39.0–52.0)
Hemoglobin: 13.4 g/dL (ref 13.0–17.0)
Lymphocytes Relative: 26.5 % (ref 12.0–46.0)
Lymphs Abs: 1.4 K/uL (ref 0.7–4.0)
MCHC: 32.7 g/dL (ref 30.0–36.0)
MCV: 82.5 fl (ref 78.0–100.0)
Monocytes Absolute: 0.5 K/uL (ref 0.1–1.0)
Monocytes Relative: 9.5 % (ref 3.0–12.0)
Neutro Abs: 3 K/uL (ref 1.4–7.7)
Neutrophils Relative %: 57.8 % (ref 43.0–77.0)
Platelets: 251 K/uL (ref 150.0–400.0)
RBC: 4.97 Mil/uL (ref 4.22–5.81)
RDW: 13.2 % (ref 11.5–15.5)
WBC: 5.2 K/uL (ref 4.0–10.5)

## 2024-05-21 LAB — COMPREHENSIVE METABOLIC PANEL WITH GFR
ALT: 30 U/L (ref 0–53)
AST: 27 U/L (ref 0–37)
Albumin: 4.6 g/dL (ref 3.5–5.2)
Alkaline Phosphatase: 40 U/L (ref 39–117)
BUN: 22 mg/dL (ref 6–23)
CO2: 30 meq/L (ref 19–32)
Calcium: 9.1 mg/dL (ref 8.4–10.5)
Chloride: 101 meq/L (ref 96–112)
Creatinine, Ser: 1.11 mg/dL (ref 0.40–1.50)
GFR: 82.02 mL/min (ref >60.00)
Glucose, Bld: 87 mg/dL (ref 70–99)
Potassium: 3.8 meq/L (ref 3.5–5.1)
Sodium: 140 meq/L (ref 135–145)
Total Bilirubin: 0.5 mg/dL (ref 0.2–1.2)
Total Protein: 6.7 g/dL (ref 6.0–8.3)

## 2024-05-21 LAB — LIPID PANEL
Cholesterol: 128 mg/dL (ref 0–200)
HDL: 37.6 mg/dL — ABNORMAL LOW (ref >39.00)
LDL Cholesterol: 78 mg/dL (ref 0–99)
NonHDL: 90.49
Total CHOL/HDL Ratio: 3
Triglycerides: 63 mg/dL (ref 0.0–149.0)
VLDL: 12.6 mg/dL (ref 0.0–40.0)

## 2024-05-21 LAB — HEMOGLOBIN A1C: Hgb A1c MFr Bld: 5.8 % (ref 4.6–6.5)

## 2024-05-21 NOTE — Assessment & Plan Note (Signed)
 Chronic Check a1c Low sugar / carb diet Stressed regular exercise  Lab Results  Component Value Date   HGBA1C 5.9 09/28/2022

## 2024-05-21 NOTE — Progress Notes (Signed)
 Subjective:    Patient ID: Evan Burns, male    DOB: 26-Feb-1981, 43 y.o.   MRN: [id number]     HPI Evan Burns is here for a physical exam and his chronic medical problems.    Asthma-saw allergy  last year-started on Trelegy - controlled   Sometimes has headaches.  Lately not too frequent - previously it was more frequent.  He is taking his BP meds same time every day and that has helped - ? BP related.    Typically gets 3-3.5 hrs of sleep at night.  Works two jobs.    Medications and allergies reviewed with patient and updated if appropriate.  Current Outpatient Medications on File Prior to Visit  Medication Sig Dispense Refill   albuterol  (VENTOLIN  HFA) 108 (90 Base) MCG/ACT inhaler Inhale 2 puffs into the lungs every 6 (six) hours as needed for wheezing or shortness of breath. 18 g 11   Azelastine -Fluticasone  137-50 MCG/ACT SUSP Place 1 spray into the nose in the morning and at bedtime. 23 g 3   Blood Pressure Monitoring (BLOOD PRESSURE MONITOR/S CUFF) MISC UAD to check BP at home  I10 1 each 0   Butenafine  HCl 1 % cream Apply twice daily to affected area 30 g 0   COD LIVER OIL PO Take 1 capsule by mouth daily.     Creatine POWD Take 1 Scoop by mouth.     diclofenac  (VOLTAREN ) 75 MG EC tablet 1-2 times daily as needed for joint pain 60 tablet 0   diltiazem  (CARDIZEM  CD) 360 MG 24 hr capsule TAKE 1 CAPSULE BY MOUTH DAILY 90 capsule 0   fluticasone  (FLONASE ) 50 MCG/ACT nasal spray Place 2 sprays into both nostrils daily. 16 g 6   Fluticasone -Umeclidin-Vilant (TRELEGY ELLIPTA ) 200-62.5-25 MCG/ACT AEPB Inhale 1 puff into the lungs daily. Rinse mouth after each use. 60 each 3   hydrocortisone 2.5 % cream Apply topically.     levocetirizine (XYZAL ) 5 MG tablet Take 1 tablet (5 mg total) by mouth every evening. (Patient taking differently: Take 5 mg by mouth as needed.) 30 tablet 3   losartan -hydrochlorothiazide  (HYZAAR) 100-25 MG tablet TAKE 1 TABLET BY MOUTH DAILY 90 tablet 0    mometasone (ELOCON) 0.1 % cream Apply topically.     montelukast  (SINGULAIR ) 10 MG tablet Take 1 tablet (10 mg total) by mouth at bedtime. 30 tablet 3   Omega-3 Fatty Acids (FISH OIL) 1000 MG CAPS Take 1,000 mg by mouth daily.     omeprazole  (PRILOSEC) 20 MG capsule Take 1 capsule (20 mg total) by mouth daily. 30 capsule 3   tacrolimus (PROTOPIC) 0.03 % ointment Apply 1 application topically 2 (two) times daily.     triamcinolone  cream (KENALOG) 0.1 % Apply topically.     VITAMIN D  PO Take by mouth daily.     No current facility-administered medications on file prior to visit.    Review of Systems  Constitutional:  Positive for fatigue. Negative for fever.  Eyes:  Negative for visual disturbance.  Respiratory:  Positive for cough (occ). Negative for shortness of breath and wheezing.   Cardiovascular:  Negative for chest pain, palpitations and leg swelling.  Gastrointestinal:  Positive for nausea (occ - if he is hungry or shortly after waking up). Negative for abdominal pain, blood in stool, constipation and diarrhea.       Gerd controlled  Genitourinary:  Negative for difficulty urinating and dysuria.  Musculoskeletal:  Positive for arthralgias (right hip, knees, one finger). Negative  for back pain.  Skin:  Positive for rash.  Neurological:  Positive for dizziness (rare), speech difficulty and headaches. Negative for light-headedness.  Psychiatric/Behavioral:  Negative for dysphoric mood. The patient is not nervous/anxious.        Objective:   Vitals:   05/21/24 1318  BP: 122/80  Pulse: 72  Temp: 98.2 F (36.8 C)  SpO2: 98%   Filed Weights   05/21/24 1318  Weight: 258 lb (117 kg)   Body mass index is 40.41 kg/m.  BP Readings from Last 3 Encounters:  05/21/24 122/80  03/27/24 123/79  10/13/23 124/88    Wt Readings from Last 3 Encounters:  05/21/24 258 lb (117 kg)  03/27/24 257 lb 3.2 oz (116.7 kg)  10/13/23 243 lb 9.6 oz (110.5 kg)      Physical  Exam Constitutional: He appears well-developed and well-nourished. No distress.  HENT:  Head: Normocephalic and atraumatic.  Right Ear: External ear normal.  Left Ear: External ear normal.  Normal ear canals and TM b/l  Mouth/Throat: Oropharynx is clear and moist. Eyes: Conjunctivae and EOM are normal.  Neck: Neck supple. No tracheal deviation present. No thyromegaly present.  No carotid bruit  Cardiovascular: Normal rate, regular rhythm, normal heart sounds and intact distal pulses.   No murmur heard.  No lower extremity edema. Pulmonary/Chest: Effort normal and breath sounds normal. No respiratory distress. He has no wheezes. He has no rales.  Abdominal: Soft. He exhibits no distension. There is no tenderness.  Genitourinary: deferred  Lymphadenopathy:   He has no cervical adenopathy.  Skin: Skin is warm and dry. He is not diaphoretic.  Psychiatric: He has a normal mood and affect. His behavior is normal.         Assessment & Plan:   Physical exam: Screening blood work  ordered Exercise  going to gym - does weights -- advised doing more cardio Weight  advised weight loss Substance abuse   none Encouraged him to get more sleep  Reviewed recommended immunizations.   Health Maintenance  Topic Date Due   Hepatitis B Vaccines (1 of 3 - 19+ 3-dose series) Never done   HPV VACCINES (1 - Risk 3-dose SCDM series) Never done   COVID-19 Vaccine (6 - 2024-25 season) 06/26/2023   INFLUENZA VACCINE  05/25/2024   DTaP/Tdap/Td (3 - Td or Tdap) 04/17/2029   Pneumococcal Vaccine 9-30 Years old  Completed   Hepatitis C Screening  Completed   HIV Screening  Completed   Meningococcal B Vaccine  Aged Out     See Problem List for Assessment and Plan of chronic medical problems.

## 2024-05-21 NOTE — Patient Instructions (Addendum)
 Blood work was ordered.       Medications changes include :   None     Return in about 6 months (around 11/21/2024) for follow up.    Health Maintenance, Male Adopting a healthy lifestyle and getting preventive care are important in promoting health and wellness. Ask your health care provider about: The right schedule for you to have regular tests and exams. Things you can do on your own to prevent diseases and keep yourself healthy. What should I know about diet, weight, and exercise? Eat a healthy diet  Eat a diet that includes plenty of vegetables, fruits, low-fat dairy products, and lean protein. Do not eat a lot of foods that are high in solid fats, added sugars, or sodium. Maintain a healthy weight Body mass index (BMI) is a measurement that can be used to identify possible weight problems. It estimates body fat based on height and weight. Your health care provider can help determine your BMI and help you achieve or maintain a healthy weight. Get regular exercise Get regular exercise. This is one of the most important things you can do for your health. Most adults should: Exercise for at least 150 minutes each week. The exercise should increase your heart rate and make you sweat (moderate-intensity exercise). Do strengthening exercises at least twice a week. This is in addition to the moderate-intensity exercise. Spend less time sitting. Even light physical activity can be beneficial. Watch cholesterol and blood lipids Have your blood tested for lipids and cholesterol at 42 years of age, then have this test every 5 years. You may need to have your cholesterol levels checked more often if: Your lipid or cholesterol levels are high. You are older than 42 years of age. You are at high risk for heart disease. What should I know about cancer screening? Many types of cancers can be detected early and may often be prevented. Depending on your health history and family  history, you may need to have cancer screening at various ages. This may include screening for: Colorectal cancer. Prostate cancer. Skin cancer. Lung cancer. What should I know about heart disease, diabetes, and high blood pressure? Blood pressure and heart disease High blood pressure causes heart disease and increases the risk of stroke. This is more likely to develop in people who have high blood pressure readings or are overweight. Talk with your health care provider about your target blood pressure readings. Have your blood pressure checked: Every 3-5 years if you are 103-52 years of age. Every year if you are 26 years old or older. If you are between the ages of 52 and 20 and are a current or former smoker, ask your health care provider if you should have a one-time screening for abdominal aortic aneurysm (AAA). Diabetes Have regular diabetes screenings. This checks your fasting blood sugar level. Have the screening done: Once every three years after age 65 if you are at a normal weight and have a low risk for diabetes. More often and at a younger age if you are overweight or have a high risk for diabetes. What should I know about preventing infection? Hepatitis B If you have a higher risk for hepatitis B, you should be screened for this virus. Talk with your health care provider to find out if you are at risk for hepatitis B infection. Hepatitis C Blood testing is recommended for: Everyone born from [birth year] through 1964. Anyone with known risk factors for hepatitis C. Sexually  transmitted infections (STIs) You should be screened each year for STIs, including gonorrhea and chlamydia, if: You are sexually active and are younger than 42 years of age. You are older than 42 years of age and your health care provider tells you that you are at risk for this type of infection. Your sexual activity has changed since you were last screened, and you are at increased risk for chlamydia or  gonorrhea. Ask your health care provider if you are at risk. Ask your health care provider about whether you are at high risk for HIV. Your health care provider may recommend a prescription medicine to help prevent HIV infection. If you choose to take medicine to prevent HIV, you should first get tested for HIV. You should then be tested every 3 months for as long as you are taking the medicine. Follow these instructions at home: Alcohol use Do not drink alcohol if your health care provider tells you not to drink. If you drink alcohol: Limit how much you have to 0-2 drinks a day. Know how much alcohol is in your drink. In the [place] one drink equals one 12 oz bottle of beer (355 mL), one 5 oz glass of wine (148 mL), or one 1 oz glass of hard liquor (44 mL). Lifestyle Do not use any products that contain nicotine or tobacco. These products include cigarettes, chewing tobacco, and vaping devices, such as e-cigarettes. If you need help quitting, ask your health care provider. Do not use street drugs. Do not share needles. Ask your health care provider for help if you need support or information about quitting drugs. General instructions Schedule regular health, dental, and eye exams. Stay current with your vaccines. Tell your health care provider if: You often feel depressed. You have ever been abused or do not feel safe at home. Summary Adopting a healthy lifestyle and getting preventive care are important in promoting health and wellness. Follow your health care provider's instructions about healthy diet, exercising, and getting tested or screened for diseases. Follow your health care provider's instructions on monitoring your cholesterol and blood pressure. This information is not intended to replace advice given to you by your health care provider. Make sure you discuss any questions you have with your health care provider. Document Revised: 03/02/2021 Document Reviewed: 03/02/2021 Elsevier  Patient Education  2024 [employer].

## 2024-05-21 NOTE — Assessment & Plan Note (Addendum)
 Chronic Cough variant Saw allergy  in 2024 and started on Trelegy daily, albuterol  prn On allergy  medication Controlled

## 2024-05-21 NOTE — Assessment & Plan Note (Signed)
 Chronic Taking vitamin D  daily Check vitamin D  level Chronic Taking vitamin D  daily Check vitamin D  level

## 2024-05-21 NOTE — Assessment & Plan Note (Signed)
 Chronic Blood pressure controlled Continue diltiazem  360 mg daily, losartan -hydrochlorothiazide  100-25 mg daily CBC, CMP, lipids, tsh

## 2024-05-22 LAB — TSH: TSH: 0.8 u[IU]/mL (ref 0.35–5.50)

## 2024-05-22 LAB — VITAMIN D 25 HYDROXY (VIT D DEFICIENCY, FRACTURES): VITD: 18.65 ng/mL — ABNORMAL LOW (ref 30.00–100.00)

## 2024-05-22 LAB — PSA, MEDICARE: PSA: 0.49 ng/mL (ref 0.10–4.00)

## 2024-05-25 ENCOUNTER — Ambulatory Visit: Payer: Self-pay | Admitting: Internal Medicine

## 2024-05-25 MED ORDER — VITAMIN D (ERGOCALCIFEROL) 1.25 MG (50000 UNIT) PO CAPS: 50000.0000 [IU] | ORAL_CAPSULE | ORAL | 0 refills | Status: AC

## 2024-05-29 DIAGNOSIS — D2372 Other benign neoplasm of skin of left lower limb, including hip: Secondary | ICD-10-CM | POA: Diagnosis not present

## 2024-05-30 ENCOUNTER — Other Ambulatory Visit: Payer: Self-pay | Admitting: Internal Medicine

## 2024-07-16 ENCOUNTER — Other Ambulatory Visit: Payer: Self-pay | Admitting: Internal Medicine

## 2024-07-19 ENCOUNTER — Ambulatory Visit (INDEPENDENT_AMBULATORY_CARE_PROVIDER_SITE_OTHER)

## 2024-07-19 DIAGNOSIS — Z23 Encounter for immunization: Secondary | ICD-10-CM

## 2024-08-13 DIAGNOSIS — M3312 Other dermatopolymyositis with myopathy: Secondary | ICD-10-CM | POA: Diagnosis not present

## 2024-09-08 ENCOUNTER — Other Ambulatory Visit: Payer: Self-pay | Admitting: Internal Medicine

## 2024-09-17 NOTE — Progress Notes (Deleted)
 Office Visit Note  Patient: Evan Burns             Date of Birth: 11-23-1980           MRN: [id number]             PCP: Geofm Glade PARAS, MD Referring: Geofm Glade PARAS, MD Visit Date: 09/26/2024   Subjective:  No chief complaint on file.   History of Present Illness: Evan Burns is a 43 y.o. 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   Previous HPI 03/27/2024 Evan Burns is a 43 y.o. 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 He presents with worsening knee pain due to patellar tendonitis.   He experiences significant knee pain, described as 'crucial,' primarily located at the patellar tendon. The pain is severe enough to disturb his sleep at night and early morning. Various treatments have been attempted, including Voltaren  gel, which was ineffective, and a brace that provided some relief but was too bulky. Oral diclofenac  and steroids offered temporary relief, and a cortisone shot was administered but did not last as long as expected.   He experiences difficulty with certain movements, such as lifting his leg, which he attributes to weakness in the hip flexors. Occasionally, he experiences hip pain, though it is not as severe as the knee pain. The hip pain was more prominent when he first started swimming but has since decreased.   He sometimes experiences discomfort while driving, particularly in his wife's car, which he attributes to the angle of his knee during driving. He has not been kneeling on the affected knee frequently. He occasionally takes Aleve  for general aches, particularly after gym workouts, but tries to limit its use.   No significant changes in his skin, noting only some dryness and irritation during the winter.       Previous HPI 05/30/2023 Evan Burns is  a 43 y.o. 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  After last visit recommended evaluation at [facility] dermatology with Dr. Jertson but apparently appointment was never scheduled.  He is continue taking the meloxicam  7.5 mg with a pretty good benefit.  Still has right hip pain worse while weightbearing not severely limiting his activities.  He was seen for nodule swelling around the right eyelid had a biopsy for this in May consistent with calcinosis cutis and no recurrence since removal.  He is not having any pain with his other nodules unless he triggers this such as getting too much calcium.  More recently has complaint with right knee pain that has been acting up just in the past few weeks did not recall any preceding injury activity change and there is no associated swelling.   Previous HPI 09/13/2022 Evan Burns is a 43 y.o. 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  Currently has some right hip pain in the groin and down to the medial knee has not bothered him a bit more than usual for about 2 days.  Intermittently has worsening pain in this distribution.  The meloxicam  is pretty helpful but is usually taking less than once daily on average.  Skin rashes have remained pretty stable and he has not noticed specific new nodules.  Still having some changes at the lateral foot.  He has noticed a couple areas getting sensitive or having raised or irritated bumps at the right elbow nodules but no drainage or redness.   Previous HPI 06/14/2022 Evan Burns is a 43 y.o. male here for follow up for dermatomyositis with chronic calcinosis cutis currently on methotrexate  20 mg p.o. weekly folic acid  1 mg daily and is also on meloxicam  15 mg p.o. daily for right hip and knee joint pain.  Since starting the  meloxicam  he did notice improvement with his right hip pain actually feels like this was doing better and having more trouble of the knee than in the hip.  More recently had an episode where he he developed some left ankle pain and swelling but this resolved within a few weeks and not having a problem at the moment.  He had 1 episode of calcified nodule in the right elbow broke open wound was draining for about 3 days but this healed without any specific intervention.  Apparently his wife has noticed some increasing changes on the lateral side of his left foot but he does not notice any symptoms there.  Otherwise skin rash and nodules are about the same.   Previous HPI 10/02/2020 History of Present Illness: Evan Burns is a 43 y.o. male with a history of dermatomyositis here for evaluation of myalgias and skin problems. He has a long history of DM diagnosed around 20 years ago originally with muscle and skin disease activity. He was treated with steroids for this with apparent resolution of his muscle inflammation. Since that time he has had continued development of calcifications that sometimes become swollen or infected but no known recurrence of muscular involvement. He takes treatments for asthma but no known ILD. Today he complains of joint pain in multiple sites the right groin, left ankle, and right shoulder. He has had previous imaging of the shoulder and hip showing right shoulder bursitis and moderately advanced OA of the right hip. He has not had imaging for the left ankle pain. He does see swelling around the left ankle sometimes and worse after working all day. He has not noticed any focal weakness except around his right hip and thigh. He denies cough, dyspnea, fevers, weight loss, or lymphadenopathy.   No Rheumatology ROS completed.   PMFS History:  Patient Active Problem List   Diagnosis Date Noted   Infrapatellar bursitis of right knee 05/30/2023   Voice hoarseness 11/15/2022    Asthma exacerbation 11/02/2022   Occipital lymphadenopathy 09/28/2022   Cough variant asthma 09/01/2022   Seasonal and perennial allergic rhinitis 09/01/2022   Asthmatic bronchitis 08/23/2022   Facial rash 03/09/2022   COVID-19 01/08/2022   Vitamin D  deficiency 03/19/2021   Pain in left ankle and joints of left foot 10/02/2020   Calcinosis cutis 10/02/2020   Arthritis of right hip 09/16/2020   Tinea cruris 07/28/2020   Leg pain, bilateral 07/28/2020   Prediabetes 03/19/2020   Chest mass 02/14/2019   Superficial phlebitis, chest wall 02/14/2019   Fatigue 11/22/2018   Cough 11/08/2015   Possible GERD 10/29/2010   Essential hypertension 10/12/2010   Dermatomyositis (HCC) 06/17/2008   Asthma 02/22/2007    Past Medical History:  Diagnosis Date   Arthritis of right hip 09/16/2020   X-ray October 2021   Asthma    Dermatomyositis (HCC)    Hypertension  Family History  Problem Relation Age of Onset   Asthma Mother    Hypertension Mother    Diabetes Mother    Kidney disease Mother    Hyperlipidemia Mother    Arthritis Mother    Hypertension Father    Hyperlipidemia Father    Past Surgical History:  Procedure Laterality Date   HAND SURGERY     INCISION AND DRAINAGE OF WOUND     MASS EXCISION Left 07/27/2019   Procedure: LEFT LONG FINGER NODULE EXCISION;  Surgeon: Jerri Kay HERO, MD;  Location: [facility];  Service: Orthopedics;  Laterality: Left;   TYMPANOSTOMY TUBE PLACEMENT     Social History   Social History Narrative   Pt lives at home w/ parents       Very active at work - works two jobs   Immunization History  Administered Date(s) Administered   Influenza Split 08/25/2011, 07/25/2012   Influenza Whole 08/08/2007, 08/14/2008, 07/23/2009, 07/21/2010   Influenza, Mdck, Trivalent,PF 6+ MOS(egg free) 07/18/2023   Influenza, Seasonal, Injecte, Preservative Fre 07/19/2024   Influenza,inj,Quad PF,6+ Mos 07/24/2013, 07/23/2014, 07/01/2015, 07/05/2016,  08/06/2017, 08/04/2018, 09/07/2019, 07/28/2020, 07/03/2021, 07/26/2022   PFIZER Comirnaty(Gray Top)Covid-19 Tri-Sucrose Vaccine 08/06/2022   PFIZER(Purple Top)SARS-COV-2 Vaccination 01/07/2020, 01/28/2020, 09/22/2020   PNEUMOCOCCAL CONJUGATE-20 10/14/2023   Pfizer Covid-19 Vaccine Bivalent Booster 49yrs & up 08/04/2021   Pneumococcal Conjugate-13 09/21/2021   Pneumococcal Polysaccharide-23 10/01/2014   Tdap 09/13/2013, 04/18/2019     Objective: Vital Signs: There were no vitals taken for this visit.   Physical Exam   Musculoskeletal Exam: ***  CDAI Exam: CDAI Score: -- Patient Global: --; Provider Global: -- Swollen: --; Tender: -- Joint Exam 09/26/2024   No joint exam has been documented for this visit   There is currently no information documented on the homunculus. Go to the Rheumatology activity and complete the homunculus joint exam.  Investigation: No additional findings.  Imaging: No results found.  Recent Labs: Lab Results  Component Value Date   WBC 5.2 05/21/2024   HGB 13.4 05/21/2024   PLT 251.0 05/21/2024   NA 140 05/21/2024   K 3.8 05/21/2024   CL 101 05/21/2024   CO2 30 05/21/2024   GLUCOSE 87 05/21/2024   BUN 22 05/21/2024   CREATININE 1.11 05/21/2024   BILITOT 0.5 05/21/2024   ALKPHOS 40 05/21/2024   AST 27 05/21/2024   ALT 30 05/21/2024   PROT 6.7 05/21/2024   ALBUMIN 4.6 05/21/2024   CALCIUM 9.1 05/21/2024   GFRAA  02/01/2010    >60        The eGFR has been calculated using the MDRD equation. This calculation has not been validated in all clinical situations. eGFR's persistently <60 mL/min signify possible Chronic Kidney Disease.    Speciality Comments: No specialty comments available.  Procedures:  No procedures performed Allergies: Sulfonamide derivatives, Cephalexin, and Lisinopril   Assessment / Plan:     Visit Diagnoses: No diagnosis found.  ***  Orders: No orders of the defined types were placed in this  encounter.  No orders of the defined types were placed in this encounter.    Follow-Up Instructions: No follow-ups on file.   Kacey Vicuna M Calhoun Reichardt, CMA  Note - This record has been created using Animal nutritionist.  Chart creation errors have been sought, but may not always  have been located. Such creation errors do not reflect on  the standard of medical care.

## 2024-09-26 ENCOUNTER — Ambulatory Visit: Admitting: Internal Medicine

## 2024-09-26 DIAGNOSIS — M1611 Unilateral primary osteoarthritis, right hip: Secondary | ICD-10-CM

## 2024-09-26 DIAGNOSIS — M3313 Other dermatomyositis without myopathy: Secondary | ICD-10-CM

## 2024-10-10 ENCOUNTER — Other Ambulatory Visit: Payer: Self-pay | Admitting: Internal Medicine

## 2024-11-26 ENCOUNTER — Ambulatory Visit: Admitting: Internal Medicine

## 2024-11-28 NOTE — Progress Notes (Unsigned)
 "  Office Visit Note  Patient: Evan Burns             Date of Birth: [birth year]           MRN: [id number]             PCP: Geofm Glade PARAS, MD Referring: Geofm Glade PARAS, MD Visit Date: 12/10/2024   Subjective:  No chief complaint on file.   History of Present Illness: Evan Burns is a 44 y.o. 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    Previous HPI 03/27/2024 Evan Burns is a 44 y.o. 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 He presents with worsening knee pain due to patellar tendonitis.   He experiences significant knee pain, described as 'crucial,' primarily located at the patellar tendon. The pain is severe enough to disturb his sleep at night and early morning. Various treatments have been attempted, including Voltaren  gel, which was ineffective, and a brace that provided some relief but was too bulky. Oral diclofenac  and steroids offered temporary relief, and a cortisone shot was administered but did not last as long as expected.   He experiences difficulty with certain movements, such as lifting his leg, which he attributes to weakness in the hip flexors. Occasionally, he experiences hip pain, though it is not as severe as the knee pain. The hip pain was more prominent when he first started swimming but has since decreased.   He sometimes experiences discomfort while driving, particularly in his wife's car, which he attributes to the angle of his knee during driving. He has not been kneeling on the affected knee frequently. He occasionally takes Aleve  for general aches, particularly after gym workouts, but tries to limit its use.   No significant changes in his skin, noting only some dryness and irritation during the winter.       Previous HPI 05/30/2023 Evan Burns is  a 44 y.o. 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  After last visit recommended evaluation at [facility] dermatology with Dr. Jertson but apparently appointment was never scheduled.  He is continue taking the meloxicam  7.5 mg with a pretty good benefit.  Still has right hip pain worse while weightbearing not severely limiting his activities.  He was seen for nodule swelling around the right eyelid had a biopsy for this in May consistent with calcinosis cutis and no recurrence since removal.  He is not having any pain with his other nodules unless he triggers this such as getting too much calcium.  More recently has complaint with right knee pain that has been acting up just in the past few weeks did not recall any preceding injury activity change and there is no associated swelling.   Previous HPI 09/13/2022 Evan Burns is a 44 y.o. 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  Currently has some right hip pain in the groin and down to the medial knee has not bothered him a bit more than usual for about 2 days.  Intermittently has worsening pain in this distribution.  The meloxicam  is pretty helpful but is usually taking less than once daily on average.  Skin rashes have remained pretty stable and he has not noticed specific new nodules.  Still having some changes at the lateral foot.  He has noticed a couple areas getting sensitive or having raised or irritated bumps at the right elbow nodules but no drainage or redness.   Previous HPI 06/14/2022 Evan Burns is a 44 y.o. male here for follow up for dermatomyositis with chronic calcinosis cutis currently on methotrexate  20 mg p.o. weekly folic acid  1 mg daily and is also on meloxicam  15 mg p.o. daily for right hip and knee joint pain.  Since starting the  meloxicam  he did notice improvement with his right hip pain actually feels like this was doing better and having more trouble of the knee than in the hip.  More recently had an episode where he he developed some left ankle pain and swelling but this resolved within a few weeks and not having a problem at the moment.  He had 1 episode of calcified nodule in the right elbow broke open wound was draining for about 3 days but this healed without any specific intervention.  Apparently his wife has noticed some increasing changes on the lateral side of his left foot but he does not notice any symptoms there.  Otherwise skin rash and nodules are about the same.   Previous HPI 10/02/2020 History of Present Illness: Evan Burns is a 44 y.o. male with a history of dermatomyositis here for evaluation of myalgias and skin problems. He has a long history of DM diagnosed around 20 years ago originally with muscle and skin disease activity. He was treated with steroids for this with apparent resolution of his muscle inflammation. Since that time he has had continued development of calcifications that sometimes become swollen or infected but no known recurrence of muscular involvement. He takes treatments for asthma but no known ILD. Today he complains of joint pain in multiple sites the right groin, left ankle, and right shoulder. He has had previous imaging of the shoulder and hip showing right shoulder bursitis and moderately advanced OA of the right hip. He has not had imaging for the left ankle pain. He does see swelling around the left ankle sometimes and worse after working all day. He has not noticed any focal weakness except around his right hip and thigh. He denies cough, dyspnea, fevers, weight loss, or lymphadenopathy.   No Rheumatology ROS completed.   PMFS History:  Patient Active Problem List   Diagnosis Date Noted   Infrapatellar bursitis of right knee 05/30/2023   Voice hoarseness 11/15/2022    Asthma exacerbation 11/02/2022   Occipital lymphadenopathy 09/28/2022   Cough variant asthma 09/01/2022   Seasonal and perennial allergic rhinitis 09/01/2022   Asthmatic bronchitis 08/23/2022   Facial rash 03/09/2022   COVID-19 01/08/2022   Vitamin D  deficiency 03/19/2021   Pain in left ankle and joints of left foot 10/02/2020   Calcinosis cutis 10/02/2020   Arthritis of right hip 09/16/2020   Tinea cruris 07/28/2020   Leg pain, bilateral 07/28/2020   Prediabetes 03/19/2020   Chest mass 02/14/2019   Superficial phlebitis, chest wall 02/14/2019   Fatigue 11/22/2018   Cough 11/08/2015   Possible GERD 10/29/2010   Essential hypertension 10/12/2010   Dermatomyositis (HCC) 06/17/2008   Asthma 02/22/2007    Past Medical History:  Diagnosis Date   Arthritis of right hip 09/16/2020   X-ray October 2021   Asthma    Dermatomyositis (HCC)    Hypertension  Family History  Problem Relation Age of Onset   Asthma Mother    Hypertension Mother    Diabetes Mother    Kidney disease Mother    Hyperlipidemia Mother    Arthritis Mother    Hypertension Father    Hyperlipidemia Father    Past Surgical History:  Procedure Laterality Date   HAND SURGERY     INCISION AND DRAINAGE OF WOUND     MASS EXCISION Left 07/27/2019   Procedure: LEFT LONG FINGER NODULE EXCISION;  Surgeon: Jerri Kay HERO, MD;  Location: [facility];  Service: Orthopedics;  Laterality: Left;   TYMPANOSTOMY TUBE PLACEMENT     Social History   Social History Narrative   Pt lives at home w/ parents       Very active at work - works two jobs   Immunization History  Administered Date(s) Administered   Influenza Split 08/25/2011, 07/25/2012   Influenza Whole 08/08/2007, 08/14/2008, 07/23/2009, 07/21/2010   Influenza, Mdck, Trivalent,PF 6+ MOS(egg free) 07/18/2023   Influenza, Seasonal, Injecte, Preservative Fre 07/19/2024   Influenza,inj,Quad PF,6+ Mos 07/24/2013, 07/23/2014, 07/01/2015, 07/05/2016,  08/06/2017, 08/04/2018, 09/07/2019, 07/28/2020, 07/03/2021, 07/26/2022   PFIZER Comirnaty(Gray Top)Covid-19 Tri-Sucrose Vaccine 08/06/2022   PFIZER(Purple Top)SARS-COV-2 Vaccination 01/07/2020, 01/28/2020, 09/22/2020   PNEUMOCOCCAL CONJUGATE-20 10/14/2023   Pfizer Covid-19 Vaccine Bivalent Booster 32yrs & up 08/04/2021   Pfizer(Comirnaty)Fall Seasonal Vaccine 12 years and older 10/22/2024   Pneumococcal Conjugate-13 09/21/2021   Pneumococcal Polysaccharide-23 10/01/2014   Tdap 09/13/2013, 04/18/2019     Objective: Vital Signs: There were no vitals taken for this visit.   Physical Exam   Musculoskeletal Exam: ***   Investigation: No additional findings.  Imaging: No results found.  Recent Labs: Lab Results  Component Value Date   WBC 5.2 05/21/2024   HGB 13.4 05/21/2024   PLT 251.0 05/21/2024   NA 140 05/21/2024   K 3.8 05/21/2024   CL 101 05/21/2024   CO2 30 05/21/2024   GLUCOSE 87 05/21/2024   BUN 22 05/21/2024   CREATININE 1.11 05/21/2024   BILITOT 0.5 05/21/2024   ALKPHOS 40 05/21/2024   AST 27 05/21/2024   ALT 30 05/21/2024   PROT 6.7 05/21/2024   ALBUMIN 4.6 05/21/2024   CALCIUM 9.1 05/21/2024   GFRAA  02/01/2010    >60        The eGFR has been calculated using the MDRD equation. This calculation has not been validated in all clinical situations. eGFR's persistently <60 mL/min signify possible Chronic Kidney Disease.    Speciality Comments: No specialty comments available.  Procedures:  No procedures performed Allergies: Sulfonamide derivatives, Cephalexin, and Lisinopril   Assessment / Plan:     Visit Diagnoses:  Assessment & Plan Dermatomyositis (HCC)      ***  Follow-Up Instructions: No follow-ups on file.   Juline Sanderford M Somnang Mahan, CMA  Note - This record has been created using Animal nutritionist.  Chart creation errors have been sought, but may not always  have been located. Such creation errors do not reflect on  the standard of  medical care. "

## 2024-11-28 NOTE — Assessment & Plan Note (Signed)
 SABRA

## 2024-12-10 ENCOUNTER — Ambulatory Visit: Admitting: Internal Medicine

## 2024-12-10 DIAGNOSIS — M3313 Other dermatomyositis without myopathy: Secondary | ICD-10-CM
